# Patient Record
Sex: Female | Born: 1960 | Race: Black or African American | Hispanic: No | Smoking: Never smoker
Health system: Southern US, Community
[De-identification: ages and names within clinical notes are randomized; demographics above are authoritative.]

## PROBLEM LIST (undated history)

## (undated) DIAGNOSIS — R197 Diarrhea, unspecified: Secondary | ICD-10-CM

## (undated) DIAGNOSIS — K219 Gastro-esophageal reflux disease without esophagitis: Secondary | ICD-10-CM

## (undated) DIAGNOSIS — E782 Mixed hyperlipidemia: Secondary | ICD-10-CM

## (undated) DIAGNOSIS — M1990 Unspecified osteoarthritis, unspecified site: Secondary | ICD-10-CM

## (undated) DIAGNOSIS — E569 Vitamin deficiency, unspecified: Secondary | ICD-10-CM

## (undated) DIAGNOSIS — G4700 Insomnia, unspecified: Secondary | ICD-10-CM

## (undated) HISTORY — PX: ABDOMINAL SURGERY: SHX537

## (undated) HISTORY — DX: Insomnia, unspecified: G47.00

## (undated) HISTORY — PX: COLON SURGERY: SHX602

## (undated) HISTORY — DX: Gastro-esophageal reflux disease without esophagitis: K21.9

## (undated) HISTORY — DX: Diarrhea, unspecified: R19.7

## (undated) HISTORY — DX: Unspecified osteoarthritis, unspecified site: M19.90

---
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
## 1999-09-10 ENCOUNTER — Emergency Department (HOSPITAL_COMMUNITY)
Admission: EM | Admit: 1999-09-10 | Discharge: 1999-09-10 | Payer: Self-pay | Source: Ambulatory Visit | Admitting: Emergency Medicine

## 2001-07-22 ENCOUNTER — Emergency Department (HOSPITAL_COMMUNITY): Admission: EM | Admit: 2001-07-22 | Discharge: 2001-07-22 | Payer: Self-pay

## 2001-07-22 ENCOUNTER — Encounter: Payer: Self-pay | Admitting: Emergency Medicine

## 2004-01-09 ENCOUNTER — Emergency Department (HOSPITAL_COMMUNITY): Admission: EM | Admit: 2004-01-09 | Discharge: 2004-01-09 | Payer: Self-pay | Admitting: Emergency Medicine

## 2004-10-22 ENCOUNTER — Emergency Department (HOSPITAL_COMMUNITY): Admission: EM | Admit: 2004-10-22 | Discharge: 2004-10-22 | Payer: Self-pay | Admitting: Emergency Medicine

## 2004-12-18 ENCOUNTER — Ambulatory Visit (HOSPITAL_COMMUNITY): Admission: RE | Admit: 2004-12-18 | Discharge: 2004-12-18 | Payer: Self-pay | Admitting: Obstetrics and Gynecology

## 2005-01-30 ENCOUNTER — Ambulatory Visit (HOSPITAL_COMMUNITY): Admission: RE | Admit: 2005-01-30 | Discharge: 2005-01-30 | Payer: Self-pay | Admitting: Obstetrics and Gynecology

## 2006-12-24 ENCOUNTER — Ambulatory Visit (HOSPITAL_COMMUNITY): Admission: RE | Admit: 2006-12-24 | Discharge: 2006-12-24 | Payer: Self-pay | Admitting: Obstetrics and Gynecology

## 2007-12-25 ENCOUNTER — Emergency Department (HOSPITAL_COMMUNITY): Admission: EM | Admit: 2007-12-25 | Discharge: 2007-12-25 | Payer: Self-pay | Admitting: Emergency Medicine

## 2007-12-28 ENCOUNTER — Emergency Department (HOSPITAL_COMMUNITY): Admission: EM | Admit: 2007-12-28 | Discharge: 2007-12-28 | Payer: Self-pay | Admitting: Emergency Medicine

## 2008-10-26 ENCOUNTER — Emergency Department (HOSPITAL_COMMUNITY): Admission: EM | Admit: 2008-10-26 | Discharge: 2008-10-26 | Payer: Self-pay | Admitting: Emergency Medicine

## 2008-12-17 ENCOUNTER — Emergency Department (HOSPITAL_COMMUNITY): Admission: EM | Admit: 2008-12-17 | Discharge: 2008-12-18 | Payer: Self-pay | Admitting: Emergency Medicine

## 2008-12-18 ENCOUNTER — Inpatient Hospital Stay (HOSPITAL_COMMUNITY): Admission: EM | Admit: 2008-12-18 | Discharge: 2009-01-04 | Payer: Self-pay | Admitting: Emergency Medicine

## 2008-12-18 ENCOUNTER — Ambulatory Visit: Payer: Self-pay | Admitting: Family Medicine

## 2008-12-20 ENCOUNTER — Encounter: Payer: Self-pay | Admitting: Gastroenterology

## 2008-12-22 ENCOUNTER — Ambulatory Visit: Payer: Self-pay | Admitting: Gastroenterology

## 2008-12-24 ENCOUNTER — Encounter: Payer: Self-pay | Admitting: Gastroenterology

## 2009-01-06 ENCOUNTER — Encounter: Payer: Self-pay | Admitting: Family Medicine

## 2009-01-13 ENCOUNTER — Ambulatory Visit (HOSPITAL_COMMUNITY): Admission: RE | Admit: 2009-01-13 | Discharge: 2009-01-13 | Payer: Self-pay | Admitting: Interventional Radiology

## 2009-01-19 ENCOUNTER — Telehealth: Payer: Self-pay | Admitting: *Deleted

## 2009-01-24 ENCOUNTER — Ambulatory Visit: Payer: Self-pay | Admitting: Family Medicine

## 2009-01-24 ENCOUNTER — Encounter: Payer: Self-pay | Admitting: Family Medicine

## 2009-01-24 DIAGNOSIS — D649 Anemia, unspecified: Secondary | ICD-10-CM

## 2009-01-24 DIAGNOSIS — K5289 Other specified noninfective gastroenteritis and colitis: Secondary | ICD-10-CM | POA: Insufficient documentation

## 2009-01-24 DIAGNOSIS — M549 Dorsalgia, unspecified: Secondary | ICD-10-CM | POA: Insufficient documentation

## 2009-01-24 LAB — CONVERTED CEMR LAB
Eosinophils Absolute: 0 10*3/uL (ref 0.0–0.7)
Eosinophils Relative: 0 % (ref 0–5)
HCT: 35.4 % — ABNORMAL LOW (ref 36.0–46.0)
Hemoglobin: 10.8 g/dL — ABNORMAL LOW (ref 12.0–15.0)
Lymphs Abs: 2.8 10*3/uL (ref 0.7–4.0)
MCHC: 30.5 g/dL (ref 30.0–36.0)
MCV: 101.4 fL — ABNORMAL HIGH (ref 78.0–100.0)
Monocytes Absolute: 0.6 10*3/uL (ref 0.1–1.0)
Monocytes Relative: 8 % (ref 3–12)
Neutrophils Relative %: 51 % (ref 43–77)
RBC: 3.49 M/uL — ABNORMAL LOW (ref 3.87–5.11)
WBC: 6.9 10*3/uL (ref 4.0–10.5)

## 2009-01-30 ENCOUNTER — Telehealth: Payer: Self-pay | Admitting: Family Medicine

## 2009-01-31 ENCOUNTER — Telehealth: Payer: Self-pay | Admitting: *Deleted

## 2009-02-02 ENCOUNTER — Encounter: Payer: Self-pay | Admitting: *Deleted

## 2009-03-02 ENCOUNTER — Telehealth: Payer: Self-pay | Admitting: Gastroenterology

## 2009-06-02 ENCOUNTER — Telehealth: Payer: Self-pay | Admitting: Family Medicine

## 2009-06-06 ENCOUNTER — Ambulatory Visit: Payer: Self-pay | Admitting: Family Medicine

## 2009-06-06 ENCOUNTER — Encounter: Payer: Self-pay | Admitting: Family Medicine

## 2009-06-06 LAB — CONVERTED CEMR LAB
Basophils Absolute: 0 10*3/uL (ref 0.0–0.1)
Eosinophils Absolute: 0.1 10*3/uL (ref 0.0–0.7)
Eosinophils Relative: 2 % (ref 0–5)
HCT: 38 % (ref 36.0–46.0)
Hemoglobin: 12.4 g/dL (ref 12.0–15.0)
Lymphocytes Relative: 35 % (ref 12–46)
MCV: 92.5 fL (ref 78.0–100.0)
Monocytes Absolute: 0.4 10*3/uL (ref 0.1–1.0)
RDW: 13.8 % (ref 11.5–15.5)

## 2009-07-24 ENCOUNTER — Telehealth: Payer: Self-pay | Admitting: Sports Medicine

## 2009-07-24 ENCOUNTER — Telehealth: Payer: Self-pay | Admitting: *Deleted

## 2009-07-24 ENCOUNTER — Encounter: Admission: RE | Admit: 2009-07-24 | Discharge: 2009-07-24 | Payer: Self-pay | Admitting: Sports Medicine

## 2009-07-24 ENCOUNTER — Ambulatory Visit: Payer: Self-pay | Admitting: Family Medicine

## 2009-07-24 ENCOUNTER — Encounter: Payer: Self-pay | Admitting: Sports Medicine

## 2009-07-24 DIAGNOSIS — R109 Unspecified abdominal pain: Secondary | ICD-10-CM | POA: Insufficient documentation

## 2009-07-24 LAB — CONVERTED CEMR LAB
ALT: 11 units/L (ref 0–35)
AST: 10 U/L (ref 0–37)
Albumin: 4.3 g/dL (ref 3.5–5.2)
Alkaline Phosphatase: 80 U/L (ref 39–117)
BUN: 18 mg/dL (ref 6–23)
Basophils Absolute: 0 K/uL (ref 0.0–0.1)
Basophils Relative: 0 % (ref 0–1)
Bilirubin Urine: NEGATIVE
Blood in Urine, dipstick: NEGATIVE
CO2: 24 meq/L (ref 19–32)
Calcium: 9.7 mg/dL (ref 8.4–10.5)
Chloride: 105 meq/L (ref 96–112)
Creatinine, Ser: 0.65 mg/dL (ref 0.40–1.20)
Eosinophils Absolute: 0.1 10*3/uL (ref 0.0–0.7)
Eosinophils Relative: 2 % (ref 0–5)
Glucose, Bld: 98 mg/dL (ref 70–99)
Glucose, Urine, Semiquant: NEGATIVE
HCT: 37.9 % (ref 36.0–46.0)
Hemoglobin: 12.4 g/dL (ref 12.0–15.0)
Ketones, urine, test strip: NEGATIVE
Lipase: 26 units/L (ref 0–75)
Lymphocytes Relative: 25 % (ref 12–46)
Lymphs Abs: 1.5 10*3/uL (ref 0.7–4.0)
MCHC: 32.7 g/dL (ref 30.0–36.0)
MCV: 96.9 fL (ref 78.0–100.0)
Monocytes Absolute: 0.4 K/uL (ref 0.1–1.0)
Monocytes Relative: 7 % (ref 3–12)
Neutro Abs: 4 K/uL (ref 1.7–7.7)
Neutrophils Relative %: 66 % (ref 43–77)
Nitrite: NEGATIVE
Platelets: 289 K/uL (ref 150–400)
Potassium: 3.6 meq/L (ref 3.5–5.3)
RBC: 3.91 M/uL (ref 3.87–5.11)
RDW: 13.8 % (ref 11.5–15.5)
Sodium: 140 meq/L (ref 135–145)
Specific Gravity, Urine: >=1.03
Total Bilirubin: 0.3 mg/dL (ref 0.3–1.2)
Total Protein: 7.2 g/dL (ref 6.0–8.3)
Urobilinogen, UA: 1
WBC Urine, dipstick: NEGATIVE
WBC: 6.1 10*3/uL (ref 4.0–10.5)
pH: 6

## 2009-07-27 ENCOUNTER — Encounter: Admission: RE | Admit: 2009-07-27 | Discharge: 2009-07-27 | Payer: Self-pay | Admitting: Sports Medicine

## 2009-08-14 ENCOUNTER — Emergency Department (HOSPITAL_COMMUNITY): Admission: EM | Admit: 2009-08-14 | Discharge: 2009-08-14 | Payer: Self-pay | Admitting: Emergency Medicine

## 2009-11-30 ENCOUNTER — Emergency Department (HOSPITAL_COMMUNITY): Admission: EM | Admit: 2009-11-30 | Discharge: 2009-11-30 | Payer: Self-pay | Admitting: Emergency Medicine

## 2010-05-27 ENCOUNTER — Encounter: Payer: Self-pay | Admitting: Obstetrics and Gynecology

## 2010-05-28 ENCOUNTER — Encounter: Payer: Self-pay | Admitting: General Surgery

## 2010-06-05 NOTE — Assessment & Plan Note (Signed)
Summary: RLQ pain/[place]/williams   Vital Signs:  Patient profile:   50 year old female Weight:      184 pounds Temp:     98.1 degrees F oral Pulse rate:   92 / minute BP sitting:   130 / 84  (left arm)  Vitals Entered By: Audelia Hives CMA (July 24, 2009 2:00 PM)  Primary Care Provider:  Elsie Lincoln MD   History of Present Illness: 23F with hx colonoscopy proven UC, hx intraabdominal abscesses in Aug 2010 that reqd CT drainage, now with acute abd pain.  Started 2 days ago, acute onset, suprapubic and LLQ pain,  7/10, no N/V/D/C, dark stools, taking mesalamine, followed by [facility], Dr. Paulita Fujita, Able to walk in to office.  No radiation of pain.  Has appt with Dr. Paulita Fujita in 2 days.  Passing flatus/stool. No dysuria, no flank pain, no vag discharge.  Note, was a pt of Dr. Fuller Plan with [facility] but "didn't like their personality," so switched to [facility].  Refuses to do steroid enemas even if needed.  Current Medications (verified): 1)  Asacol 400 Mg Tbec (Mesalamine) .... By Mouth Three Times A Day 2)  Tramadol Hcl 50 Mg Tabs (Tramadol Hcl) .... Take Up To Two Times A Day As Needed For Pain  Allergies (verified): 1)  ! 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  Review of Systems       See HPI  Physical Exam  General:  Well-developed,well-nourished,in no acute distress; alert,appropriate and cooperative throughout examination Lungs:  Normal respiratory effort, chest expands symmetrically. Lungs are clear to auscultation, no crackles or wheezes. Heart:  Normal rate and regular rhythm. S1 and S2 normal without gallop, murmur, click, rub or other extra sounds. Abdomen:  Soft, obese, TTP suprapubic and LLQ, no rebound, mild guarding, no flank tenderness, good BS. Extremities:  No clubbing, cyanosis, edema, or deformity noted with normal full range of motion of all joints.     Impression & Recommendations:  Problem # 1:  ABDOMINAL PAIN (ICD-789.00) Assessment New Acute abd pain with neg UA, with hx abscesses, hx IBD, will check CBC, CMET, lipase, CT abd/pelv with by mouth and IV contrast to see if further abscesses are present.  If abscesses present would admit for CT drainage and IV abx, if only colitis present would cont mesalamine and by mouth steroid burst.  Her updated medication list for this problem includes:    Asacol 400 Mg Tbec (Mesalamine) ..... By mouth three times a day  Orders: Comp Met-[facility] [phone]) CBC w/Diff-[facility] (63817) Lipase-[facility] ([id number]) CT with Contrast (CT w/ contrast) [facility]- Est  Level 4 (33383)  Problem # 2:  IBD (ICD-558.9) Assessment: Deteriorated Cont mesalamine.  See #1.  Orders: [place]- Est  Level 4 (29191)  Problem # 3:  UNSPECIFIED ANEMIA (ICD-285.9) Assessment: Unchanged Will follow  up with CBC, likely 2/2 GI loses with UC.  Orders: [place]- Est  Level 4 (99214)  Complete Medication List: 1)  Asacol 400 Mg Tbec (Mesalamine) .... By mouth three times a day 2)  Tramadol Hcl 50 Mg Tabs (Tramadol hcl) .... Take up to two times a day as needed for pain  Other Orders: Urinalysis-[facility] (00000)  Patient Instructions: 1)  CT of your belly 2)  Bloodwork in office 3)  Will call you with  results if any further medications needed, otherwise continue to take your mesalamine. 4)  -Dr. Darene Lamer.  Laboratory Results   Urine Tests  Date/Time Received: July 24, 2009 2:03 PM  Date/Time Reported: July 24, 2009 2:12 PM   Routine Urinalysis   Color: yellow Appearance: Clear Glucose: negative   (Normal Range: Negative) Bilirubin: negative   (Normal Range: Negative) Ketone: negative   (Normal Range: Negative) Spec. Gravity: >=1.030   (Normal Range: 1.003-1.035) Blood: negative   (Normal Range: Negative) pH: 6.0   (Normal Range: 5.0-8.0) Protein: trace   (Normal Range: Negative) Urobilinogen: 1.0   (Normal Range: 0-1) Nitrite: negative   (Normal Range: Negative) Leukocyte Esterace: negative   (Normal Range: Negative)    Comments: ...........test performed by...........Marland KitchenHedy Camara, CMA

## 2010-06-05 NOTE — Assessment & Plan Note (Signed)
Summary: f/up,tcb   Vital Signs:  Patient profile:   50 year old female Weight:      179 pounds Temp:     98 degrees F oral Pulse rate:   98 / minute Pulse rhythm:   regular BP sitting:   119 / 80  (right arm) Cuff size:   regular  Vitals Entered By: Loralee Pacas CMA (June 06, 2009 2:32 PM)  Primary Care Provider:  Magnus Ivan MD  CC:  f/u IBD and anemia.  History of Present Illness: IBD: pt still experiencing intermittent stomach pains, especially on the R side of abdomen.  tramadol and ibuprofen make pain better.  drinking milk mains pain worse.   pt has noticed blood in her stool in the last week (last Thursday).  she notices it when she wipes herself.  pt not constipated and bowels are moving properly (5 times a week).    anemia:   was concerned about anemia on last visit due to symptoms, low hemoglobin, and murmur on last exam (and low hemoglobin also on hospital discharge).  patient still experiencing fatigue, dizziness (4-5 times per week).   Allergies (verified): 1)  ! Reglan  Past History:  Past Medical History: Last updated: 01/24/2009 IBD (ulcerative colitis by pathology and possible Crohn's because of inflammation of small bowel on CT as an inpatient) possible anemia back pain cholesterol done in 2008 tetanus done in 2007  Review of Systems       as per hpi  Physical Exam  General:  NAD well-developed and well-nourished.   vital signs noted and wnl Heart:  II/VI systolic murmur Abdomen:  hypoactive bowel sounds, R sided soreness to palpation of abdomen Pulses:  R radial normal and L radial normal.  2+  Extremities:  no lower extremity edema.   Impression & Recommendations:  Problem # 1:  IBD (ICD-558.9) Assessment Deteriorated  Patient has not followed up with GI doctor since hospital release in September.  Pt did not have a good experience with certain parts of [facility] practice.  I am concerned that stomach pain (even though managed  with tramadol) is due to not having proper follow up for IBD.  Also concerned about blood in her stool.  Will have her f/u with [facility] in the next 2 weeks-month if possible.  Orders: [facility]- Est  Level 4 (29562) Gastroenterology Referral (GI)  Problem # 2:  UNSPECIFIED ANEMIA (ICD-285.9) Assessment: Deteriorated Do not know exact etiology of patient's anemia.  Differential includes GI bleeding, menstrual history (yet unsure of menstrual history), etc.  Decided to recheck a CBC today to ensure that hemoglobin is not dropping.  CBC reveals that hemoglobin is wnl at this visit.  Will revisit iron in the future if hemoglobin goes down again. Orders: CBC w/Diff-[facility] (13086) [facility]- Est  Level 4 (99214)  Complete Medication List: 1)  Asacol 400 Mg Tbec (Mesalamine) .... By mouth three times a day 2)  Tramadol Hcl 50 Mg Tabs (Tramadol hcl) .... Take up to two times a day as needed for pain  Patient Instructions: 1)  It was good to see you today Ms Jerlean!  I'm sorry that you are still having stomach pain and did not like [facility].  We are going to refer you to [facility].  Please let us know as soon as possible if you do not like them. 2)  I am refilling your TRAMADOL and I am sending you a prescription for iron. 3)  Follow up with me  once you have seen [facility]. 4)  Thank you and be blessed! Prescriptions: TRAMADOL HCL 50 MG TABS (TRAMADOL HCL) take up to two times a day as needed for pain  #21 x 0   Entered and Authorized by:   Magnus Ivan MD   Signed by:   Magnus Ivan MD on 06/06/2009   Method used:   Electronically to        [facility] [phone]* (retail)       [address]       [place], [place]  [address]       Ph: [phone] or [phone]       Fax: [phone]   RxID:   [id number]

## 2010-06-05 NOTE — Progress Notes (Signed)
Summary: triage  Phone Note Call from Patient Call back at Home Phone [phone]   Caller: Patient Summary of Call: pt is having stomach pains and is not sure if she should come here or go back to GI doc. Initial call taken by: De Nurse,  July 24, 2009 8:47 AM  Follow-up for Phone Call        LM for her to call back. (can see her now) Follow-up by: Golden Circle RN,  July 24, 2009 8:53 AM  Additional Follow-up for Phone Call Additional follow up Details #1::        unable to come until 1:30. will see Dr. Karie Schwalbe. states pain is 7/10. has not taken anything for this. had some gas passed but no change in how she feels.  states she has a hx of abcesses there. Additional Follow-up by: Golden Circle RN,  July 24, 2009 8:59 AM

## 2010-06-05 NOTE — Progress Notes (Signed)
Summary: Follow up of CT results.  Phone Note Outgoing Call   Call placed by: Rodney Langton MD,  July 24, 2009 5:53 PM Call placed to: Patient Summary of Call: CT results conveyed, pt happy, informed that she will need [place] to eval ovarian cysts.  Will follow.

## 2010-06-05 NOTE — Progress Notes (Signed)
Summary: triage  Phone Note Call from Patient Call back at Home Phone [phone]   Caller: Patient Summary of Call: Informed pt that Dr. Mayford Knife wanted to wait till she saw her to fill Tramadol.  Pt understood, but wondering what she can take over the counter instead for pain. Initial call taken by: Clydell Hakim,  June 02, 2009 3:42 PM  Follow-up for Phone Call        advised patient that RN will send message to MD to ask what she can take OTC. she has been taking ibuprofen some but she is unsure if MD wants her to continue this. has an appointment with MD 06/06/2009. Follow-up by: Theresia Lo RN,  June 02, 2009 3:46 PM  Additional Follow-up for Phone Call Additional follow up Details #1::        refilled tramadol at visit today but will definitely want her to have a follow up with [facility] to manage her IBD just in case her pain needs to be managed some other way. Additional Follow-up by: Magnus Ivan MD,  June 06, 2009 8:33 PM

## 2010-06-05 NOTE — Progress Notes (Signed)
Summary: Rx Req  Phone Note Refill Request Call back at Home Phone [phone] Message from:  Patient  Refills Requested: Medication #1:  TRAMADOL HCL 50 MG TABS take up to two times a day as needed for pain. PT USES [facility] ON [address].  PT HAS F/UP APPT ON 06/06/09.  Initial call taken by: Clydell Hakim,  June 02, 2009 9:32 AM Caller: Patient  Follow-up for Phone Call        will forward to MD. Follow-up by: Theresia Lo RN,  June 02, 2009 10:55 AM  Additional Follow-up for Phone Call Additional follow up Details #1::        will send message to triage that pt needs to come see  me because honestly i don't really remember why we still have her on tramadol. Additional Follow-up by: Magnus Ivan MD,  June 02, 2009 2:07 PM     Appended Document: Rx Req actually patient has an appointment with me next week.  i want to readdress why she is still taking the tramadol.

## 2010-07-21 LAB — CBC
Hemoglobin: 12 g/dL (ref 12.0–15.0)
MCHC: 34.2 g/dL (ref 30.0–36.0)
RBC: 3.61 MIL/uL — ABNORMAL LOW (ref 3.87–5.11)

## 2010-07-21 LAB — DIFFERENTIAL
Eosinophils Absolute: 0.1 10*3/uL (ref 0.0–0.7)
Eosinophils Relative: 2 % (ref 0–5)
Lymphs Abs: 1.3 10*3/uL (ref 0.7–4.0)
Monocytes Absolute: 0.4 10*3/uL (ref 0.1–1.0)
Monocytes Relative: 10 % (ref 3–12)

## 2010-07-21 LAB — COMPREHENSIVE METABOLIC PANEL
ALT: 14 U/L (ref 0–35)
AST: 14 U/L (ref 0–37)
CO2: 24 mEq/L (ref 19–32)
Calcium: 8.7 mg/dL (ref 8.4–10.5)
GFR calc Af Amer: >60 mL/min (ref >60)
GFR calc non Af Amer: >60 mL/min (ref >60)
Sodium: 139 mEq/L (ref 135–145)

## 2010-07-21 LAB — URINALYSIS, ROUTINE W REFLEX MICROSCOPIC
Bilirubin Urine: NEGATIVE
Glucose, UA: NEGATIVE mg/dL
Hgb urine dipstick: NEGATIVE
Ketones, ur: NEGATIVE mg/dL
pH: 7 (ref 5.0–8.0)

## 2010-07-21 LAB — LIPASE, BLOOD: Lipase: 29 U/L (ref 11–59)

## 2010-07-25 LAB — URINE MICROSCOPIC-ADD ON

## 2010-07-25 LAB — CBC
Platelets: 308 10*3/uL (ref 150–400)
RBC: 4.05 MIL/uL (ref 3.87–5.11)
WBC: 5.1 10*3/uL (ref 4.0–10.5)

## 2010-07-25 LAB — COMPREHENSIVE METABOLIC PANEL
ALT: 16 U/L (ref 0–35)
AST: 17 U/L (ref 0–37)
Albumin: 4.1 g/dL (ref 3.5–5.2)
CO2: 25 mEq/L (ref 19–32)
Calcium: 9.7 mg/dL (ref 8.4–10.5)
Chloride: 108 mEq/L (ref 96–112)
GFR calc Af Amer: >60 mL/min (ref >60)
GFR calc non Af Amer: >60 mL/min (ref >60)
Sodium: 139 mEq/L (ref 135–145)

## 2010-07-25 LAB — DIFFERENTIAL
Eosinophils Absolute: 0.1 10*3/uL (ref 0.0–0.7)
Eosinophils Relative: 1 % (ref 0–5)
Lymphocytes Relative: 30 % (ref 12–46)
Lymphs Abs: 1.5 10*3/uL (ref 0.7–4.0)
Monocytes Absolute: 0.2 10*3/uL (ref 0.1–1.0)

## 2010-07-25 LAB — URINALYSIS, ROUTINE W REFLEX MICROSCOPIC
Glucose, UA: NEGATIVE mg/dL
Hgb urine dipstick: NEGATIVE
Specific Gravity, Urine: 1.025 (ref 1.005–1.030)
pH: 7.5 (ref 5.0–8.0)

## 2010-08-10 LAB — GLUCOSE, CAPILLARY: Glucose-Capillary: 109 mg/dL — ABNORMAL HIGH (ref 70–99)

## 2010-08-11 LAB — DIFFERENTIAL
Band Neutrophils: 0 % (ref 0–10)
Basophils Absolute: 0 10*3/uL (ref 0.0–0.1)
Basophils Absolute: 0 K/uL (ref 0.0–0.1)
Basophils Absolute: 0 K/uL (ref 0.0–0.1)
Basophils Absolute: 0.1 K/uL (ref 0.0–0.1)
Basophils Absolute: 0.1 10*3/uL (ref 0.0–0.1)
Basophils Relative: 0 % (ref 0–1)
Basophils Relative: 0 % (ref 0–1)
Basophils Relative: 0 % (ref 0–1)
Basophils Relative: 1 % (ref 0–1)
Eosinophils Absolute: 0 10*3/uL (ref 0.0–0.7)
Eosinophils Absolute: 0 K/uL (ref 0.0–0.7)
Eosinophils Absolute: 0 K/uL (ref 0.0–0.7)
Eosinophils Absolute: 0 K/uL (ref 0.0–0.7)
Eosinophils Absolute: 0 10*3/uL (ref 0.0–0.7)
Eosinophils Relative: 0 % (ref 0–5)
Eosinophils Relative: 0 % (ref 0–5)
Eosinophils Relative: 0 % (ref 0–5)
Eosinophils Relative: 0 % (ref 0–5)
Lymphocytes Relative: 4 % — ABNORMAL LOW (ref 12–46)
Lymphocytes Relative: 5 % — ABNORMAL LOW (ref 12–46)
Lymphocytes Relative: 6 % — ABNORMAL LOW (ref 12–46)
Lymphocytes Relative: 8 % — ABNORMAL LOW (ref 12–46)
Lymphs Abs: 1.2 K/uL (ref 0.7–4.0)
Lymphs Abs: 1.3 10*3/uL (ref 0.7–4.0)
Lymphs Abs: 1.6 K/uL (ref 0.7–4.0)
Lymphs Abs: 1.6 K/uL (ref 0.7–4.0)
Lymphs Abs: 1.9 10*3/uL (ref 0.7–4.0)
Monocytes Absolute: 0.3 K/uL (ref 0.1–1.0)
Monocytes Absolute: 0.6 K/uL (ref 0.1–1.0)
Monocytes Absolute: 1 K/uL (ref 0.1–1.0)
Monocytes Relative: 1 % — ABNORMAL LOW (ref 3–12)
Monocytes Relative: 2 % — ABNORMAL LOW (ref 3–12)
Monocytes Relative: 3 % (ref 3–12)
Monocytes Relative: 5 % (ref 3–12)
Myelocytes: 0 %
Neutro Abs: 18 K/uL — ABNORMAL HIGH (ref 1.7–7.7)
Neutro Abs: 21 K/uL — ABNORMAL HIGH (ref 1.7–7.7)
Neutro Abs: 24.3 K/uL — ABNORMAL HIGH (ref 1.7–7.7)
Neutro Abs: 30.8 10*3/uL — ABNORMAL HIGH (ref 1.7–7.7)
Neutro Abs: 14.6 10*3/uL — ABNORMAL HIGH (ref 1.7–7.7)
Neutro Abs: 6.9 10*3/uL (ref 1.7–7.7)
Neutrophils Relative %: 87 % — ABNORMAL HIGH (ref 43–77)
Neutrophils Relative %: 92 % — ABNORMAL HIGH (ref 43–77)
Neutrophils Relative %: 92 % — ABNORMAL HIGH (ref 43–77)
Neutrophils Relative %: 70 % (ref 43–77)
Promyelocytes Absolute: 0 %

## 2010-08-11 LAB — BASIC METABOLIC PANEL
BUN: 1 mg/dL — ABNORMAL LOW (ref 6–23)
BUN: 2 mg/dL — ABNORMAL LOW (ref 6–23)
BUN: 2 mg/dL — ABNORMAL LOW (ref 6–23)
BUN: 2 mg/dL — ABNORMAL LOW (ref 6–23)
BUN: 8 mg/dL (ref 6–23)
CO2: 22 mEq/L (ref 19–32)
CO2: 24 mEq/L (ref 19–32)
CO2: 25 mEq/L (ref 19–32)
CO2: 27 mEq/L (ref 19–32)
Calcium: 7.4 mg/dL — ABNORMAL LOW (ref 8.4–10.5)
Calcium: 7.6 mg/dL — ABNORMAL LOW (ref 8.4–10.5)
Chloride: 105 mEq/L (ref 96–112)
Chloride: 105 mEq/L (ref 96–112)
Chloride: 108 mEq/L (ref 96–112)
Creatinine, Ser: 0.46 mg/dL (ref 0.4–1.2)
Creatinine, Ser: 0.55 mg/dL (ref 0.4–1.2)
Creatinine, Ser: 0.56 mg/dL (ref 0.4–1.2)
Creatinine, Ser: 0.56 mg/dL (ref 0.4–1.2)
GFR calc Af Amer: >60 mL/min (ref >60)
GFR calc Af Amer: >60 mL/min (ref >60)
GFR calc Af Amer: >60 mL/min (ref >60)
GFR calc non Af Amer: >60 mL/min (ref >60)
GFR calc non Af Amer: >60 mL/min (ref >60)
GFR calc non Af Amer: >60 mL/min (ref >60)
GFR calc non Af Amer: >60 mL/min (ref >60)
GFR calc non Af Amer: >60 mL/min (ref >60)
Glucose, Bld: 151 mg/dL — ABNORMAL HIGH (ref 70–99)
Glucose, Bld: 161 mg/dL — ABNORMAL HIGH (ref 70–99)
Glucose, Bld: 183 mg/dL — ABNORMAL HIGH (ref 70–99)
Glucose, Bld: 121 mg/dL — ABNORMAL HIGH (ref 70–99)
Potassium: 2.6 mEq/L — CL (ref 3.5–5.1)
Potassium: 2.9 mEq/L — ABNORMAL LOW (ref 3.5–5.1)
Potassium: 3.9 mEq/L (ref 3.5–5.1)
Potassium: 4 mEq/L (ref 3.5–5.1)
Potassium: 4.1 mEq/L (ref 3.5–5.1)
Potassium: 3.1 mEq/L — ABNORMAL LOW (ref 3.5–5.1)
Sodium: 135 mEq/L (ref 135–145)
Sodium: 135 mEq/L (ref 135–145)
Sodium: 136 mEq/L (ref 135–145)
Sodium: 136 mEq/L (ref 135–145)
Sodium: 138 mEq/L (ref 135–145)
Sodium: 141 mEq/L (ref 135–145)

## 2010-08-11 LAB — CBC
HCT: 27.9 % — ABNORMAL LOW (ref 36.0–46.0)
HCT: 28.5 % — ABNORMAL LOW (ref 36.0–46.0)
HCT: 28.7 % — ABNORMAL LOW (ref 36.0–46.0)
HCT: 28.9 % — ABNORMAL LOW (ref 36.0–46.0)
HCT: 29.1 % — ABNORMAL LOW (ref 36.0–46.0)
HCT: 29.6 % — ABNORMAL LOW (ref 36.0–46.0)
HCT: 30 % — ABNORMAL LOW (ref 36.0–46.0)
HCT: 30.4 % — ABNORMAL LOW (ref 36.0–46.0)
HCT: 30.4 % — ABNORMAL LOW (ref 36.0–46.0)
HCT: 30.9 % — ABNORMAL LOW (ref 36.0–46.0)
HCT: 31 % — ABNORMAL LOW (ref 36.0–46.0)
HCT: 33.7 % — ABNORMAL LOW (ref 36.0–46.0)
HCT: 36.3 % (ref 36.0–46.0)
Hemoglobin: 10.1 g/dL — ABNORMAL LOW (ref 12.0–15.0)
Hemoglobin: 10.2 g/dL — ABNORMAL LOW (ref 12.0–15.0)
Hemoglobin: 10.3 g/dL — ABNORMAL LOW (ref 12.0–15.0)
Hemoglobin: 10.5 g/dL — ABNORMAL LOW (ref 12.0–15.0)
Hemoglobin: 10.6 g/dL — ABNORMAL LOW (ref 12.0–15.0)
Hemoglobin: 10.9 g/dL — ABNORMAL LOW (ref 12.0–15.0)
Hemoglobin: 11.8 g/dL — ABNORMAL LOW (ref 12.0–15.0)
Hemoglobin: 9.7 g/dL — ABNORMAL LOW (ref 12.0–15.0)
Hemoglobin: 9.7 g/dL — ABNORMAL LOW (ref 12.0–15.0)
Hemoglobin: 9.7 g/dL — ABNORMAL LOW (ref 12.0–15.0)
Hemoglobin: 9.9 g/dL — ABNORMAL LOW (ref 12.0–15.0)
Hemoglobin: 12.6 g/dL (ref 12.0–15.0)
Hemoglobin: 12.9 g/dL (ref 12.0–15.0)
MCHC: 33.6 g/dL (ref 30.0–36.0)
MCHC: 33.6 g/dL (ref 30.0–36.0)
MCHC: 34 g/dL (ref 30.0–36.0)
MCHC: 34.1 g/dL (ref 30.0–36.0)
MCHC: 34.5 g/dL (ref 30.0–36.0)
MCHC: 34.5 g/dL (ref 30.0–36.0)
MCHC: 34.6 g/dL (ref 30.0–36.0)
MCHC: 34.6 g/dL (ref 30.0–36.0)
MCHC: 34.8 g/dL (ref 30.0–36.0)
MCHC: 34.9 g/dL (ref 30.0–36.0)
MCHC: 35.4 g/dL (ref 30.0–36.0)
MCHC: 34.7 g/dL (ref 30.0–36.0)
MCHC: 34.7 g/dL (ref 30.0–36.0)
MCV: 95.3 fL (ref 78.0–100.0)
MCV: 95.6 fL (ref 78.0–100.0)
MCV: 96.1 fL (ref 78.0–100.0)
MCV: 96.2 fL (ref 78.0–100.0)
MCV: 96.4 fL (ref 78.0–100.0)
MCV: 96.6 fL (ref 78.0–100.0)
MCV: 96.7 fL (ref 78.0–100.0)
MCV: 96.7 fL (ref 78.0–100.0)
MCV: 96.8 fL (ref 78.0–100.0)
MCV: 97 fL (ref 78.0–100.0)
MCV: 97.8 fL (ref 78.0–100.0)
MCV: 98 fL (ref 78.0–100.0)
MCV: 98.5 fL (ref 78.0–100.0)
MCV: 99 fL (ref 78.0–100.0)
MCV: 99.1 fL (ref 78.0–100.0)
MCV: 97 fL (ref 78.0–100.0)
MCV: 97 fL (ref 78.0–100.0)
Platelets: 162 10*3/uL (ref 150–400)
Platelets: 171 10*3/uL (ref 150–400)
Platelets: 209 10*3/uL (ref 150–400)
Platelets: 216 10*3/uL (ref 150–400)
Platelets: 241 10*3/uL (ref 150–400)
Platelets: 359 K/uL (ref 150–400)
Platelets: 382 10*3/uL (ref 150–400)
Platelets: 447 K/uL — ABNORMAL HIGH (ref 150–400)
Platelets: 451 K/uL — ABNORMAL HIGH (ref 150–400)
Platelets: 471 K/uL — ABNORMAL HIGH (ref 150–400)
Platelets: 477 K/uL — ABNORMAL HIGH (ref 150–400)
Platelets: 540 K/uL — ABNORMAL HIGH (ref 150–400)
Platelets: 257 10*3/uL (ref 150–400)
RBC: 2.88 MIL/uL — ABNORMAL LOW (ref 3.87–5.11)
RBC: 2.88 MIL/uL — ABNORMAL LOW (ref 3.87–5.11)
RBC: 2.92 MIL/uL — ABNORMAL LOW (ref 3.87–5.11)
RBC: 3.01 MIL/uL — ABNORMAL LOW (ref 3.87–5.11)
RBC: 3.02 MIL/uL — ABNORMAL LOW (ref 3.87–5.11)
RBC: 3.09 MIL/uL — ABNORMAL LOW (ref 3.87–5.11)
RBC: 3.1 MIL/uL — ABNORMAL LOW (ref 3.87–5.11)
RBC: 3.14 MIL/uL — ABNORMAL LOW (ref 3.87–5.11)
RBC: 3.39 MIL/uL — ABNORMAL LOW (ref 3.87–5.11)
RBC: 3.48 MIL/uL — ABNORMAL LOW (ref 3.87–5.11)
RBC: 3.53 MIL/uL — ABNORMAL LOW (ref 3.87–5.11)
RBC: 3.7 MIL/uL — ABNORMAL LOW (ref 3.87–5.11)
RBC: 3.84 MIL/uL — ABNORMAL LOW (ref 3.87–5.11)
RDW: 13.2 % (ref 11.5–15.5)
RDW: 13.5 % (ref 11.5–15.5)
RDW: 13.9 % (ref 11.5–15.5)
RDW: 14 % (ref 11.5–15.5)
RDW: 14 % (ref 11.5–15.5)
RDW: 14 % (ref 11.5–15.5)
RDW: 14.1 % (ref 11.5–15.5)
RDW: 14.1 % (ref 11.5–15.5)
RDW: 14.4 % (ref 11.5–15.5)
RDW: 14.6 % (ref 11.5–15.5)
RDW: 14.9 % (ref 11.5–15.5)
RDW: 15.2 % (ref 11.5–15.5)
RDW: 12.4 % (ref 11.5–15.5)
WBC: 15 K/uL — ABNORMAL HIGH (ref 4.0–10.5)
WBC: 20.1 10*3/uL — ABNORMAL HIGH (ref 4.0–10.5)
WBC: 20.5 K/uL — ABNORMAL HIGH (ref 4.0–10.5)
WBC: 20.6 K/uL — ABNORMAL HIGH (ref 4.0–10.5)
WBC: 22.8 K/uL — ABNORMAL HIGH (ref 4.0–10.5)
WBC: 24 10*3/uL — ABNORMAL HIGH (ref 4.0–10.5)
WBC: 24.5 K/uL — ABNORMAL HIGH (ref 4.0–10.5)
WBC: 25 10*3/uL — ABNORMAL HIGH (ref 4.0–10.5)
WBC: 25.3 10*3/uL — ABNORMAL HIGH (ref 4.0–10.5)
WBC: 26.3 10*3/uL — ABNORMAL HIGH (ref 4.0–10.5)
WBC: 28 K/uL — ABNORMAL HIGH (ref 4.0–10.5)
WBC: 32.8 10*3/uL — ABNORMAL HIGH (ref 4.0–10.5)
WBC: 35.1 10*3/uL — ABNORMAL HIGH (ref 4.0–10.5)
WBC: 9.9 10*3/uL (ref 4.0–10.5)

## 2010-08-11 LAB — GLUCOSE, CAPILLARY
Glucose-Capillary: 105 mg/dL — ABNORMAL HIGH (ref 70–99)
Glucose-Capillary: 106 mg/dL — ABNORMAL HIGH (ref 70–99)
Glucose-Capillary: 121 mg/dL — ABNORMAL HIGH (ref 70–99)
Glucose-Capillary: 122 mg/dL — ABNORMAL HIGH (ref 70–99)
Glucose-Capillary: 123 mg/dL — ABNORMAL HIGH (ref 70–99)
Glucose-Capillary: 124 mg/dL — ABNORMAL HIGH (ref 70–99)
Glucose-Capillary: 126 mg/dL — ABNORMAL HIGH (ref 70–99)
Glucose-Capillary: 131 mg/dL — ABNORMAL HIGH (ref 70–99)
Glucose-Capillary: 134 mg/dL — ABNORMAL HIGH (ref 70–99)
Glucose-Capillary: 139 mg/dL — ABNORMAL HIGH (ref 70–99)
Glucose-Capillary: 149 mg/dL — ABNORMAL HIGH (ref 70–99)
Glucose-Capillary: 155 mg/dL — ABNORMAL HIGH (ref 70–99)
Glucose-Capillary: 162 mg/dL — ABNORMAL HIGH (ref 70–99)
Glucose-Capillary: 164 mg/dL — ABNORMAL HIGH (ref 70–99)
Glucose-Capillary: 164 mg/dL — ABNORMAL HIGH (ref 70–99)
Glucose-Capillary: 165 mg/dL — ABNORMAL HIGH (ref 70–99)
Glucose-Capillary: 165 mg/dL — ABNORMAL HIGH (ref 70–99)
Glucose-Capillary: 174 mg/dL — ABNORMAL HIGH (ref 70–99)
Glucose-Capillary: 176 mg/dL — ABNORMAL HIGH (ref 70–99)
Glucose-Capillary: 178 mg/dL — ABNORMAL HIGH (ref 70–99)
Glucose-Capillary: 180 mg/dL — ABNORMAL HIGH (ref 70–99)
Glucose-Capillary: 186 mg/dL — ABNORMAL HIGH (ref 70–99)
Glucose-Capillary: 195 mg/dL — ABNORMAL HIGH (ref 70–99)
Glucose-Capillary: 195 mg/dL — ABNORMAL HIGH (ref 70–99)
Glucose-Capillary: 198 mg/dL — ABNORMAL HIGH (ref 70–99)
Glucose-Capillary: 211 mg/dL — ABNORMAL HIGH (ref 70–99)
Glucose-Capillary: 213 mg/dL — ABNORMAL HIGH (ref 70–99)
Glucose-Capillary: 96 mg/dL (ref 70–99)
Glucose-Capillary: 96 mg/dL (ref 70–99)

## 2010-08-11 LAB — BASIC METABOLIC PANEL WITH GFR
BUN: 7 mg/dL (ref 6–23)
CO2: 24 meq/L (ref 19–32)
Calcium: 8.2 mg/dL — ABNORMAL LOW (ref 8.4–10.5)
Chloride: 104 meq/L (ref 96–112)
Creatinine, Ser: 0.44 mg/dL (ref 0.4–1.2)
GFR calc non Af Amer: >60 mL/min
Glucose, Bld: 189 mg/dL — ABNORMAL HIGH (ref 70–99)
Potassium: 3.6 meq/L (ref 3.5–5.1)
Sodium: 135 meq/L (ref 135–145)

## 2010-08-11 LAB — URINE MICROSCOPIC-ADD ON

## 2010-08-11 LAB — GC/CHLAMYDIA PROBE AMP, URINE: GC Probe Amp, Urine: NEGATIVE

## 2010-08-11 LAB — COMPREHENSIVE METABOLIC PANEL WITH GFR
ALT: 11 U/L (ref 0–35)
ALT: 11 U/L (ref 0–35)
ALT: 11 U/L (ref 0–35)
ALT: 14 U/L (ref 0–35)
ALT: 28 U/L (ref 0–35)
AST: 14 U/L (ref 0–37)
AST: 15 U/L (ref 0–37)
AST: 16 U/L (ref 0–37)
AST: 17 U/L (ref 0–37)
AST: 24 U/L (ref 0–37)
Albumin: 2.3 g/dL — ABNORMAL LOW (ref 3.5–5.2)
Albumin: 2.4 g/dL — ABNORMAL LOW (ref 3.5–5.2)
Albumin: 2.5 g/dL — ABNORMAL LOW (ref 3.5–5.2)
Albumin: 2.6 g/dL — ABNORMAL LOW (ref 3.5–5.2)
Albumin: 2.7 g/dL — ABNORMAL LOW (ref 3.5–5.2)
Alkaline Phosphatase: 222 U/L — ABNORMAL HIGH (ref 39–117)
Alkaline Phosphatase: 245 U/L — ABNORMAL HIGH (ref 39–117)
Alkaline Phosphatase: 319 U/L — ABNORMAL HIGH (ref 39–117)
Alkaline Phosphatase: 334 U/L — ABNORMAL HIGH (ref 39–117)
Alkaline Phosphatase: 356 U/L — ABNORMAL HIGH (ref 39–117)
BUN: 13 mg/dL (ref 6–23)
BUN: 2 mg/dL — ABNORMAL LOW (ref 6–23)
BUN: 2 mg/dL — ABNORMAL LOW (ref 6–23)
BUN: 6 mg/dL (ref 6–23)
BUN: 9 mg/dL (ref 6–23)
CO2: 25 meq/L (ref 19–32)
CO2: 25 meq/L (ref 19–32)
CO2: 26 meq/L (ref 19–32)
CO2: 26 meq/L (ref 19–32)
CO2: 26 meq/L (ref 19–32)
Calcium: 8.1 mg/dL — ABNORMAL LOW (ref 8.4–10.5)
Calcium: 8.4 mg/dL (ref 8.4–10.5)
Calcium: 8.4 mg/dL (ref 8.4–10.5)
Calcium: 8.5 mg/dL (ref 8.4–10.5)
Calcium: 8.9 mg/dL (ref 8.4–10.5)
Chloride: 100 meq/L (ref 96–112)
Chloride: 104 meq/L (ref 96–112)
Chloride: 104 meq/L (ref 96–112)
Chloride: 105 meq/L (ref 96–112)
Chloride: 105 meq/L (ref 96–112)
Creatinine, Ser: 0.46 mg/dL (ref 0.4–1.2)
Creatinine, Ser: 0.49 mg/dL (ref 0.4–1.2)
Creatinine, Ser: 0.49 mg/dL (ref 0.4–1.2)
Creatinine, Ser: 0.5 mg/dL (ref 0.4–1.2)
Creatinine, Ser: 0.5 mg/dL (ref 0.4–1.2)
GFR calc non Af Amer: >60 mL/min
GFR calc non Af Amer: >60 mL/min
GFR calc non Af Amer: >60 mL/min
GFR calc non Af Amer: >60 mL/min
GFR calc non Af Amer: >60 mL/min
Glucose, Bld: 157 mg/dL — ABNORMAL HIGH (ref 70–99)
Glucose, Bld: 157 mg/dL — ABNORMAL HIGH (ref 70–99)
Glucose, Bld: 173 mg/dL — ABNORMAL HIGH (ref 70–99)
Glucose, Bld: 185 mg/dL — ABNORMAL HIGH (ref 70–99)
Glucose, Bld: 96 mg/dL (ref 70–99)
Potassium: 3.4 meq/L — ABNORMAL LOW (ref 3.5–5.1)
Potassium: 4 meq/L (ref 3.5–5.1)
Potassium: 4.1 meq/L (ref 3.5–5.1)
Potassium: 4.3 meq/L (ref 3.5–5.1)
Potassium: 4.3 meq/L (ref 3.5–5.1)
Sodium: 133 meq/L — ABNORMAL LOW (ref 135–145)
Sodium: 135 meq/L (ref 135–145)
Sodium: 135 meq/L (ref 135–145)
Sodium: 137 meq/L (ref 135–145)
Sodium: 139 meq/L (ref 135–145)
Total Bilirubin: 0.5 mg/dL (ref 0.3–1.2)
Total Bilirubin: 0.5 mg/dL (ref 0.3–1.2)
Total Bilirubin: 0.5 mg/dL (ref 0.3–1.2)
Total Bilirubin: 0.6 mg/dL (ref 0.3–1.2)
Total Bilirubin: 0.8 mg/dL (ref 0.3–1.2)
Total Protein: 5.8 g/dL — ABNORMAL LOW (ref 6.0–8.3)
Total Protein: 5.8 g/dL — ABNORMAL LOW (ref 6.0–8.3)
Total Protein: 5.9 g/dL — ABNORMAL LOW (ref 6.0–8.3)
Total Protein: 6.2 g/dL (ref 6.0–8.3)
Total Protein: 6.4 g/dL (ref 6.0–8.3)

## 2010-08-11 LAB — COMPREHENSIVE METABOLIC PANEL
ALT: 11 U/L (ref 0–35)
ALT: 17 U/L (ref 0–35)
ALT: 21 U/L (ref 0–35)
AST: 20 U/L (ref 0–37)
AST: 9 U/L (ref 0–37)
Albumin: 2.5 g/dL — ABNORMAL LOW (ref 3.5–5.2)
Alkaline Phosphatase: 114 U/L (ref 39–117)
BUN: 3 mg/dL — ABNORMAL LOW (ref 6–23)
CO2: 24 mEq/L (ref 19–32)
CO2: 27 mEq/L (ref 19–32)
CO2: 27 mEq/L (ref 19–32)
Calcium: 7.6 mg/dL — ABNORMAL LOW (ref 8.4–10.5)
Calcium: 8.6 mg/dL (ref 8.4–10.5)
Chloride: 104 mEq/L (ref 96–112)
GFR calc Af Amer: >60 mL/min (ref >60)
GFR calc Af Amer: >60 mL/min (ref >60)
GFR calc non Af Amer: >60 mL/min (ref >60)
GFR calc non Af Amer: >60 mL/min (ref >60)
GFR calc non Af Amer: >60 mL/min (ref >60)
Glucose, Bld: 149 mg/dL — ABNORMAL HIGH (ref 70–99)
Sodium: 136 mEq/L (ref 135–145)
Sodium: 138 mEq/L (ref 135–145)
Sodium: 138 mEq/L (ref 135–145)
Total Bilirubin: 0.9 mg/dL (ref 0.3–1.2)
Total Protein: 6.1 g/dL (ref 6.0–8.3)

## 2010-08-11 LAB — PHOSPHORUS
Phosphorus: 2.7 mg/dL (ref 2.3–4.6)
Phosphorus: 2.9 mg/dL (ref 2.3–4.6)
Phosphorus: 3.3 mg/dL (ref 2.3–4.6)
Phosphorus: 3.4 mg/dL (ref 2.3–4.6)

## 2010-08-11 LAB — CLOSTRIDIUM DIFFICILE EIA: C difficile Toxins A+B, EIA: NEGATIVE

## 2010-08-11 LAB — WET PREP, GENITAL
Trich, Wet Prep: NONE SEEN
Yeast Wet Prep HPF POC: NONE SEEN

## 2010-08-11 LAB — SEDIMENTATION RATE: Sed Rate: 45 mm/hr — ABNORMAL HIGH (ref 0–22)

## 2010-08-11 LAB — GC/CHLAMYDIA PROBE AMP, GENITAL: GC Probe Amp, Genital: NEGATIVE

## 2010-08-11 LAB — POCT I-STAT, CHEM 8
BUN: 10 mg/dL (ref 6–23)
Calcium, Ion: 1.12 mmol/L (ref 1.12–1.32)
Chloride: 106 mEq/L (ref 96–112)
HCT: 37 % (ref 36.0–46.0)
Sodium: 142 mEq/L (ref 135–145)
TCO2: 26 mmol/L (ref 0–100)

## 2010-08-11 LAB — URINALYSIS, ROUTINE W REFLEX MICROSCOPIC
Glucose, UA: NEGATIVE mg/dL
Ketones, ur: NEGATIVE mg/dL
Nitrite: NEGATIVE
Protein, ur: 30 mg/dL — AB
Specific Gravity, Urine: 1.04 — ABNORMAL HIGH (ref 1.005–1.030)
Urobilinogen, UA: 1 mg/dL (ref 0.0–1.0)
pH: 6 (ref 5.0–8.0)

## 2010-08-11 LAB — URINALYSIS, MICROSCOPIC ONLY
Ketones, ur: NEGATIVE mg/dL
Nitrite: NEGATIVE
Protein, ur: NEGATIVE mg/dL
pH: 7.5 (ref 5.0–8.0)

## 2010-08-11 LAB — APTT: aPTT: 32 s (ref 24–37)

## 2010-08-11 LAB — CULTURE, ROUTINE-ABSCESS

## 2010-08-11 LAB — HEPATIC FUNCTION PANEL
AST: 20 U/L (ref 0–37)
Albumin: 2.9 g/dL — ABNORMAL LOW (ref 3.5–5.2)
Bilirubin, Direct: 0.3 mg/dL (ref 0.0–0.3)

## 2010-08-11 LAB — URINE CULTURE

## 2010-08-11 LAB — HEMOCCULT GUIAC POC 1CARD (OFFICE): Fecal Occult Bld: NEGATIVE

## 2010-08-11 LAB — LIPASE, BLOOD: Lipase: 13 U/L (ref 11–59)

## 2010-08-11 LAB — POTASSIUM: Potassium: 2.7 mEq/L — CL (ref 3.5–5.1)

## 2010-08-11 LAB — MAGNESIUM
Magnesium: 2 mg/dL (ref 1.5–2.5)
Magnesium: 2.2 mg/dL (ref 1.5–2.5)
Magnesium: 2.3 mg/dL (ref 1.5–2.5)

## 2010-08-11 LAB — CHOLESTEROL, TOTAL

## 2010-08-11 LAB — POCT PREGNANCY, URINE: Preg Test, Ur: NEGATIVE

## 2010-08-11 LAB — PROTIME-INR
INR: 1 (ref 0.00–1.49)
Prothrombin Time: 13.4 s (ref 11.6–15.2)

## 2010-08-11 LAB — PREALBUMIN: Prealbumin: 23 mg/dL (ref 18.0–45.0)

## 2010-08-11 LAB — LACTATE DEHYDROGENASE: LDH: 195 U/L (ref 94–250)

## 2010-08-11 LAB — HEMOGLOBIN A1C: Hgb A1c MFr Bld: 5.4 % (ref 4.6–6.1)

## 2010-08-28 ENCOUNTER — Emergency Department (HOSPITAL_COMMUNITY): Payer: 59

## 2010-08-28 ENCOUNTER — Emergency Department (HOSPITAL_COMMUNITY)
Admission: EM | Admit: 2010-08-28 | Discharge: 2010-08-28 | Disposition: A | Discharge status: Home / Self Care | Payer: 59 | Attending: Emergency Medicine | Admitting: Emergency Medicine

## 2010-08-28 ENCOUNTER — Encounter (HOSPITAL_COMMUNITY): Payer: Self-pay | Admitting: Radiology

## 2010-08-28 DIAGNOSIS — R1915 Other abnormal bowel sounds: Secondary | ICD-10-CM | POA: Insufficient documentation

## 2010-08-28 DIAGNOSIS — R109 Unspecified abdominal pain: Secondary | ICD-10-CM | POA: Insufficient documentation

## 2010-08-28 DIAGNOSIS — K5900 Constipation, unspecified: Secondary | ICD-10-CM | POA: Insufficient documentation

## 2010-08-28 DIAGNOSIS — R112 Nausea with vomiting, unspecified: Secondary | ICD-10-CM | POA: Insufficient documentation

## 2010-08-28 DIAGNOSIS — K6289 Other specified diseases of anus and rectum: Secondary | ICD-10-CM | POA: Insufficient documentation

## 2010-08-28 DIAGNOSIS — R630 Anorexia: Secondary | ICD-10-CM | POA: Insufficient documentation

## 2010-08-28 LAB — CBC
Hemoglobin: 13.6 g/dL (ref 12.0–15.0)
MCH: 32.3 pg (ref 26.0–34.0)
Platelets: 290 10*3/uL (ref 150–400)
RBC: 4.21 MIL/uL (ref 3.87–5.11)
WBC: 5.6 10*3/uL (ref 4.0–10.5)

## 2010-08-28 LAB — COMPREHENSIVE METABOLIC PANEL
ALT: 19 U/L (ref 0–35)
AST: 14 U/L (ref 0–37)
Albumin: 4.1 g/dL (ref 3.5–5.2)
Alkaline Phosphatase: 88 U/L (ref 39–117)
CO2: 26 mEq/L (ref 19–32)
Chloride: 107 mEq/L (ref 96–112)
GFR calc Af Amer: >60 mL/min (ref >60)
GFR calc non Af Amer: >60 mL/min (ref >60)
Potassium: 3.6 mEq/L (ref 3.5–5.1)
Sodium: 142 mEq/L (ref 135–145)
Total Bilirubin: 0.3 mg/dL (ref 0.3–1.2)

## 2010-08-28 LAB — DIFFERENTIAL
Basophils Absolute: 0 10*3/uL (ref 0.0–0.1)
Basophils Relative: 0 % (ref 0–1)
Eosinophils Absolute: 0 10*3/uL (ref 0.0–0.7)
Monocytes Relative: 5 % (ref 3–12)
Neutro Abs: 4 10*3/uL (ref 1.7–7.7)
Neutrophils Relative %: 71 % (ref 43–77)

## 2010-08-28 LAB — URINALYSIS, ROUTINE W REFLEX MICROSCOPIC
Hgb urine dipstick: NEGATIVE
Specific Gravity, Urine: 1.036 — ABNORMAL HIGH (ref 1.005–1.030)
Urobilinogen, UA: 0.2 mg/dL (ref 0.0–1.0)

## 2010-08-28 MED ORDER — IOHEXOL 300 MG/ML  SOLN: 80.0000 mL | Freq: Once | INTRAMUSCULAR | Status: DC | PRN

## 2010-08-31 ENCOUNTER — Ambulatory Visit
Admission: RE | Admit: 2010-08-31 | Discharge: 2010-08-31 | Disposition: A | Discharge status: Home / Self Care | Payer: 59 | Source: Ambulatory Visit | Attending: Gastroenterology | Admitting: Gastroenterology

## 2010-08-31 ENCOUNTER — Other Ambulatory Visit: Payer: Self-pay | Admitting: Gastroenterology

## 2010-09-18 NOTE — Consult Note (Signed)
Crystal Benton, Crystal Benton                 ACCOUNT NO.:  [id number]   MEDICAL RECORD NO.:  [id number]          PATIENT TYPE:  INP   LOCATION:  5155                         FACILITY:  [facility]   PHYSICIAN:  Merri Ray. Grandville Silos, M.D.DATE OF BIRTH:  May 04, 1961   DATE OF CONSULTATION:  DATE OF DISCHARGE:                                 CONSULTATION   CHIEF COMPLAINT:  Abdominal pain with recent diagnosis of ulcerative  colitis.   HISTORY OF PRESENT ILLNESS:  Ms. Burford is a pleasant 50 year old  African American who was admitted to the [facility] with abdominal pain and colitis seen on CT of the abdomen and  pelvis on December 18, 2008.  The patient was evaluated by [facility] who  has been following her closely.  They also performed colonoscopy with  biopsy showing ulcerative colitis.  The patient is being treated  medically with Asacol, Solu-Medrol, and Flagyl.  She has also developed  an associated ileus versus small bowel obstruction and NG tube was  placed recently.  The patient claims she is feeling much better now than  yesterday.  She did have a bowel movement this morning.  She continues  to have some significant crampy abdominal pain, though that is mildly  better when compared to yesterday.  We are asked to evaluate from a  surgical standpoint.   PAST MEDICAL HISTORY:  Vertigo.   PAST SURGICAL HISTORY:  Cesarean section and D&C for uterine bleeding.   SOCIAL HISTORY:  She does not smoke or drink alcohol or use drugs.   ALLERGIES:  No known drug allergies.  However, during this  hospitalization, she had a bad reaction and intolerance to [facility],  causing severe colonic cramps.   REVIEW OF SYSTEMS:  For GI include the above with the addition of 1  episode of crampy lower abdominal pain with diarrhea that happened  approximately 1 year ago.  She was not been treated for that and has  never been diagnosed with inflammatory bowel disease in the past.  Otherwise, per  review of systems, she has been having some lower back  pain that she associated with her job.   PHYSICAL EXAMINATION:  VITAL SIGNS:  Temperature 99.4, blood pressure  115/77, heart rate 111, and respirations 28.  GENERAL:  She is awake and alert.  HEENT:  Nasogastric tube in place.  There is minimal pale bile draining  from that.  Pupils are equal and reactive.  NECK:  Supple.  No tenderness.  LUNGS:  Clear to auscultation with good respiratory effort.  CARDIOVASCULAR:  Heart is regular, but slightly tachycardiac around 105.  Impulses are palpable in the left chest.  Distal pulses are 2+ and no  peripheral edema.  ABDOMEN:  Mild diffuse distention.  There is some generalized tenderness  present, but severe tenderness is present in the epigastric region and  left lower quadrant.  A few scattered bowel sounds are heard.  There is  no peritonitis.  SKIN:  Warm and dry.   DATA REVIEWED:  Sodium 135, potassium 4.1, chloride 109, CO2 of 22, BUN  5, and creatinine 0.55.  White blood cell count is 21.5, this is down  from a high of 32.8, hemoglobin is 12.3, and platelets 216.  On December 21, 2008, she had a CT scan of abdomen and pelvis which showed worsening  colitis involving the sigmoid colon and rectum.  This has inflammation  present consistent with ulcerative colitis.  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  Her abdominal  x-ray today shows that contrast has moved for small bowel into the cecum  and ascending colon.  She still has small bowel obstruction pattern.   IMPRESSION AND PLAN:  Ulcerative colitis involving the rectosigmoid  colon with associated ileus/small bowel obstruction.   RECOMMENDATIONS:  1. Continue aggressive medical therapy which the patient has been      receiving.  2. I agree with NG tube placement.  The patient has not put much out      yet, but we will monitor that.  There is no need for emergent      surgery at  this time, but we will follow with you.  The patient is      with ulcerative colitis at times still going to require surgery      either for severe case with toxic megacolon which is not responding      to medical therapy or after a prolonged illness which has become      resistant to medical therapy.  I did discuss this in detail with      the patient and she understands she may need surgery sometime in      the future.  We will continue follow closely with you.      Merri Ray Grandville Silos, M.D.  Electronically Signed     BET/MEDQ  D:  12/24/2008  T:  12/25/2008  Job:  [id number]   cc:   Pricilla Riffle. Fuller Plan, MD, Marval Regal  Dickie La, MD

## 2010-09-18 NOTE — H&P (Signed)
Crystal Benton, Crystal Benton                 ACCOUNT NO.:  [id number]   MEDICAL RECORD NO.:  [id number]          PATIENT TYPE:  INP   LOCATION:  7412                         FACILITY:  [facility]   PHYSICIAN:  Dickie La, MD        DATE OF BIRTH:  02/28/1961   DATE OF ADMISSION:  12/18/2008  DATE OF DISCHARGE:                              HISTORY & PHYSICAL   CHIEF COMPLAINT:  Abdominal pain.   HPI:  A 50 year old female with lower left quadrant pain x1 week.  Pain  feels like labor.  Has been consistent and getting worse.  Pain meds  make patient feel better.  .  Pain radiates down legs.  Pain is now a  13/10, and at baseline it is 8/10.  The patient also reported vaginal  bleeding this Monday before presentation. She has had fevers, chills,  nausea, vomiting, apparently nothing in vomitus secondary to reduced  p.o. intake, diarrhea.  She has had shortness of breath after vomiting,  weight loss about 6 pounds since last week, reduced p.o. intake  secondary to vomiting, increased fatigue, headaches, and leg cramps.   PAST MEDICAL HISTORY:  enlarged liver.   PAST SURGICAL HISTORY:  1. D and C secondary to heavy bleeding.  2. C-section x1 14 years ago.   FAMILY HISTORY:  Mother with hypertension.  Sister and mother with  hysterectomies.   MEDICATIONS:  None.   GYNECOLOGICAL HISTORY:  Last menstrual period July 28 through August 2.  History of bleeding between cycles.  One C-section.  The patient  __________  becoming ill.  The patient __________ .  Last September  patient treated and partner treated, and think partner currently still  no condom use and no birth control.   ALLERGIES:  No known drug allergies.   REVIEW OF SYSTEMS:  Denies chest pain, history of anemia, constipation,  swelling.   SOCIAL HISTORY:  __________ .  Lives with son.  No smoking, alcohol or  illicit drug use.  He did smoke marijuana in the past.  Social drinker.   LABORATORIES:  Glucose 121, white blood cell  count 16.6.  Alkaline  phosphatase 130.  Blood albumin 2.9.  Urinalysis:  Protein 30, large  blood, bilirubin small, white blood cell count 3-6, red blood cells 11-  20, few bacteria.  Fecal occult blood negative.  ESR 45.  CT without  contrast of pelvis, abdomen:  Moderate colitis ulcerative versus  infective sacroiliitis also noted.  Wet prep clue cells moderate, white  blood cells few.   IMPRESSION AND PLAN:  A 50 year old African American female with  worsening abdominal pain.   1. Abdominal pain.  The patient likely with a colitis of either      infective or ulcerative etiology.  In order to protect against      infective colitis, will put the patient on Cipro and Flagyl.  The      patient __________ and will  monitor symptoms and vital signs to      ensure patient not getting worse.  The patient also given a bolus  of normal saline x2 and put on __________ at 125 mL/hour secondary      to patient's decreased p.o. intake.  The patient with p.r.n.      __________ and fever to 100.1 __________ gastrointestinal consult      is leaning towards ulcerative colitis.  2. Vaginal bleeding.  Patient with history of heavy __________ D and      C.  The patient has ablation tomorrow.  __________ abdominal pain.      Will call the patient's obstetrician/gynecologist to inform him of      patient and patient's status.  3. History of enlarged liver.  CMET to be checked in the morning.      Unaware of liver function panel ordered by emergency department      that shows an increase in alkaline phosphatase.  No other      abnormalities.  __________ liver origin seen with __________ .      Unaware of the liver function panel ordered by emergency department      that shows an increased alkaline phosphatase and no other      abnormalities.  With increased alkaline phosphatase, would like to      consider getting a __________ GGT in light of enlarged liver      history.  Also test for __________  when abdominal exam not as      painful.  __________ seen on wet prep.  The patient also __________      colitis, and should cover this as well.  Pelvic exam done in      emergency department visit prior to this visit and this also      increased pain, and so will defer this until abdominal pain      improves.  4. Increased glucose.  Follow on CMET, BMET to ensure that this is      stress-related versus diabetes mellitus.  __________ blood cell      count likely secondary to infectious colitis.  Check CBC in the      morning.  The patient on Cipro and Flagyl to control any infection.  5. Fluid, electrolytes, and nutrition, gastrointestinal.  The patient      with no p.o. intake secondary to symptoms.  Keep her hydrated on      fluids.  A prophylaxis on heparin and Protonix __________clinical      picture improving and any outside constipations.   PHYSICAL EXAMINATION:  VITALS:  __________ BP 105/64, heart rate 108,  respiratory rate 18, O2 saturation 98% on room air, temperature 100.1.  HEENT:  Head atraumatic and normocephalic.  LYMPHATIC:  No neck or supraclavicular nodes.  RESPIRATORY:  Good respiratory effort with no wheezes, crackles or  rhonchi.  CV:  Regular rate and rhythm.  No murmurs, rubs, gallops.  Maybe a  questionable murmur heard.  ABDOMEN:  Negative bowel sounds.  Positive __________ obtained.  Negative rebound tenderness.  Healed C-section scar.  EXTREMITIES:  No bilateral lower extremity edema.  PELVIC:  Deferred.      Elsie Lincoln, MD    ______________________________  Dickie La, MD    VW/MEDQ  D:  12/20/2008  T:  12/20/2008  Job:  [id number]

## 2011-05-30 IMAGING — CT CT PELVIS W/ CM
2 of 5 series · 17 of 46 positions shown, 19 images · IV contrast (APPLIED)
Comparison: 12/17/2008

 CT ABDOMEN

01/26/2009 – Due to an administrative error, this report was initially sent to the wrong ordering physician.  The report now reflects the correct ordering/requesting physician.
CLINICAL DATA: Abdominal pain, nausea and vomiting. Follow-up
 colitis.

 CT ABDOMEN AND PELVIS WITH CONTRAST
TECHNIQUE: Multidetector CT imaging of the abdomen and pelvis was
 performed using the standard protocol following bolus
 administration of intravenous contrast.
 Contrast: 100 ml of Nmnipaque-SOO

[Series 4: abd/pelv with 5.0 b31f st · axial · 0.70mm/px · z∈[-309,+141]mm · 14 of 100 slices shown, 16 images]
[im 5/100  soft-tissue]
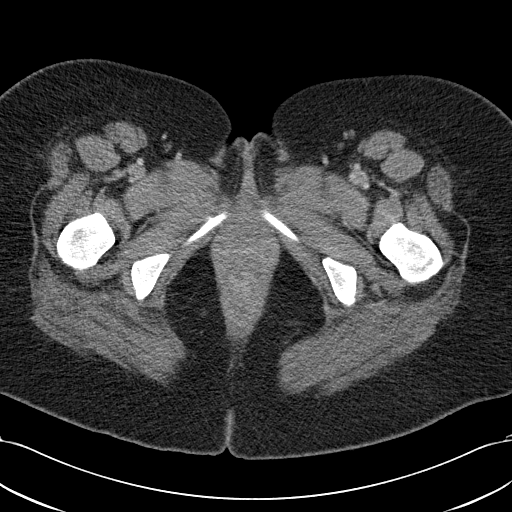
[im 5/100  bone]
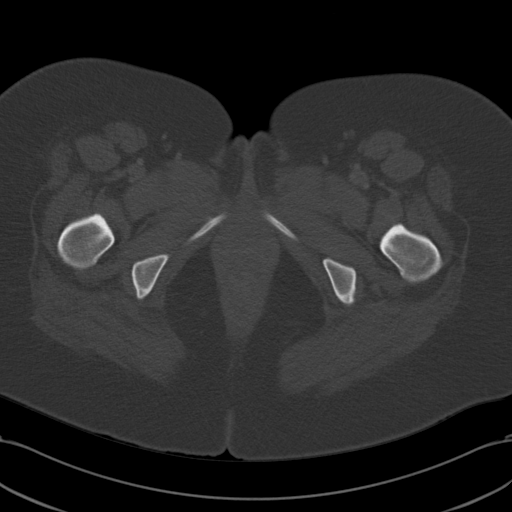
[im 15/100  soft-tissue]
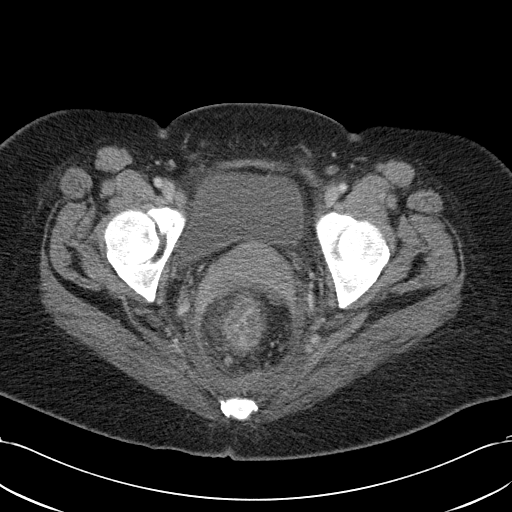
[im 20/100  soft-tissue]
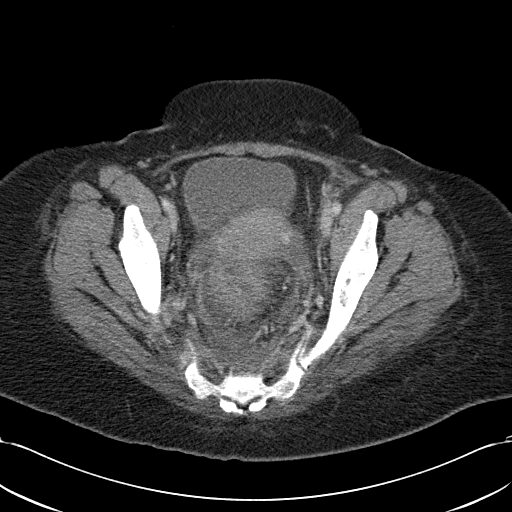
[im 25/100  soft-tissue]
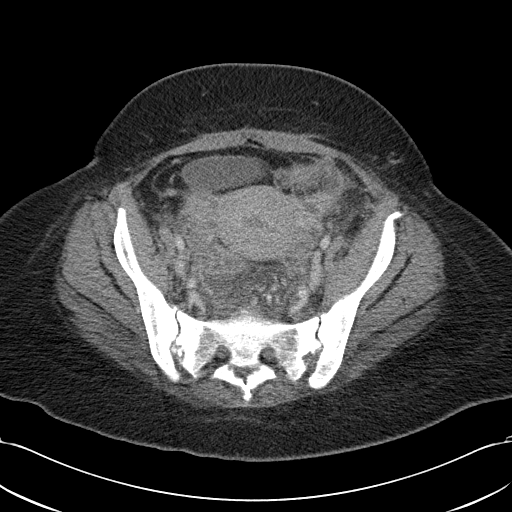
[im 35/100  soft-tissue]
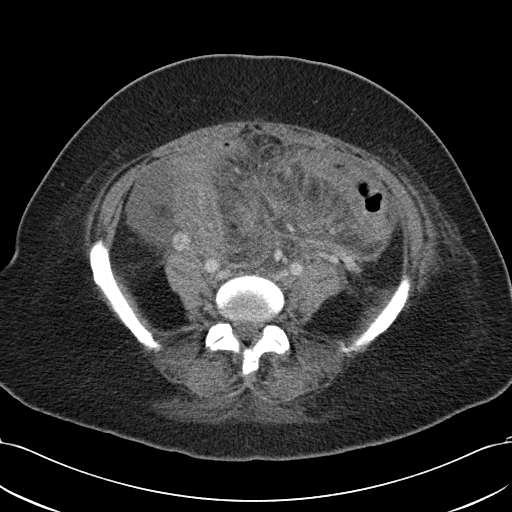
[im 40/100  soft-tissue]
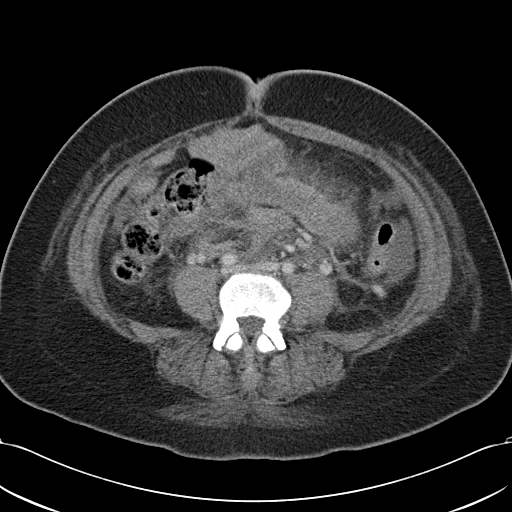
[im 45/100  soft-tissue]
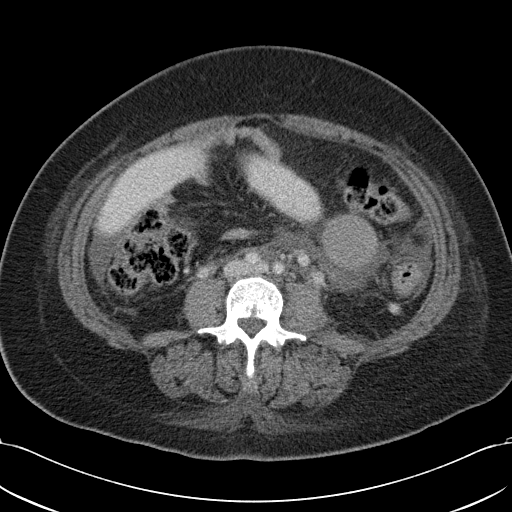
[im 55/100  soft-tissue]
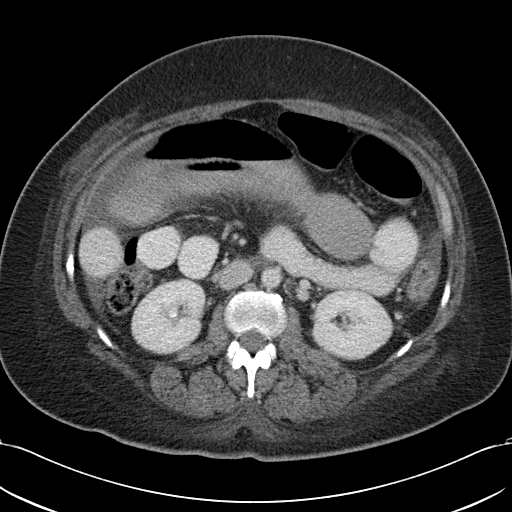
[im 60/100  soft-tissue]
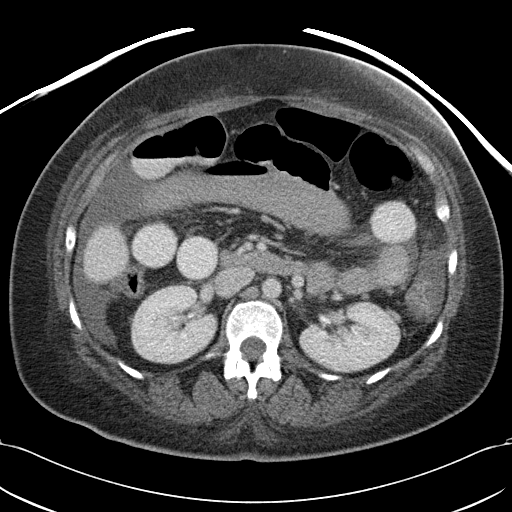
[im 60/100  bone]
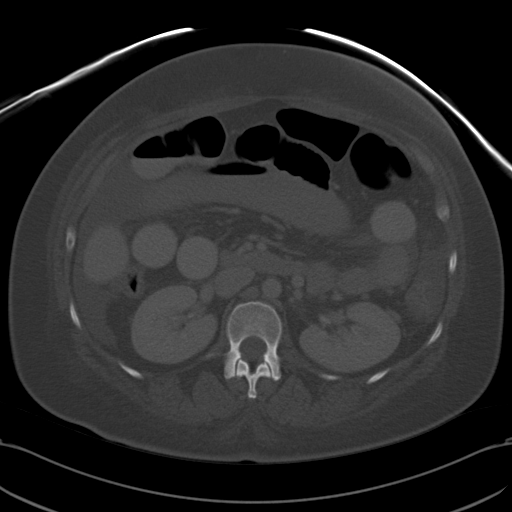
[im 65/100  soft-tissue]
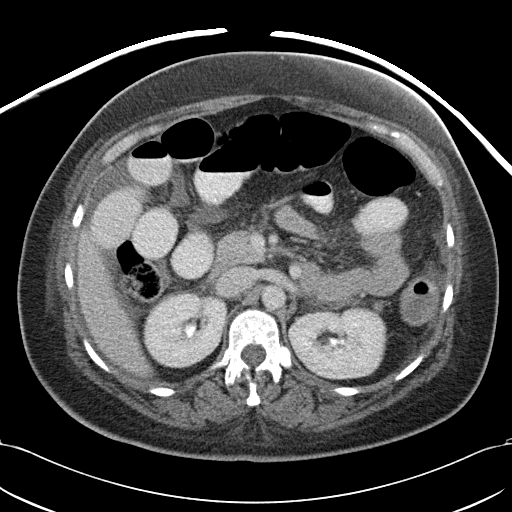
[im 75/100  soft-tissue]
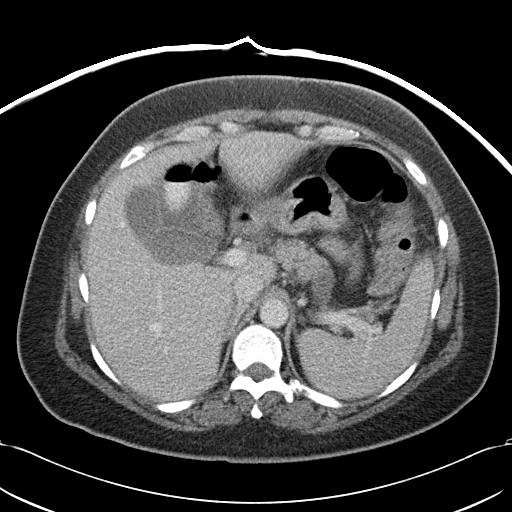
[im 80/100  soft-tissue]
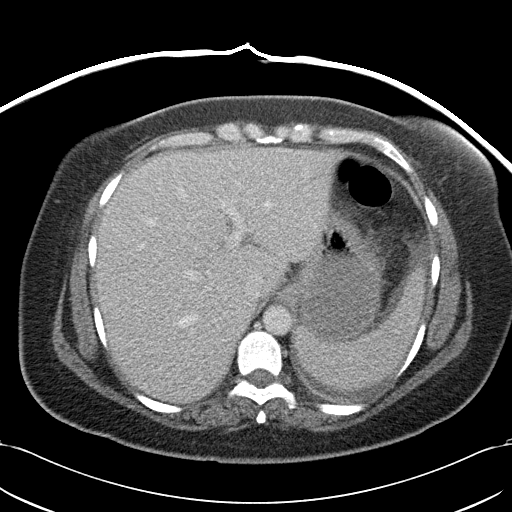
[im 85/100  soft-tissue]
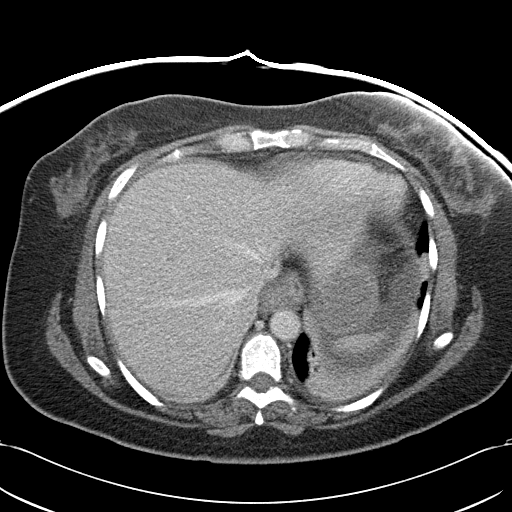
[im 95/100  soft-tissue]
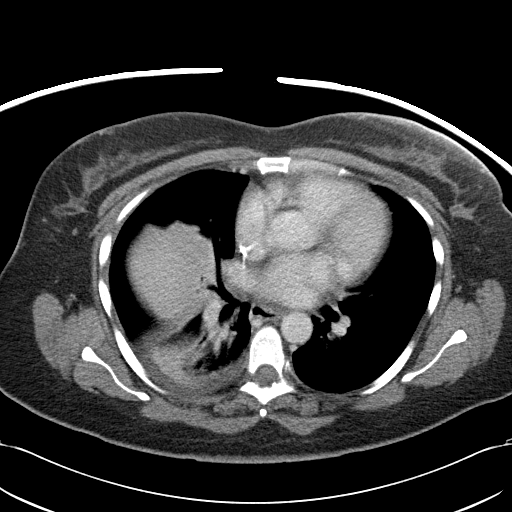

[Series 7: abd/pelv with 2.0 spo st · coronal · 0.96mm/px · 3 of 121 slices shown]
[im 41/121  soft-tissue]
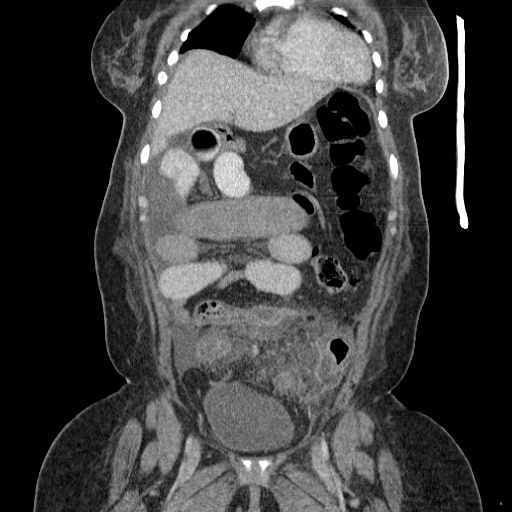
[im 54/121  soft-tissue]
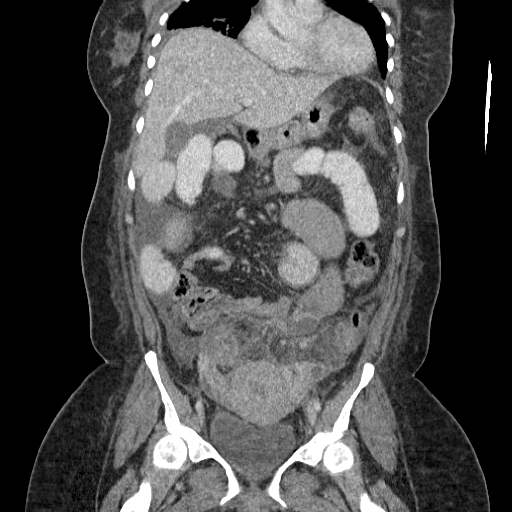
[im 67/121  soft-tissue]
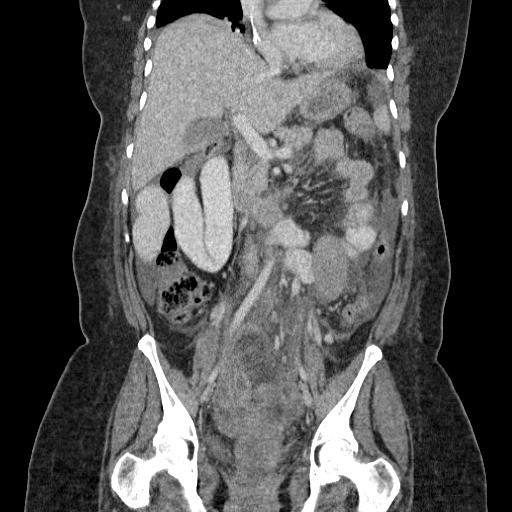

[17 of 46 positions shown; findings below may reference images not displayed]

FINDINGS: The lung bases demonstrate small bilateral pleural
 effusions and bibasilar atelectasis, right greater than left.

 The liver demonstrates no significant abnormalities. The spleen,
 pancreas, adrenal glands and kidneys are unremarkable and stable.
 The small bowel is dilated with air contrast levels and associated
 the small amount amount of free abdominal fluid. The gallbladder
 appears normal. The aorta is normal in caliber.
IMPRESSION: 1. Interval development of a small bowel obstruction.
 2. Progressive colitis now involving the descending colon.
 3. Bilateral pleural effusions and bibasilar atelectasis.

 CT PELVIS
FINDINGS: Significant worsening inflammatory process involving the
 rectum, entire sigmoid colon and ascending colon. Marked wall
 thickening and edema along with severe pericolonic inflammation and
 edema. This is now involving the small bowel loops and causing the
 small bowel obstruction. The uterus and ovaries are grossly
 normal. Small ovarian cyst are noted. Moderate free pelvic fluid.
IMPRESSION: Significant worsening inflammatory or infectious process involving
 the rectum and sigmoid colon. This is involving small bowel loops
 and causing a small bowel obstruction.

## 2011-06-02 IMAGING — CR DG ABDOMEN ACUTE W/ 1V CHEST
4 series · 4 of 4 positions shown · non-contrast
Comparison: 12/23/2008

CLINICAL DATA: Abdominal pain and nausea

ACUTE ABDOMEN SERIES (ABDOMEN 2 VIEW & CHEST 1 VIEW)

[w chest pa (1 of 2)]
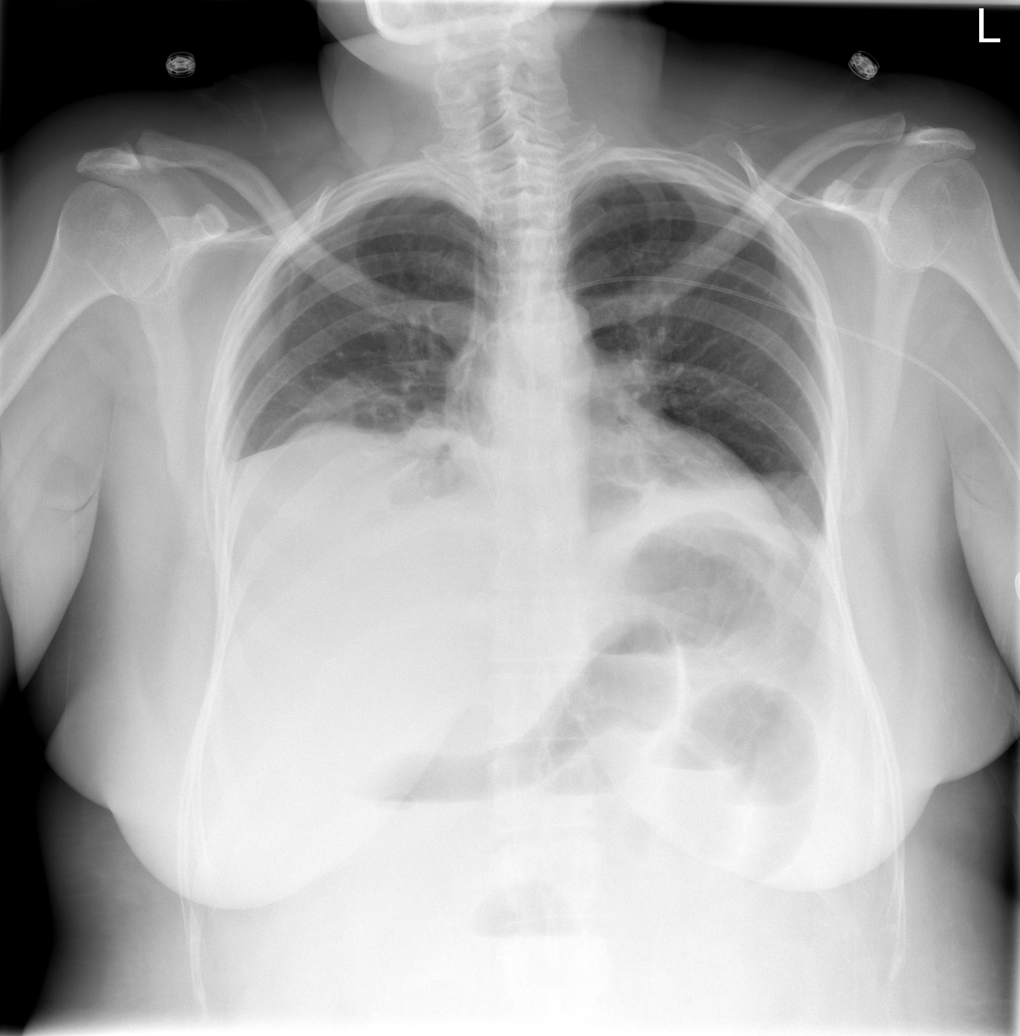

[w chest pa (2 of 2)]
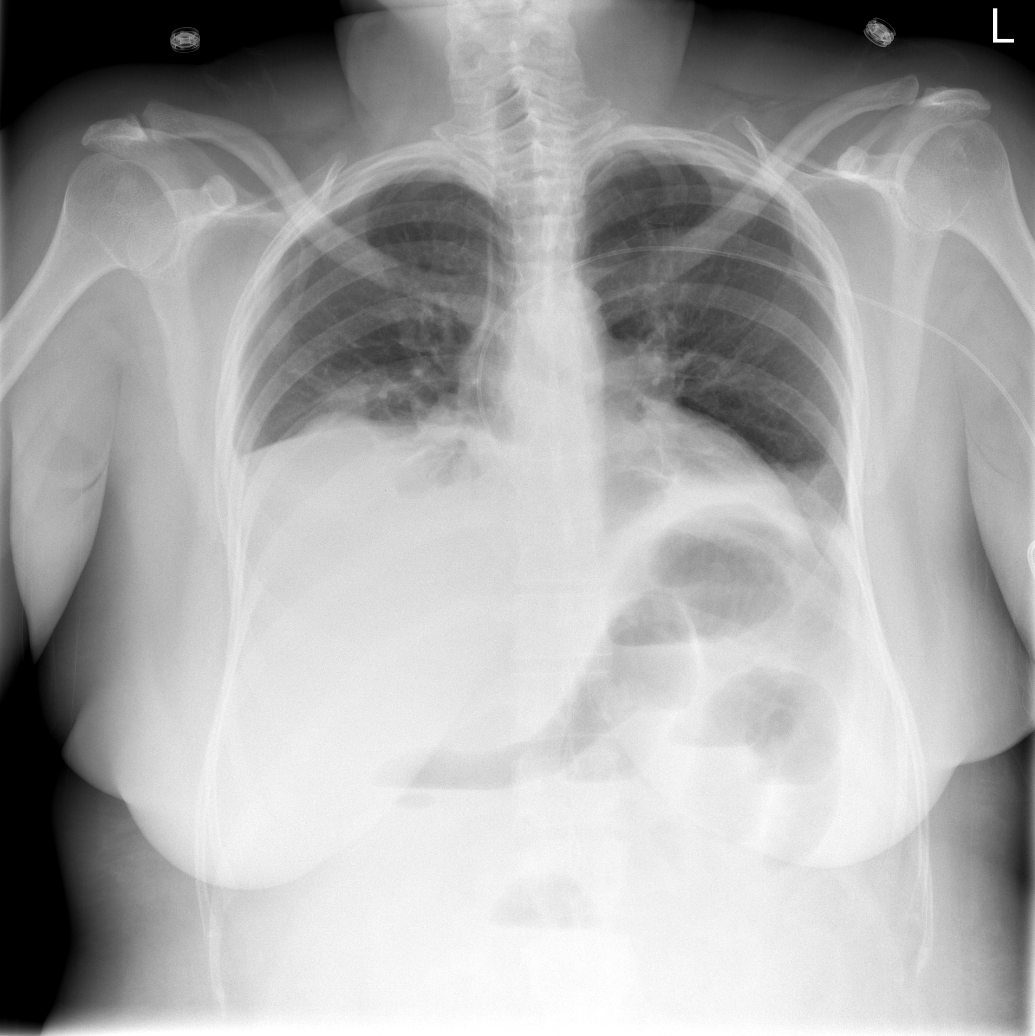

[w abdomen upright]
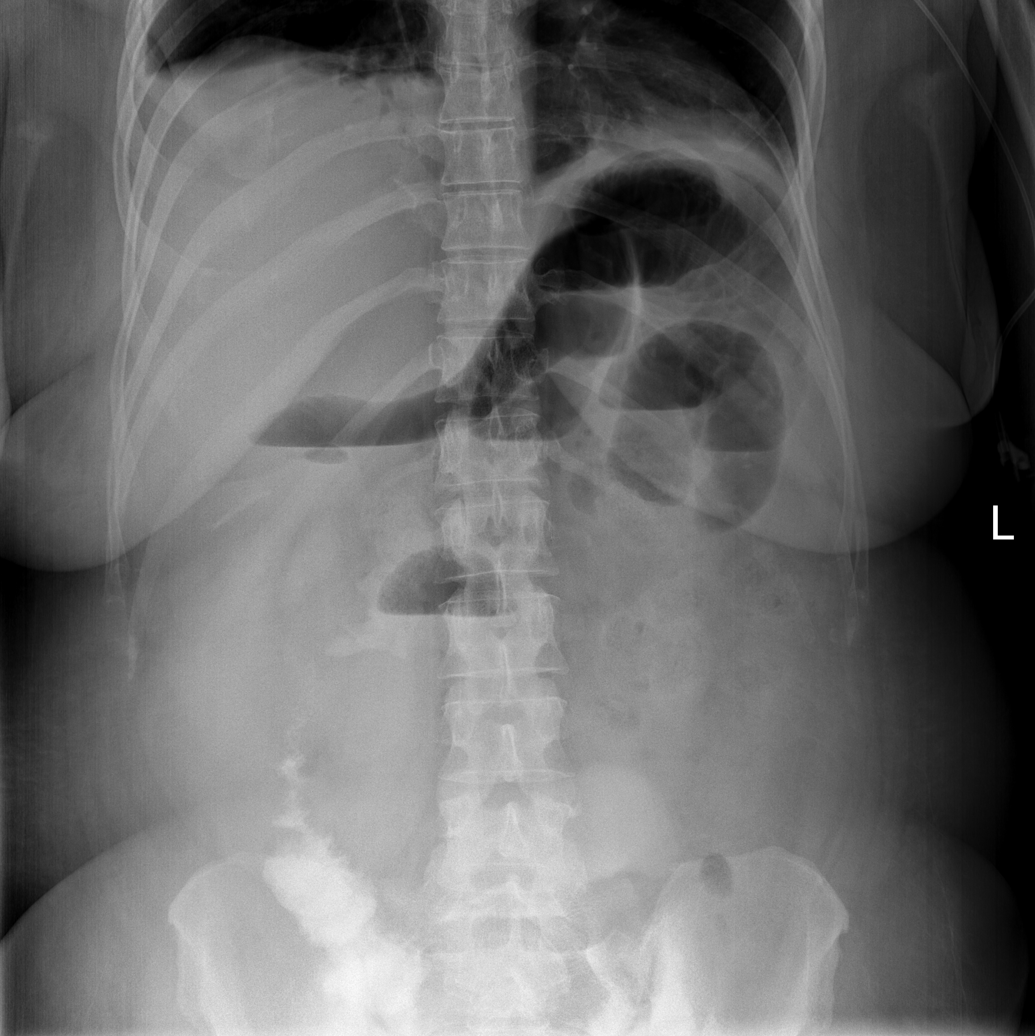

[t abdomen supine]
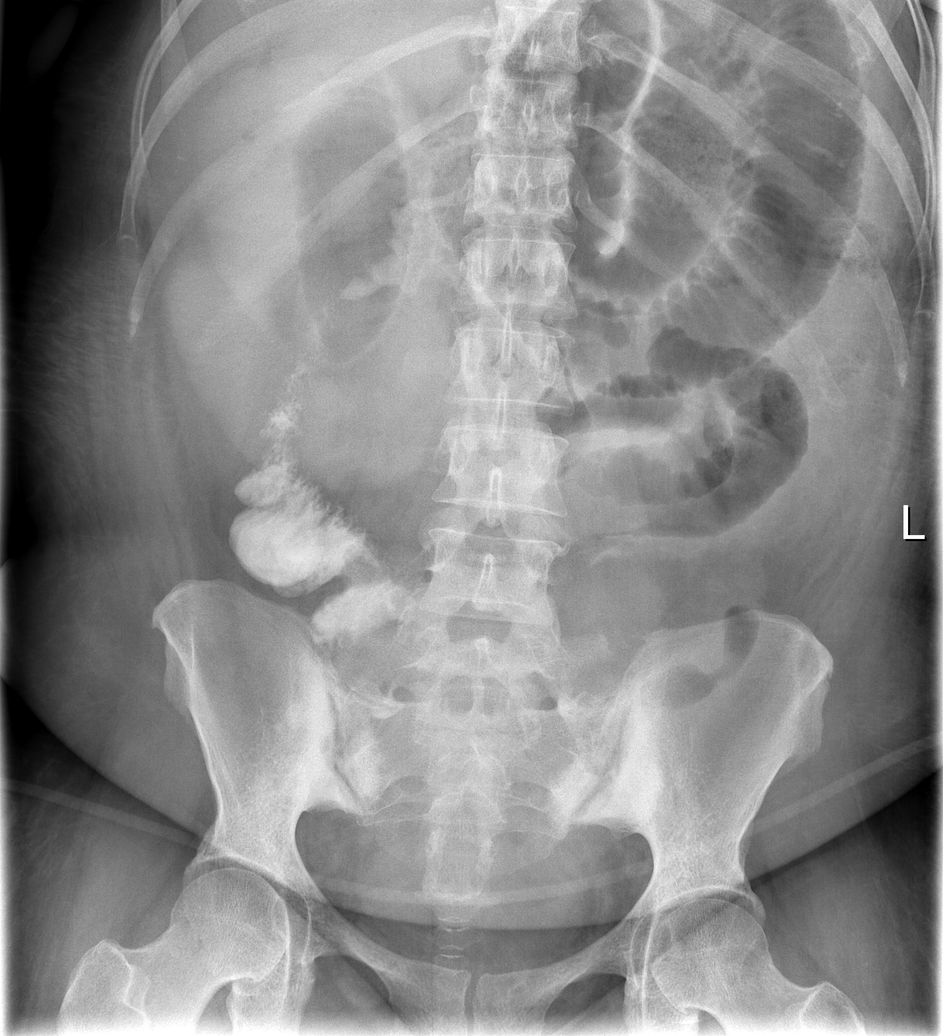

[4 of 4 positions shown; findings below may reference images not displayed]

FINDINGS: There is a left arm PICC line with tip in the SVC.

The lung volumes are low.

Atelectasis is noted within both lung bases.  There is no airspace
consolidation.

Abnormally dilated loops of small bowel with air-fluid levels are
again identified.  Enteric contrast material is now noted within
the proximal large bowel loops within the right lower quadrant.
IMPRESSION: 1.  Persistent small bowel obstruction pattern.

## 2011-08-09 ENCOUNTER — Emergency Department (HOSPITAL_COMMUNITY)
Admission: EM | Admit: 2011-08-09 | Discharge: 2011-08-09 | Disposition: A | Discharge status: Home / Self Care | Payer: 59 | Attending: Emergency Medicine | Admitting: Emergency Medicine

## 2011-08-09 ENCOUNTER — Other Ambulatory Visit: Payer: Self-pay

## 2011-08-09 ENCOUNTER — Encounter (HOSPITAL_COMMUNITY): Payer: Self-pay | Admitting: Family Medicine

## 2011-08-09 ENCOUNTER — Emergency Department (HOSPITAL_COMMUNITY): Payer: 59

## 2011-08-09 DIAGNOSIS — M549 Dorsalgia, unspecified: Secondary | ICD-10-CM | POA: Insufficient documentation

## 2011-08-09 DIAGNOSIS — R079 Chest pain, unspecified: Secondary | ICD-10-CM | POA: Insufficient documentation

## 2011-08-09 DIAGNOSIS — R0602 Shortness of breath: Secondary | ICD-10-CM | POA: Insufficient documentation

## 2011-08-09 DIAGNOSIS — I10 Essential (primary) hypertension: Secondary | ICD-10-CM | POA: Insufficient documentation

## 2011-08-09 DIAGNOSIS — R42 Dizziness and giddiness: Secondary | ICD-10-CM | POA: Insufficient documentation

## 2011-08-09 LAB — POCT I-STAT TROPONIN I: Troponin i, poc: 0.01 ng/mL (ref 0.00–0.08)

## 2011-08-09 LAB — CBC
Platelets: 248 10*3/uL (ref 150–400)
RDW: 12.8 % (ref 11.5–15.5)
WBC: 4.6 10*3/uL (ref 4.0–10.5)

## 2011-08-09 LAB — COMPREHENSIVE METABOLIC PANEL
AST: 16 U/L (ref 0–37)
Albumin: 3.7 g/dL (ref 3.5–5.2)
Alkaline Phosphatase: 102 U/L (ref 39–117)
Chloride: 104 mEq/L (ref 96–112)
Potassium: 3.7 mEq/L (ref 3.5–5.1)
Total Bilirubin: 0.2 mg/dL — ABNORMAL LOW (ref 0.3–1.2)

## 2011-08-09 LAB — PROTIME-INR: INR: 0.95 (ref 0.00–1.49)

## 2011-08-09 LAB — D-DIMER, QUANTITATIVE: D-Dimer, Quant: 0.3 ug/mL-FEU (ref 0.00–0.48)

## 2011-08-09 MED ORDER — OXYCODONE-ACETAMINOPHEN 5-325 MG PO TABS: 1.0000 | ORAL_TABLET | Freq: Four times a day (QID) | ORAL | Status: AC | PRN

## 2011-08-09 MED ORDER — TRAMADOL HCL 50 MG PO TABS: 50.0000 mg | ORAL_TABLET | Freq: Four times a day (QID) | ORAL | Status: AC | PRN

## 2011-08-09 MED ORDER — MORPHINE SULFATE 4 MG/ML IJ SOLN
4.0000 mg | Freq: Once | INTRAMUSCULAR | Status: AC
  Administered 2011-08-09: 4 mg via INTRAVENOUS
  Filled 2011-08-09: qty 1

## 2011-08-09 MED ORDER — SODIUM CHLORIDE 0.9 % IV SOLN
20.0000 mL | INTRAVENOUS | Status: DC
  Administered 2011-08-09: 500 mL via INTRAVENOUS

## 2011-08-09 MED ORDER — NAPROXEN 500 MG PO TABS: 500.0000 mg | ORAL_TABLET | Freq: Two times a day (BID) | ORAL | Status: DC

## 2011-08-09 NOTE — ED Notes (Signed)
Pt reports severe back pain and central chest pain starting last night with sob and dizziness.

## 2011-08-09 NOTE — Discharge Instructions (Signed)
Chest Pain (Nonspecific) It is often hard to give a specific diagnosis for the cause of chest pain. There is always a chance that your pain could be related to something serious, such as a heart attack or a blood clot in the lungs. You need to follow up with your caregiver for further evaluation. CAUSES   Heartburn.   Pneumonia or bronchitis.   Anxiety or stress.   Inflammation around your heart (pericarditis) or lung (pleuritis or pleurisy).   A blood clot in the lung.   A collapsed lung (pneumothorax). It can develop suddenly on its own (spontaneous pneumothorax) or from injury (trauma) to the chest.   Shingles infection (herpes zoster virus).  The chest wall is composed of bones, muscles, and cartilage. Any of these can be the source of the pain.  The bones can be bruised by injury.   The muscles or cartilage can be strained by coughing or overwork.   The cartilage can be affected by inflammation and become sore (costochondritis).  DIAGNOSIS  Lab tests or other studies, such as X-rays, electrocardiography, stress testing, or cardiac imaging, may be needed to find the cause of your pain.  TREATMENT   Treatment depends on what may be causing your chest pain. Treatment may include:   Acid blockers for heartburn.   Anti-inflammatory medicine.   Pain medicine for inflammatory conditions.   Antibiotics if an infection is present.   You may be advised to change lifestyle habits. This includes stopping smoking and avoiding alcohol, caffeine, and chocolate.   You may be advised to keep your head raised (elevated) when sleeping. This reduces the chance of acid going backward from your stomach into your esophagus.   Most of the time, nonspecific chest pain will improve within 2 to 3 days with rest and mild pain medicine.  HOME CARE INSTRUCTIONS   If antibiotics were prescribed, take your antibiotics as directed. Finish them even if you start to feel better.   For the next few  days, avoid physical activities that bring on chest pain. Continue physical activities as directed.   Do not smoke.   Avoid drinking alcohol.   Only take over-the-counter or prescription medicine for pain, discomfort, or fever as directed by your caregiver.   Follow your caregiver's suggestions for further testing if your chest pain does not go away.   Keep any follow-up appointments you made. If you do not go to an appointment, you could develop lasting (chronic) problems with pain. If there is any problem keeping an appointment, you must call to reschedule.  SEEK MEDICAL CARE IF:   You think you are having problems from the medicine you are taking. Read your medicine instructions carefully.   Your chest pain does not go away, even after treatment.   You develop a rash with blisters on your chest.  SEEK IMMEDIATE MEDICAL CARE IF:   You have increased chest pain or pain that spreads to your arm, neck, jaw, back, or abdomen.   You develop shortness of breath, an increasing cough, or you are coughing up blood.   You have severe back or abdominal pain, feel nauseous, or vomit.   You develop severe weakness, fainting, or chills.   You have a fever.  THIS IS AN EMERGENCY. Do not wait to see if the pain will go away. Get medical help at once. Call your local emergency services (911 in [place] Do not drive yourself to the hospital. MAKE SURE YOU:   Understand these instructions.     Will watch your condition.   Will get help right away if you are not doing well or get worse.  Document Released: 01/30/2005 Document Revised: 04/11/2011 Document Reviewed: 11/26/2007 [employer] Patient Information 2012 [employer]. 

## 2011-08-09 NOTE — ED Notes (Signed)
Pt c/o of upper gastric pain that radiates to back intermittently x2 days with sob on exertion and pains worsens with movement.  Pt denies diaphoreses or nausea and vomiting.

## 2011-08-09 NOTE — ED Provider Notes (Signed)
History     CSN: [id number]  Arrival date & time 08/09/11  9449   First MD Initiated Contact with Patient 08/09/11 [phone]      Chief Complaint  Patient presents with  . Chest Pain  . Back Pain  . Dizziness    HPI Comments: Pt was at work on Wednesday.  It was a little sore but not that bad.  It has been getting worse.  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  The pain started in her back in the thoracic region and moves to the front.  Patient is a 51 y.o. female presenting with chest pain and back pain. The history is provided by the patient.  Chest Pain The chest pain began yesterday. Chest pain occurs constantly. The chest pain is worsening. The pain is currently at 9/10. The severity of the pain is moderate. The quality of the pain is described as pleuritic and sharp. Chest pain is worsened by deep breathing. Primary symptoms include shortness of breath and dizziness. Pertinent negatives for primary symptoms include no fever, no cough, no palpitations and no nausea.  Dizziness does not occur with nausea.   Pertinent negatives for past medical history include no MI and no PE.    Back Pain  Associated symptoms include chest pain. Pertinent negatives include no fever.    Past Medical History  Diagnosis Date  . Hypertension   . Ulcerative colitis     History reviewed. No pertinent past surgical history.  History reviewed. No pertinent family history.  History  Substance Use Topics  . Smoking status: Never Smoker   . Smokeless tobacco: Not on file  . Alcohol Use: No    OB History    Grav Para Term Preterm Abortions TAB SAB Ect Mult Living                  Review of Systems  Constitutional: Negative for fever.  Respiratory: Positive for shortness of breath. Negative for cough.   Cardiovascular: Positive for chest pain. Negative for palpitations.  Gastrointestinal: Negative for nausea.  Musculoskeletal: Positive for back pain.  Neurological:  Positive for dizziness.    Allergies  Metoclopramide hcl  Home Medications   Current Outpatient Rx  Name Route Sig Dispense Refill  . MESALAMINE 400 MG PO TBEC Oral Take 400 mg by mouth 3 (three) times daily.      . TRAMADOL HCL 50 MG PO TABS Oral Take 50 mg by mouth as directed. Take up to two times a day as needed for pain       BP 111/66  Pulse 81  Temp(Src) 98.4 F (36.9 C) (Oral)  Resp 17  SpO2 99%  LMP 08/06/2011  Physical Exam  Nursing note and vitals reviewed. Constitutional: She appears well-developed and well-nourished. No distress.  HENT:  Head: Normocephalic and atraumatic.  Right Ear: External ear normal.  Left Ear: External ear normal.  Eyes: Conjunctivae are normal. Right eye exhibits no discharge. Left eye exhibits no discharge. No scleral icterus.  Neck: Neck supple. No tracheal deviation present.  Cardiovascular: Normal rate, regular rhythm and intact distal pulses.   Pulmonary/Chest: Effort normal and breath sounds normal. No stridor. No respiratory distress. She has no wheezes. She has no rales. She exhibits tenderness.  Abdominal: Soft. Bowel sounds are normal. She exhibits no distension. There is no tenderness. There is no rebound and no guarding.  Musculoskeletal: She exhibits tenderness. She exhibits no edema.  Thoracic back: She exhibits tenderness. She exhibits no bony tenderness, no swelling and no edema.       ttp eft calf  Neurological: She is alert. She has normal strength. No sensory deficit. Cranial nerve deficit:  no gross defecits noted. She exhibits normal muscle tone. She displays no seizure activity. Coordination normal.  Skin: Skin is warm and dry. No rash noted.  Psychiatric: She has a normal mood and affect.    ED Course  Procedures (including critical care time)  Rate: 81  Rhythm: normal sinus rhythm  QRS Axis: normal  Intervals: normal  ST/T Wave abnormalities: normal  Conduction Disutrbances:none  Narrative  Interpretation:   Old EKG Reviewed: none available  Labs Reviewed  COMPREHENSIVE METABOLIC PANEL - Abnormal; Notable for the following:    Total Bilirubin 0.2 (*)    All other components within normal limits  CBC  PROTIME-INR  APTT  D-DIMER, QUANTITATIVE  POCT I-STAT TROPONIN I   Dg Chest Portable 1 View  08/09/2011  *RADIOLOGY REPORT*  Clinical Data: Chest and back pain  PORTABLE CHEST - 1 VIEW  Comparison: 12/28/2008; 12/25/2008  Findings: Grossly unchanged cardiac silhouette and mediastinal contours.  Interval removal of left upper extremity approach PICC line and enteric tube.  Improved inspiratory effort with mild eventration of the right hemidiaphragm.  Scattered bilateral linear heterogeneous opacities favored to represent atelectasis.  No pneumothorax or pleural effusion.  Grossly unchanged bones.  IMPRESSION: No acute cardiopulmonary disease.  Original Report Authenticated By: Rachel Moulds, M.D.     MDM  Initial symptoms suggest a musculoskeletal etiology. I have low suspicion for cardiac ischemia, or aortic dissection. The patient does have some calf tenderness but otherwise no symptoms to suggest a pulmonary and wasn't. A d-dimer will be sent for screening purposes   The patient has had a reassuring evaluation in the emergency department. I suspect that her symptoms are musculoskeletal in nature. She'll be discharged home with a prescription for medications for pain. Recommend followup with her primary doctor in the next week to be rechecked if the symptoms do not resolve and she's been given instructions about warning signs that should prompt return to emergency room.       Kathalene Frames, MD 08/09/11 1030

## 2011-08-11 ENCOUNTER — Emergency Department (HOSPITAL_COMMUNITY): Payer: 59

## 2011-08-11 ENCOUNTER — Encounter (HOSPITAL_COMMUNITY): Payer: Self-pay | Admitting: *Deleted

## 2011-08-11 ENCOUNTER — Emergency Department (HOSPITAL_COMMUNITY)
Admission: EM | Admit: 2011-08-11 | Discharge: 2011-08-12 | Disposition: A | Discharge status: Home / Self Care | Payer: 59 | Attending: Emergency Medicine | Admitting: Emergency Medicine

## 2011-08-11 DIAGNOSIS — R5383 Other fatigue: Secondary | ICD-10-CM | POA: Insufficient documentation

## 2011-08-11 DIAGNOSIS — R109 Unspecified abdominal pain: Secondary | ICD-10-CM | POA: Insufficient documentation

## 2011-08-11 DIAGNOSIS — R197 Diarrhea, unspecified: Secondary | ICD-10-CM | POA: Insufficient documentation

## 2011-08-11 DIAGNOSIS — R112 Nausea with vomiting, unspecified: Secondary | ICD-10-CM | POA: Insufficient documentation

## 2011-08-11 DIAGNOSIS — R5381 Other malaise: Secondary | ICD-10-CM | POA: Insufficient documentation

## 2011-08-11 DIAGNOSIS — K921 Melena: Secondary | ICD-10-CM | POA: Insufficient documentation

## 2011-08-11 DIAGNOSIS — I10 Essential (primary) hypertension: Secondary | ICD-10-CM | POA: Insufficient documentation

## 2011-08-11 LAB — COMPREHENSIVE METABOLIC PANEL
ALT: 16 U/L (ref 0–35)
AST: 13 U/L (ref 0–37)
Albumin: 4.2 g/dL (ref 3.5–5.2)
Alkaline Phosphatase: 116 U/L (ref 39–117)
Chloride: 102 mEq/L (ref 96–112)
Creatinine, Ser: 0.59 mg/dL (ref 0.50–1.10)
GFR calc Af Amer: >90 mL/min (ref >90)
GFR calc non Af Amer: >90 mL/min (ref >90)
Potassium: 4.1 mEq/L (ref 3.5–5.1)
Total Protein: 8.3 g/dL (ref 6.0–8.3)

## 2011-08-11 LAB — DIFFERENTIAL
Basophils Absolute: 0 10*3/uL (ref 0.0–0.1)
Basophils Relative: 0 % (ref 0–1)
Lymphocytes Relative: 23 % (ref 12–46)
Neutro Abs: 5.1 10*3/uL (ref 1.7–7.7)
Neutrophils Relative %: 71 % (ref 43–77)

## 2011-08-11 LAB — CBC
Hemoglobin: 14.1 g/dL (ref 12.0–15.0)
MCHC: 34.6 g/dL (ref 30.0–36.0)
RDW: 12.6 % (ref 11.5–15.5)
WBC: 7.2 10*3/uL (ref 4.0–10.5)

## 2011-08-11 LAB — LIPASE, BLOOD: Lipase: 19 U/L (ref 11–59)

## 2011-08-11 LAB — POCT PREGNANCY, URINE: Preg Test, Ur: NEGATIVE

## 2011-08-11 LAB — URINE MICROSCOPIC-ADD ON

## 2011-08-11 LAB — URINALYSIS, ROUTINE W REFLEX MICROSCOPIC
Protein, ur: 30 mg/dL — AB
Urobilinogen, UA: 1 mg/dL (ref 0.0–1.0)

## 2011-08-11 MED ORDER — ONDANSETRON HCL 4 MG/2ML IJ SOLN
4.0000 mg | Freq: Once | INTRAMUSCULAR | Status: AC
  Administered 2011-08-11: 4 mg via INTRAVENOUS
  Filled 2011-08-11: qty 2

## 2011-08-11 MED ORDER — HYDROMORPHONE HCL PF 1 MG/ML IJ SOLN
1.0000 mg | Freq: Once | INTRAMUSCULAR | Status: AC
  Administered 2011-08-11: 1 mg via INTRAVENOUS
  Filled 2011-08-11: qty 1

## 2011-08-11 MED ORDER — SODIUM CHLORIDE 0.9 % IV BOLUS (SEPSIS)
1000.0000 mL | Freq: Once | INTRAVENOUS | Status: AC
  Administered 2011-08-11: 1000 mL via INTRAVENOUS

## 2011-08-11 MED ORDER — IOHEXOL 300 MG/ML  SOLN
100.0000 mL | Freq: Once | INTRAMUSCULAR | Status: AC | PRN
  Administered 2011-08-11: 100 mL via INTRAVENOUS

## 2011-08-11 NOTE — ED Notes (Signed)
Pt ambulated to bathroom and vomited.  Nausea meds ordered.

## 2011-08-11 NOTE — ED Notes (Signed)
Pt from home with reports of lower abdominal pain that started on Friday, pt treated and released from [facility] ED for same but pt reports minimal improvement. Pt also reports hx of abscess on intestines in 2010 with hospitalization for 19 days. Pt also endorses nausea but denies emesis.

## 2011-08-11 NOTE — ED Provider Notes (Addendum)
History     CSN: [id number]  Arrival date & time 08/11/11  1718   First MD Initiated Contact with Patient 08/11/11 1940      Chief Complaint  Patient presents with  . Abdominal Pain  . Nausea  . Fatigue    (Consider location/radiation/quality/duration/timing/severity/associated sxs/prior treatment) HPI Complains of diffuse crampy abdominal pain nonradiating constant onset 3 days ago. Seen here 08/09/2011 for complaint of chest pain prescribed tramadol which she has taken without relief she reports. Reports one episode of diarrhea  and one episode of vomiting today. No chest pain no shortness of breath. Associated symptoms include generalized weakness and fatigue, feels dehydrated. Last bowel movement yesterday was watery with small streaks of blood which is typical for her. Nothing makes symptoms better or worse pain is moderate at present. She denies nausea at present. No fever. No other complaint Past Medical History  Diagnosis Date  . Hypertension   . Ulcerative colitis     Past Surgical History  Procedure Date  . Abdominal surgery     History reviewed. No pertinent family history.  History  Substance Use Topics  . Smoking status: Never Smoker   . Smokeless tobacco: Never Used  . Alcohol Use: No    OB History    Grav Para Term Preterm Abortions TAB SAB Ect Mult Living                  Review of Systems  Constitutional: Positive for fatigue.  HENT: Negative.   Respiratory: Negative.   Cardiovascular: Negative.   Gastrointestinal: Positive for nausea, vomiting, abdominal pain, diarrhea and blood in stool.  Musculoskeletal: Negative.   Skin: Negative.   Neurological: Negative.   Hematological: Negative.   Psychiatric/Behavioral: Negative.   All other systems reviewed and are negative.    Allergies  Metoclopramide hcl  Home Medications   Current Outpatient Rx  Name Route Sig Dispense Refill  . IBUPROFEN 200 MG PO TABS Oral Take 400 mg by mouth every 6  (six) hours as needed. pain    . MESALAMINE 400 MG PO TBEC Oral Take 400 mg by mouth 2 (two) times daily.     Marland Kitchen NAPROXEN 500 MG PO TABS Oral Take 1 tablet (500 mg total) by mouth 2 (two) times daily with a meal. As needed for pain 20 tablet 0  . TRAMADOL HCL 50 MG PO TABS Oral Take 1 tablet (50 mg total) by mouth every 6 (six) hours as needed for pain. 20 tablet 0  . OXYCODONE-ACETAMINOPHEN 5-325 MG PO TABS Oral Take 1 tablet by mouth every 6 (six) hours as needed for pain. 20 tablet 0    BP 120/66  Pulse 73  Temp(Src) 98.5 F (36.9 C) (Oral)  Resp 20  SpO2 100%  LMP 08/06/2011  Physical Exam  Nursing note and vitals reviewed. Constitutional: She appears well-developed and well-nourished. No distress.       Laughing with her family appears in no distress  HENT:  Head: Normocephalic and atraumatic.  Eyes: Conjunctivae are normal. Pupils are equal, round, and reactive to light.  Neck: Neck supple. No tracheal deviation present. No thyromegaly present.  Cardiovascular: Normal rate and regular rhythm.   No murmur heard. Pulmonary/Chest: Effort normal and breath sounds normal.  Abdominal: Soft. Bowel sounds are normal. She exhibits no distension. There is tenderness.       Mild diffuse tenderness  Genitourinary:       No flank tenderness  Musculoskeletal: Normal range of motion. She exhibits  no edema and no tenderness.  Neurological: She is alert. Coordination normal.  Skin: Skin is warm and dry. No rash noted.  Psychiatric: She has a normal mood and affect. Judgment normal.    ED Course  Procedures (including critical care time)   Labs Reviewed  COMPREHENSIVE METABOLIC PANEL  CBC  DIFFERENTIAL  LIPASE, BLOOD  URINALYSIS, ROUTINE W REFLEX MICROSCOPIC   No results found. Results for orders placed during the hospital encounter of 08/11/11  COMPREHENSIVE METABOLIC PANEL      Component Value Range   Sodium 138  135 - 145 (mEq/L)   Potassium 4.1  3.5 - 5.1 (mEq/L)    Chloride 102  96 - 112 (mEq/L)   CO2 28  19 - 32 (mEq/L)   Glucose, Bld 102 (*) 70 - 99 (mg/dL)   BUN 14  6 - 23 (mg/dL)   Creatinine, Ser 0.59  0.50 - 1.10 (mg/dL)   Calcium 9.9  8.4 - 10.5 (mg/dL)   Total Protein 8.3  6.0 - 8.3 (g/dL)   Albumin 4.2  3.5 - 5.2 (g/dL)   AST 13  0 - 37 (U/L)   ALT 16  0 - 35 (U/L)   Alkaline Phosphatase 116  39 - 117 (U/L)   Total Bilirubin 0.3  0.3 - 1.2 (mg/dL)   GFR calc non Af Amer >90  >90 (mL/min)   GFR calc Af Amer >90  >90 (mL/min)  CBC      Component Value Range   WBC 7.2  4.0 - 10.5 (K/uL)   RBC 4.29  3.87 - 5.11 (MIL/uL)   Hemoglobin 14.1  12.0 - 15.0 (g/dL)   HCT 40.8  36.0 - 46.0 (%)   MCV 95.1  78.0 - 100.0 (fL)   MCH 32.9  26.0 - 34.0 (pg)   MCHC 34.6  30.0 - 36.0 (g/dL)   RDW 12.6  11.5 - 15.5 (%)   Platelets 352  150 - 400 (K/uL)  DIFFERENTIAL      Component Value Range   Neutrophils Relative 71  43 - 77 (%)   Neutro Abs 5.1  1.7 - 7.7 (K/uL)   Lymphocytes Relative 23  12 - 46 (%)   Lymphs Abs 1.7  0.7 - 4.0 (K/uL)   Monocytes Relative 6  3 - 12 (%)   Monocytes Absolute 0.4  0.1 - 1.0 (K/uL)   Eosinophils Relative 0  0 - 5 (%)   Eosinophils Absolute 0.0  0.0 - 0.7 (K/uL)   Basophils Relative 0  0 - 1 (%)   Basophils Absolute 0.0  0.0 - 0.1 (K/uL)  LIPASE, BLOOD      Component Value Range   Lipase 19  11 - 59 (U/L)  URINALYSIS, ROUTINE W REFLEX MICROSCOPIC      Component Value Range   Color, Urine YELLOW  YELLOW    APPearance CLOUDY (*) CLEAR    Specific Gravity, Urine 1.028  1.005 - 1.030    pH 7.0  5.0 - 8.0    Glucose, UA NEGATIVE  NEGATIVE (mg/dL)   Hgb urine dipstick NEGATIVE  NEGATIVE    Bilirubin Urine NEGATIVE  NEGATIVE    Ketones, ur NEGATIVE  NEGATIVE (mg/dL)   Protein, ur 30 (*) NEGATIVE (mg/dL)   Urobilinogen, UA 1.0  0.0 - 1.0 (mg/dL)   Nitrite NEGATIVE  NEGATIVE    Leukocytes, UA SMALL (*) NEGATIVE   POCT PREGNANCY, URINE      Component Value Range   Preg Test, Ur NEGATIVE  NEGATIVE   URINE  MICROSCOPIC-ADD ON      Component Value Range   Squamous Epithelial / LPF FEW (*) RARE    WBC, UA 11-20  <3 (WBC/hpf)   RBC / HPF 0-2  <3 (RBC/hpf)   Bacteria, UA FEW (*) RARE    Urine-Other MUCOUS PRESENT     Ct Abdomen Pelvis W Contrast  08/11/2011  *RADIOLOGY REPORT*  Clinical Data: Diffuse abdominal and pelvic pain.  History of ulcerative colitis.  History of prior abscess with drain placement.  CT ABDOMEN AND PELVIS WITH CONTRAST  Technique:  Multidetector CT imaging of the abdomen and pelvis was performed following the standard protocol during bolus administration of intravenous contrast.  Contrast: 13m OMNIPAQUE IOHEXOL 300 MG/ML  SOLN  Comparison: 08/28/2010.  Findings: No extraluminal bowel inflammatory process  or free air. The rectosigmoid region is under distended with slightly thickened wall.  This is without significant change. Minimal amount of free fluid in the cul-de-sac may be related to recent ovulation rather than bowel inflammatory process.  Right ovarian cystic structure now measures 4.4 x 3.6 x 4 cm versus prior 4.6 x 3.4 x 3.6 cm.  This can be followed with pelvic sonogram.  No focal hepatic, splenic, pancreatic, adrenal or renal lesion.  No calcified gallstones.  No abdominal aortic aneurysm.  No pelvic or abdominal adenopathy. The  No bony destructive lesion.  Bilateral sacroiliac joint degenerative changes.  This may be related to patient's history of ulcerative colitis.  IMPRESSION: Slight thickening of the rectosigmoid region may be related to under distension or chronic inflammation however, no evidence of extraluminal bowel inflammatory process.  There is a tiny amount of free fluid in the cul-de-sac.  This may be related to ovulation rather than bowel inflammation.  Slight enlargement of right ovarian cystic structure.  This can be followed with pelvic sonogram.  Bilateral sacroiliac joint degenerative changes may be related to patient's history of ulcerative colitis.   Original Report Authenticated By: SDoug Sou M.D.   Dg Chest Portable 1 View  08/09/2011  *RADIOLOGY REPORT*  Clinical Data: Chest and back pain  PORTABLE CHEST - 1 VIEW  Comparison: 12/28/2008; 12/25/2008  Findings: Grossly unchanged cardiac silhouette and mediastinal contours.  Interval removal of left upper extremity approach PICC line and enteric tube.  Improved inspiratory effort with mild eventration of the right hemidiaphragm.  Scattered bilateral linear heterogeneous opacities favored to represent atelectasis.  No pneumothorax or pleural effusion.  Grossly unchanged bones.  IMPRESSION: No acute cardiopulmonary disease.  Original Report Authenticated By: JRachel Moulds M.D.     No diagnosis found.   12 midnight patient feels improved her to home MDM  Patient was prescribed Percocet at her last ED visit but only took one as it made her nauseated reports that tramadol makes her nauseated as well. I will prescribe Zofran for her to treat nausea She is advised to avoid naproxen and ibuprofen as they can cause GI upset Followup with Dr.Outlaw if pain continues in 2 or 3 days Clear liquid diet for 24 hours Patient has an appointment with her gynecologist this month. Pelvic ultrasound suggested as outpatient Diagnosis nonspecific abdominal pain         SOrlie Dakin MD 08/12/11 0014   Addendum 12:30 AM I was notified by the nurse the patient vomited upon getting up to leave for discharge. Zofran ODT as ordered by me. I offered to the patient and her husband to observe her further in the emergency  department. They elected to go home once receiving Zofran  Orlie Dakin, MD 08/12/11 0040

## 2011-08-11 NOTE — ED Notes (Signed)
Patient transported to CT 

## 2011-08-12 MED ORDER — ONDANSETRON HCL 4 MG PO TABS: 8.0000 mg | ORAL_TABLET | Freq: Three times a day (TID) | ORAL | Status: AC | PRN

## 2011-08-12 MED ORDER — ONDANSETRON 8 MG PO TBDP
ORAL_TABLET | ORAL | Status: AC
  Filled 2011-08-12: qty 1

## 2011-08-12 MED ORDER — ONDANSETRON HCL 4 MG PO TABS: 8.0000 mg | ORAL_TABLET | Freq: Four times a day (QID) | ORAL | Status: AC

## 2011-08-12 NOTE — Discharge Instructions (Signed)
Abdominal Pain Many things can cause belly (abdominal) pain. Most times, the belly pain is not dangerous. The amount of belly pain does not tell how serious the problem may be. Many cases of belly pain can be watched and treated at home. HOME CARE   Do not take medicines that help you go poop (laxatives) unless told to by your doctor.   Only take medicine as told by your doctor.   Eat or drink as told by your doctor. Your doctor will tell you if you should be on a special diet.  GET HELP RIGHT AWAY IF:   The pain does not go away.   You have a fever.   You keep throwing up (vomiting).   The pain changes and is only in the right or left part of the belly.   You have bloody or tarry looking poop.  MAKE SURE YOU:   Understand these instructions.   Will watch your condition.   Will get help right away if you are not doing well or get worse.  Document Released: 10/09/2007 Document Revised: 04/11/2011 Document Reviewed: 05/08/2009 [facility] Patient Information 2012 [place].   Stay on clear liquids for the next 24 hours. He can take the tramadol prescribed to you for mild pain or the oxycodone for bad pain. Take the medication prescribed for night as needed for nausea. Contact Dr.Outlaw if continuing to have a abdominal pain in the next 48 hours. Keep your scheduled appointment with your gynecologist. Ask your gynecologist to perform an ultrasound of her pelvic organs to check for ovarian cyst

## 2011-09-05 ENCOUNTER — Encounter (HOSPITAL_COMMUNITY): Payer: Self-pay | Admitting: *Deleted

## 2011-09-05 ENCOUNTER — Emergency Department (HOSPITAL_COMMUNITY)
Admission: EM | Admit: 2011-09-05 | Discharge: 2011-09-05 | Disposition: A | Discharge status: Home / Self Care | Payer: 59 | Attending: Emergency Medicine | Admitting: Emergency Medicine

## 2011-09-05 DIAGNOSIS — N12 Tubulo-interstitial nephritis, not specified as acute or chronic: Secondary | ICD-10-CM | POA: Insufficient documentation

## 2011-09-05 DIAGNOSIS — I10 Essential (primary) hypertension: Secondary | ICD-10-CM | POA: Insufficient documentation

## 2011-09-05 DIAGNOSIS — M549 Dorsalgia, unspecified: Secondary | ICD-10-CM | POA: Insufficient documentation

## 2011-09-05 DIAGNOSIS — K5190 Ulcerative colitis, unspecified, without complications: Secondary | ICD-10-CM | POA: Insufficient documentation

## 2011-09-05 DIAGNOSIS — R109 Unspecified abdominal pain: Secondary | ICD-10-CM | POA: Insufficient documentation

## 2011-09-05 LAB — URINE MICROSCOPIC-ADD ON

## 2011-09-05 LAB — BASIC METABOLIC PANEL
GFR calc non Af Amer: >90 mL/min (ref >90)
Glucose, Bld: 97 mg/dL (ref 70–99)
Potassium: 3.8 mEq/L (ref 3.5–5.1)
Sodium: 139 mEq/L (ref 135–145)

## 2011-09-05 LAB — URINALYSIS, ROUTINE W REFLEX MICROSCOPIC
Glucose, UA: NEGATIVE mg/dL
Specific Gravity, Urine: 1.012 (ref 1.005–1.030)
Urobilinogen, UA: 0.2 mg/dL (ref 0.0–1.0)

## 2011-09-05 MED ORDER — ONDANSETRON HCL 4 MG PO TABS: 4.0000 mg | ORAL_TABLET | Freq: Four times a day (QID) | ORAL | Status: AC

## 2011-09-05 MED ORDER — CIPROFLOXACIN HCL 500 MG PO TABS
500.0000 mg | ORAL_TABLET | Freq: Two times a day (BID) | ORAL | Status: AC
Start: 2011-09-05 — End: 2011-09-15

## 2011-09-05 NOTE — ED Provider Notes (Signed)
History     CSN: [id number]  Arrival date & time 09/05/11  [place]   First MD Initiated Contact with Patient 09/05/11 2009      Chief Complaint  Patient presents with  . Back Pain  . Abdominal Pain    (Consider location/radiation/quality/duration/timing/severity/associated sxs/prior treatment) HPI  Pt presents to the ED with complaints of dysuria for the past couple of days and today right flank pain. The patient was recently treated with antibiotics for a UTI by her PCP. She denies having nausea, vomiting or diarrhea. She describes the pain as throbbing and full. After taking Ultram she admits the pain is no longer severe and now is a 5/10. Pt appears non toxic and does not appear to be in significant discomfort.   Past Medical History  Diagnosis Date  . Hypertension   . Ulcerative colitis     Past Surgical History  Procedure Date  . Abdominal surgery     No family history on file.  History  Substance Use Topics  . Smoking status: Never Smoker   . Smokeless tobacco: Never Used  . Alcohol Use: No    OB History    Grav Para Term Preterm Abortions TAB SAB Ect Mult Living                  Review of Systems   HEENT: denies blurry vision or change in hearing PULMONARY: Denies difficulty breathing and SOB CARDIAC: denies chest pain or heart palpitations MUSCULOSKELETAL:  denies being unable to ambulate ABDOMEN AL: denies nausea, vomiting and diarrhea GU: denies loss of bowel or urinary control NEURO: denies numbness and tingling in extremities   Allergies  Metoclopramide hcl  Home Medications   Current Outpatient Rx  Name Route Sig Dispense Refill  . IBUPROFEN 200 MG PO TABS Oral Take 400 mg by mouth every 6 (six) hours as needed. pain    . TRAMADOL HCL 50 MG PO TABS Oral Take 50 mg by mouth every 6 (six) hours as needed. For pain    . CIPROFLOXACIN HCL 500 MG PO TABS Oral Take 1 tablet (500 mg total) by mouth 2 (two) times daily. 28 tablet 0  . ONDANSETRON  HCL 4 MG PO TABS Oral Take 1 tablet (4 mg total) by mouth every 6 (six) hours. 12 tablet 0    BP 123/77  Pulse 79  Temp(Src) 97.2 F (36.2 C) (Oral)  Resp 18  SpO2 99%  LMP 08/06/2011  Physical Exam  Nursing note and vitals reviewed. Constitutional: She appears well-developed and well-nourished. No distress.  HENT:  Head: Normocephalic and atraumatic.  Eyes: Pupils are equal, round, and reactive to light.  Neck: Normal range of motion. Neck supple.  Cardiovascular: Normal rate and regular rhythm.   Pulmonary/Chest: Effort normal.  Abdominal: Soft. She exhibits no distension. There is tenderness (suprapubic pain). There is CVA tenderness. There is no rebound and no guarding.  Neurological: She is alert.  Skin: Skin is warm and dry.    ED Course  Procedures (including critical care time)  Labs Reviewed  URINALYSIS, ROUTINE W REFLEX MICROSCOPIC - Abnormal; Notable for the following:    Color, Urine RED (*) BIOCHEMICALS MAY BE AFFECTED BY COLOR   APPearance TURBID (*)    Hgb urine dipstick LARGE (*)    Protein, ur 100 (*)    Leukocytes, UA LARGE (*)    All other components within normal limits  URINE MICROSCOPIC-ADD ON - Abnormal; Notable for the following:    Squamous  Epithelial / LPF FEW (*)    All other components within normal limits  BASIC METABOLIC PANEL  URINE CULTURE   No results found.   1. Pyelonephritis       MDM  Phlebotomists were unable to get IV access, the patient declined having her blood drawn. I informed her of how important blood is and that we need to look at her white count and kidney function to ensure that no abnormalities were present but she still said she would not like to have her blood draw. Patients urine shows significant UTI, will treat for pyelonephritis and patient can follow-up with her PCP, Dr. Nyoka Cowden.  Urine Culture sent out. Patient informed that she will be called and antibiotics will be changed if the abx I prescribed does not  cover the bacteria causing her infection.  Pt written for Cipro 572m, 1 tab PO BID for 14 days and Zofran. Pt declined pain meds in ED or prescription for the same as she "hates how they make her feel".  Pt has been advised of the symptoms that warrant their return to the ED. Patient has voiced understanding and has agreed to follow-up with the PCP or specialist.         TLinus Mako PMonte Sereno05/02/13 2059  TLinus Mako PFronton05/02/13 2100  TLinus Mako PClayton05/02/13 2101

## 2011-09-05 NOTE — ED Notes (Signed)
Pt began having abdominal pain and back pain for the past 5 hours.  Pt is having pain and burning with urination.  No n/v or diarrhea

## 2011-09-05 NOTE — Discharge Instructions (Signed)
Pyelonephritis, Adult Pyelonephritis is a kidney infection. In general, there are 2 main types of pyelonephritis:  Infections that come on quickly without any warning (acute pyelonephritis).   Infections that persist for a long period of time (chronic pyelonephritis).  CAUSES  Two main causes of pyelonephritis are:  Bacteria traveling from the bladder to the kidney. This is a problem especially in pregnant women. The urine in the bladder can become filled with bacteria from multiple causes, including:   Inflammation of the prostate gland (prostatitis).   Sexual intercourse in females.   Bladder infection (cystitis).   Bacteria traveling from the bloodstream to the tissue part of the kidney.  Problems that may increase your risk of getting a kidney infection include:  Diabetes.   Kidney stones or bladder stones.   Cancer.   Catheters placed in the bladder.   Other abnormalities of the kidney or ureter.  SYMPTOMS   Abdominal pain.   Pain in the side or flank area.   Fever.   Chills.   Upset stomach.   Blood in the urine (dark urine).   Frequent urination.   Strong or persistent urge to urinate.   Burning or stinging when urinating.  DIAGNOSIS  Your caregiver may diagnose your kidney infection based on your symptoms. A urine sample may also be taken. TREATMENT  In general, treatment depends on how severe the infection is.   If the infection is mild and caught early, your caregiver may treat you with oral antibiotics and send you home.   If the infection is more severe, the bacteria may have gotten into the bloodstream. This will require intravenous (IV) antibiotics and a hospital stay. Symptoms may include:   High fever.   Severe flank pain.   Shaking chills.   Even after a hospital stay, your caregiver may require you to be on oral antibiotics for a period of time.   Other treatments may be required depending upon the cause of the infection.  HOME CARE  INSTRUCTIONS   Take your antibiotics as directed. Finish them even if you start to feel better.   Make an appointment to have your urine checked to make sure the infection is gone.   Drink enough fluids to keep your urine clear or pale yellow.   Take medicines for the bladder if you have urgency and frequency of urination as directed by your caregiver.  SEEK IMMEDIATE MEDICAL CARE IF:   You have a fever.   You are unable to take your antibiotics or fluids.   You develop shaking chills.   You experience extreme weakness or fainting.   There is no improvement after 2 days of treatment.  MAKE SURE YOU:  Understand these instructions.   Will watch your condition.   Will get help right away if you are not doing well or get worse.  Document Released: 04/22/2005 Document Revised: 04/11/2011 Document Reviewed: 09/26/2010 [facility] Patient Information 2012 [place].

## 2011-09-05 NOTE — ED Provider Notes (Signed)
Medical screening examination/treatment/procedure(s) were performed by non-physician practitioner and as supervising physician I was immediately available for consultation/collaboration.   Blanchie Dessert, MD 09/05/11 2303

## 2012-01-26 ENCOUNTER — Ambulatory Visit (INDEPENDENT_AMBULATORY_CARE_PROVIDER_SITE_OTHER): Payer: 59 | Admitting: Family Medicine

## 2012-01-26 VITALS — BP 110/80 | HR 78 | Temp 98.4°F | Resp 18 | Ht 65.0 in | Wt 202.0 lb

## 2012-01-26 DIAGNOSIS — S81809A Unspecified open wound, unspecified lower leg, initial encounter: Secondary | ICD-10-CM

## 2012-01-26 DIAGNOSIS — L03119 Cellulitis of unspecified part of limb: Secondary | ICD-10-CM

## 2012-01-26 DIAGNOSIS — L02419 Cutaneous abscess of limb, unspecified: Secondary | ICD-10-CM

## 2012-01-26 DIAGNOSIS — S91009A Unspecified open wound, unspecified ankle, initial encounter: Secondary | ICD-10-CM

## 2012-01-26 MED ORDER — DOXYCYCLINE HYCLATE 100 MG PO TABS: 100.0000 mg | ORAL_TABLET | Freq: Two times a day (BID) | ORAL | Status: DC

## 2012-01-26 NOTE — Progress Notes (Signed)
[facility] [address] [place] [address] [phone]  Date:  01/26/2012   Name:  Crystal Benton   DOB:  09-20-1960   MRN:  [id number]  PCP:  Annabell Sabal, MD    Chief Complaint: Insect Bite   History of Present Illness:  [name] is a 51 y.o. very pleasant female patient who presents with the following:  She noted a sting on her right calf on Friday when she was at work- today is Sunday.  She was working in the drive thru at [employer] and felt something bite her on the leg. The area has become more sore and swollen.  She has not noted any fever or flu- like symptoms.  The leg throbs and has become red.  She has no numbness in the foot or toes.    Patient Active Problem List  Diagnosis  . UNSPECIFIED ANEMIA  . IBD  . BACK PAIN  . ABDOMINAL PAIN    Past Medical History  Diagnosis Date  . Hypertension   . Ulcerative colitis     Past Surgical History  Procedure Date  . Abdominal surgery     History  Substance Use Topics  . Smoking status: Never Smoker   . Smokeless tobacco: Never Used  . Alcohol Use: No    No family history on file.  Allergies  Allergen Reactions  . Metoclopramide Hcl Other (See Comments)    cramping    Medication list has been reviewed and updated.  Current Outpatient Prescriptions on File Prior to Visit  Medication Sig Dispense Refill  . traMADol (ULTRAM) 50 MG tablet Take 50 mg by mouth every 6 (six) hours as needed. For pain      . ibuprofen (ADVIL,MOTRIN) 200 MG tablet Take 400 mg by mouth every 6 (six) hours as needed. pain        Review of Systems:  As per HPI- otherwise negative.   Physical Examination: Filed Vitals:   01/26/12 1519  BP: 110/80  Pulse: 78  Temp: 98.4 F (36.9 C)  Resp: 18   Filed Vitals:   01/26/12 1519  Height: 5' 5"  (1.651 m)  Weight: 202 lb (91.627 kg)   Body mass index is 33.61 kg/(m^2). Ideal Body Weight: Weight in (lb) to have BMI = 25: 149.9   GEN: WDWN, NAD,  Non-toxic, A & O x 3, overweight HEENT: Atraumatic, Normocephalic. Neck supple. No masses, No LAD. Ears and Nose: No external deformity. CV: RRR, No M/G/R. No JVD. No thrill. No extra heart sounds. PULM: CTA B, no wheezes, crackles, rhonchi. No retractions. No resp. distress. No accessory muscle use EXTR: No c/c/e NEURO Normal gait.  PSYCH: Normally interactive. Conversant. Not depressed or anxious appearing.  Calm demeanor.  Right calf: there is a pustule and surrounding cellulitis over the mid- lateral calf.  Normal circulation to the foot, normal DP pulse.  No sign of compartment syndrome.   VC obtained: cleaned area over pustule with alcohol, pricked skin with an 11 blade and then collected a sample of pus.    Sent sample to lab for culture   Assessment and Plan: 1. Cellulitis, leg  Wound culture, doxycycline (VIBRA-TABS) 100 MG tablet  2. Wound, open, leg     Cellulitis of leg- treat with doxy.  Gave note for work tomorrow. Elevated leg and use hot compresses.  Let me know if not better in the next 2 days- Sooner if worse.     Lamar Blinks, MD

## 2012-01-30 LAB — WOUND CULTURE: Gram Stain: NONE SEEN

## 2012-02-20 ENCOUNTER — Encounter (HOSPITAL_COMMUNITY): Payer: Self-pay | Admitting: *Deleted

## 2012-02-20 ENCOUNTER — Emergency Department (HOSPITAL_COMMUNITY): Payer: 59

## 2012-02-20 ENCOUNTER — Inpatient Hospital Stay (HOSPITAL_COMMUNITY)
Admission: EM | Admit: 2012-02-20 | Discharge: 2012-02-22 | DRG: 387 | Disposition: A | Discharge status: Home / Self Care | Payer: 59 | Attending: Family Medicine | Admitting: Family Medicine

## 2012-02-20 DIAGNOSIS — E876 Hypokalemia: Secondary | ICD-10-CM | POA: Diagnosis present

## 2012-02-20 DIAGNOSIS — R1110 Vomiting, unspecified: Secondary | ICD-10-CM

## 2012-02-20 DIAGNOSIS — Z888 Allergy status to other drugs, medicaments and biological substances status: Secondary | ICD-10-CM

## 2012-02-20 DIAGNOSIS — I10 Essential (primary) hypertension: Secondary | ICD-10-CM | POA: Diagnosis present

## 2012-02-20 DIAGNOSIS — K5190 Ulcerative colitis, unspecified, without complications: Secondary | ICD-10-CM

## 2012-02-20 DIAGNOSIS — Z881 Allergy status to other antibiotic agents status: Secondary | ICD-10-CM

## 2012-02-20 DIAGNOSIS — Z79899 Other long term (current) drug therapy: Secondary | ICD-10-CM

## 2012-02-20 DIAGNOSIS — K5289 Other specified noninfective gastroenteritis and colitis: Secondary | ICD-10-CM | POA: Diagnosis present

## 2012-02-20 DIAGNOSIS — A09 Infectious gastroenteritis and colitis, unspecified: Secondary | ICD-10-CM

## 2012-02-20 DIAGNOSIS — K589 Irritable bowel syndrome without diarrhea: Secondary | ICD-10-CM | POA: Diagnosis present

## 2012-02-20 DIAGNOSIS — K529 Noninfective gastroenteritis and colitis, unspecified: Secondary | ICD-10-CM

## 2012-02-20 LAB — COMPREHENSIVE METABOLIC PANEL
ALT: 16 U/L (ref 0–35)
AST: 22 U/L (ref 0–37)
Albumin: 3.8 g/dL (ref 3.5–5.2)
Calcium: 9.6 mg/dL (ref 8.4–10.5)
GFR calc Af Amer: >90 mL/min (ref >90)
Sodium: 142 mEq/L (ref 135–145)
Total Protein: 7.6 g/dL (ref 6.0–8.3)

## 2012-02-20 LAB — CBC
HCT: 38.7 % (ref 36.0–46.0)
Hemoglobin: 13.4 g/dL (ref 12.0–15.0)
MCV: 94.6 fL (ref 78.0–100.0)
RBC: 4.09 MIL/uL (ref 3.87–5.11)
WBC: 4.4 10*3/uL (ref 4.0–10.5)

## 2012-02-20 LAB — URINALYSIS, ROUTINE W REFLEX MICROSCOPIC
Glucose, UA: NEGATIVE mg/dL
Specific Gravity, Urine: 1.012 (ref 1.005–1.030)
Urobilinogen, UA: 0.2 mg/dL (ref 0.0–1.0)
pH: 6 (ref 5.0–8.0)

## 2012-02-20 MED ORDER — IOHEXOL 300 MG/ML  SOLN
20.0000 mL | INTRAMUSCULAR | Status: AC
  Administered 2012-02-20: 20 mL via ORAL

## 2012-02-20 MED ORDER — ACETAMINOPHEN 650 MG RE SUPP: 650.0000 mg | Freq: Four times a day (QID) | RECTAL | Status: DC | PRN

## 2012-02-20 MED ORDER — MORPHINE SULFATE 4 MG/ML IJ SOLN
4.0000 mg | Freq: Once | INTRAMUSCULAR | Status: DC
  Filled 2012-02-20 (×5): qty 1

## 2012-02-20 MED ORDER — IBUPROFEN 400 MG PO TABS
400.0000 mg | ORAL_TABLET | Freq: Four times a day (QID) | ORAL | Status: DC | PRN
  Filled 2012-02-20: qty 1

## 2012-02-20 MED ORDER — HEPARIN SODIUM (PORCINE) 5000 UNIT/ML IJ SOLN
5000.0000 [IU] | Freq: Three times a day (TID) | INTRAMUSCULAR | Status: DC
  Administered 2012-02-20 – 2012-02-22 (×5): 5000 [IU] via SUBCUTANEOUS
  Filled 2012-02-20 (×8): qty 1

## 2012-02-20 MED ORDER — ACETAMINOPHEN 325 MG PO TABS: 650.0000 mg | ORAL_TABLET | Freq: Four times a day (QID) | ORAL | Status: DC | PRN

## 2012-02-20 MED ORDER — PROMETHAZINE HCL 25 MG/ML IJ SOLN
25.0000 mg | Freq: Once | INTRAMUSCULAR | Status: AC
  Administered 2012-02-20: 25 mg via INTRAMUSCULAR
  Filled 2012-02-20: qty 1

## 2012-02-20 MED ORDER — HYDROMORPHONE HCL PF 1 MG/ML IJ SOLN
1.0000 mg | INTRAMUSCULAR | Status: DC
  Filled 2012-02-20: qty 1

## 2012-02-20 MED ORDER — ONDANSETRON HCL 4 MG/2ML IJ SOLN
4.0000 mg | Freq: Four times a day (QID) | INTRAMUSCULAR | Status: DC | PRN
  Administered 2012-02-21 – 2012-02-22 (×3): 4 mg via INTRAVENOUS
  Filled 2012-02-20: qty 2

## 2012-02-20 MED ORDER — ONDANSETRON HCL 4 MG/2ML IJ SOLN
4.0000 mg | Freq: Once | INTRAMUSCULAR | Status: DC
  Filled 2012-02-20 (×3): qty 2

## 2012-02-20 MED ORDER — IOHEXOL 300 MG/ML  SOLN
80.0000 mL | Freq: Once | INTRAMUSCULAR | Status: AC | PRN
  Administered 2012-02-20: 80 mL via INTRAVENOUS

## 2012-02-20 MED ORDER — ONDANSETRON HCL 4 MG/2ML IJ SOLN
4.0000 mg | Freq: Once | INTRAMUSCULAR | Status: AC
  Administered 2012-02-20: 4 mg via INTRAVENOUS

## 2012-02-20 MED ORDER — CIPROFLOXACIN IN D5W 400 MG/200ML IV SOLN
400.0000 mg | Freq: Two times a day (BID) | INTRAVENOUS | Status: DC
  Administered 2012-02-20 – 2012-02-22 (×4): 400 mg via INTRAVENOUS
  Filled 2012-02-20 (×6): qty 200

## 2012-02-20 MED ORDER — MESALAMINE 400 MG PO CPDR
400.0000 mg | DELAYED_RELEASE_CAPSULE | Freq: Two times a day (BID) | ORAL | Status: DC
  Administered 2012-02-21 – 2012-02-22 (×4): 400 mg via ORAL
  Filled 2012-02-20 (×5): qty 1

## 2012-02-20 MED ORDER — HYDROMORPHONE HCL PF 1 MG/ML IJ SOLN
1.0000 mg | INTRAMUSCULAR | Status: AC
  Administered 2012-02-20: 1 mg via INTRAVENOUS
  Filled 2012-02-20: qty 1

## 2012-02-20 MED ORDER — SODIUM CHLORIDE 0.9 % IV SOLN
INTRAVENOUS | Status: DC
  Administered 2012-02-20: 20:00:00 via INTRAVENOUS
  Administered 2012-02-21: 20 mL/h via INTRAVENOUS
  Administered 2012-02-21: 06:00:00 via INTRAVENOUS

## 2012-02-20 MED ORDER — ONDANSETRON HCL 4 MG PO TABS: 4.0000 mg | ORAL_TABLET | Freq: Four times a day (QID) | ORAL | Status: DC | PRN

## 2012-02-20 MED ORDER — HYDROMORPHONE HCL PF 1 MG/ML IJ SOLN
1.0000 mg | INTRAMUSCULAR | Status: AC
  Administered 2012-02-20: 1 mg via INTRAMUSCULAR
  Filled 2012-02-20: qty 1

## 2012-02-20 MED ORDER — MORPHINE SULFATE 4 MG/ML IJ SOLN
4.0000 mg | INTRAMUSCULAR | Status: DC | PRN
  Administered 2012-02-20 – 2012-02-22 (×6): 4 mg via INTRAVENOUS
  Filled 2012-02-20 (×3): qty 1

## 2012-02-20 MED ORDER — ONDANSETRON HCL 4 MG/2ML IJ SOLN
4.0000 mg | Freq: Once | INTRAMUSCULAR | Status: AC
  Administered 2012-02-20: 4 mg via INTRAVENOUS
  Filled 2012-02-20 (×2): qty 2

## 2012-02-20 NOTE — ED Notes (Signed)
Patient is resting comfortably. Pt starting oral contrast

## 2012-02-20 NOTE — ED Notes (Signed)
Pt. Has c/o all over abdominal pain.  Pt. Started with bloody stools last night.  Pt. Has a PMH of intestinal abscess and was dx. With Ulcerative Colitis.  Pt. Has had a sick contact at work.  Pt. Has c/o ALL OVER ABDOMINAL TENDERNESS WITH palpation  And is tearful in the room.

## 2012-02-20 NOTE — ED Notes (Signed)
IV site red and slightly swollen at site.  IV d/c'd and IV team paged

## 2012-02-20 NOTE — H&P (Signed)
Family Medicine Teaching [facility] Admission History and Physical Service Pager: [phone]  Patient name: Crystal Benton Medical record number: [id number] Date of birth: February 22, 1961 Age: 51 y.o. Gender: female  Primary Care Provider: Renold Don, MD of [facility]  Chief Complaint: Diarrhea, Hematochezia, Vomiting, Nausea  Assessment and Plan: Crystal Benton is a 51 y.o. year old female presenting with gastroenteritis with possible UC flare 1. Gastroenteritis (Viral vs Bacterial) vs Ulcerative Colitis Flare 1. CT scan shows ileocecal valve inflammation.  Concerning for infxn vs inflammation.  Otherwise no acute inflammation.  Do not feel this is related to her GB, pancreas, or appendix.  Her LFT's were not elevated and was not having RUQ pain. CT did not show inflammation as well here. Other workup nonrevealing-no urinary symptoms, lipase wnl, no signs of acute abdomen.  2. Per Dr. Evette Cristal who is on call for [facility], start pt on cipro for infxn causes, restart Asacol for UC, IV fluids for decreased PO intake, and f/u with Dr. Dulce Sellar, her primary GI doctor, as an outpatient 3. Will also get stool studies to look for bacterial etiology (stool cultures and fecal lactoferrin) 4. If no improvement in the next day, will formally consult GI to take evaluate pt. 5. Pt does state she had trouble with affording the Asacol which is why she stopped taking it about 5 months ago.  Consider talking to GI again for alternative medication for pt to be sent home on.  6. Continue to monitor vitals as well due to possible bleed.  Will repeat CBC in AM but Hgb stable upon presentation. Will also get CBC if any blood in stool overnight.  7. Pain and nausea  control-tylenol and ibuprofen prn. Will also order morphine prn. Zofran for nausea prn.  2. FEN/GI: Advance Diet as tolerated.  3. Prophylaxis: Heparin SQ  4. Disposition: Pending clinical improvement, d/c home with close f/u with GI and PCP 5. Code  Status: Full   History of Present Illness: Crystal Benton is a 51 y.o. year old female presenting with BRBPR x 2 today along with increased abdominal pain.  Pt states that Monday she started to have increased abdominal pain beginning Monday along with some body chills but not rigors.  Tuesday, she started to have nausea with some vomiting, dark brown, but non-bloody which continued into the following day with increased abdominal pain localized to the LLQ and suprapubic region.  Today, she woke up and had the increased pain along with beginning to have loose bowel movements that were bloody x2. She decided to come to the ED at this point.  In the ED they performed a BMP (WNL), LFT's (WNL), a UA showing moderate LE with small squamous cells.  With her PMHx of UC, she had a CT scan showing some inflammation/infxn around the ileocecal valve but otherwise normal.  A lipase was normal as well.   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 No dysuria, no increased frequency.   In 2010, she was dx with Ulcerative Colitis and was seen by Dr. Dulce Sellar at [facility].  She had a severe acute flare at this time and was placed on IV solumedrol.  Upon d/c, she was started on 400 mg Asacol   Patient Active Problem List  Diagnosis  . UNSPECIFIED ANEMIA  . IBD  . BACK PAIN  . ABDOMINAL PAIN  Past Medical History: Past Medical History  Diagnosis Date  . Hypertension   . Ulcerative colitis    Past Surgical History: Past Surgical History  Procedure Date  . Abdominal surgery    Social History: History  Substance Use Topics  . Smoking status: Never Smoker   . Smokeless tobacco: Never Used  . Alcohol Use: No   For any additional social history documentation, please refer to relevant sections of EMR.  Family History: History reviewed. No pertinent family  history. Allergies: Allergies  Allergen Reactions  . Flagyl (Metronidazole) Other (See Comments)    Makes her feel like she's dying  . Metoclopramide Hcl Other (See Comments)    cramping   No current facility-administered medications on file prior to encounter.   Current Outpatient Prescriptions on File Prior to Encounter  Medication Sig Dispense Refill  . ibuprofen (ADVIL,MOTRIN) 200 MG tablet Take 400 mg by mouth every 6 (six) hours as needed. pain       Review Of Systems: Per HPI  Otherwise 12 point review of systems was performed and was unremarkable.  Physical Exam: BP 115/68  Pulse 65  Temp 98.1 F (36.7 C) (Oral)  Resp 16  SpO2 99%  LMP 02/20/2012 Exam: See upper level addendum   Labs and Imaging: CBC BMET   Lab 02/20/12 0848  WBC 4.4  HGB 13.4  HCT 38.7  PLT 264    Lab 02/20/12 0848  NA 142  K 4.2  CL 106  CO2 26  BUN 19  CREATININE 0.68  GLUCOSE 100*  CALCIUM 9.6     CT Abdomen 02/20/12: IMPRESSION:  Mild wall thickening in the region of the cecum/ileocecal valve,  possibly infectious/inflammatory. Given the history, colonoscopy  is suggested to exclude an underlying colonic mass.  No evidence of bowel obstruction. Normal appendix.  No CT findings to account for the patient's left abdominal pain.   Gildardo Cranker, DO 02/20/2012, 4:19 PM  Family Medicine Upper Level Addendum:   I have seen and examined the patient independently, discussed with Dr. Paulina Fusi, fully reviewed the H+P and agree with it's contents with the additions as noted in blue text. My independent exam is below.   BP 120/78  Pulse 59  Temp 98.1 F (36.7 C) (Oral)  Resp 16  SpO2 97%  LMP 02/20/2012 Gen: NAD, resting comfortably in bed HEENT: NCAT, MMM, PERRLA  CV: RRR no mrg  Lungs: CTAB  Abd: nondistended abdomen. Normal bowel sounds. No hepatosplenomegaly. Diffuse mild tenderness. Moderate tenderness with palpation of LLQ >RLQ + suprapubic area.  MSK: moves all  extremities, trace edema  Skin: warm and dry, no rash  Neuro: alert and oriented x3, grossly normal   Tana Conch, MD, PGY2 02/20/2012 5:36 PM

## 2012-02-20 NOTE — ED Provider Notes (Signed)
Medical screening examination/treatment/procedure(s) were conducted as a shared visit with non-physician practitioner(s) and myself.  I personally evaluated the patient during the encounter  Emilian Stawicki K Linker, MD 02/20/12 1630 

## 2012-02-20 NOTE — ED Notes (Signed)
Report called to [facility] RN

## 2012-02-20 NOTE — ED Provider Notes (Signed)
History     CSN: [id number]  Arrival date & time 02/20/12  1191   First MD Initiated Contact with Patient 02/20/12 [phone]      Chief Complaint  Patient presents with  . Abdominal Pain  . Rectal Bleeding    (Consider location/radiation/quality/duration/timing/severity/associated sxs/prior treatment) HPI Pt presents with c/o abdominal pain and vomiting with loose stools with some blood noted last night and continuing this morning.  No fever chills.  Emesis is nonbloody and nonbilious.  Pain is in left lower abdomen.  Pain is sharp and constant.  There are no other associated systemic symptoms, there are no other alleviating or modifying factors. She has a hx of ulcerative colitis and states she has not been taking her asacol as she ran out.    Past Medical History  Diagnosis Date  . Hypertension   . Ulcerative colitis     Past Surgical History  Procedure Date  . Abdominal surgery     History reviewed. No pertinent family history.  History  Substance Use Topics  . Smoking status: Never Smoker   . Smokeless tobacco: Never Used  . Alcohol Use: No    OB History    Grav Para Term Preterm Abortions TAB SAB Ect Mult Living                  Review of Systems ROS reviewed and all otherwise negative except for mentioned in HPI  Allergies  Metoclopramide hcl  Home Medications   Current Outpatient Rx  Name Route Sig Dispense Refill  . DOXYCYCLINE HYCLATE 100 MG PO TABS Oral Take 1 tablet (100 mg total) by mouth 2 (two) times daily. 20 tablet 0  . IBUPROFEN 200 MG PO TABS Oral Take 400 mg by mouth every 6 (six) hours as needed. pain    . OXYCODONE HCL 15 MG PO TABS Oral Take 15 mg by mouth every 4 (four) hours as needed.    Marland Kitchen TRAMADOL HCL 50 MG PO TABS Oral Take 50 mg by mouth every 6 (six) hours as needed. For pain      BP 144/75  Pulse 71  Temp 97.9 F (36.6 C) (Oral)  Resp 18  SpO2 100%  LMP 02/20/2012 Vitals reviewed Physical Exam Physical Examination:  General appearance - alert, well appearing, and in no distress Mental status - alert, oriented to person, place, and time Eyes - no conjunctival injection, no scleral icterus Mouth - mucous membranes moist, pharynx normal without lesions Chest - clear to auscultation, no wheezes, rales or rhonchi, symmetric air entry Heart - normal rate, regular rhythm, normal S1, S2, no murmurs, rubs, clicks or gallops Abdomen - soft, ttp diffusely worst in left lower abdomen, no gaurding or rebound tenderness, nondistended, no masses or organomegaly, nabs Extremities - peripheral pulses normal, no pedal edema, no clubbing or cyanosis Skin - normal coloration and turgor, no rashes  ED Course  Procedures (including critical care time)  9:26 AM d/w Trixie Dredge, PA in CDU- pt to be moved there while continuing her workup including labs, meds, hydration, and abdominal CT scan.     Labs Reviewed  CBC  COMPREHENSIVE METABOLIC PANEL  LIPASE, BLOOD  URINALYSIS, ROUTINE W REFLEX MICROSCOPIC   No results found.   No diagnosis found.    MDM  Pt presenting with c/o abdominal pain, vomiting and diarrhea, has seen streaks of blood in stool.  No melena.  Has hx of ulcerative colitis not on meds.  Plan for labs, CT scan.  Pt given IV hydration, antiemetics and IV pain control.  Pt moved to CDU to complete her workup and dispo.          Ethelda Chick, MD 02/20/12 1050

## 2012-02-20 NOTE — ED Notes (Signed)
Patient is resting comfortably. 

## 2012-02-20 NOTE — ED Provider Notes (Signed)
9:26 AM Patient to move to CDU holding.  Sign out received from Dr Karma Ganja.  Patient with hx ulcerative colitis, p/w abdominal pain, diarrhea, ?blood in her stool.  Patient has not been taking her Asacol.  Pain in LLQ, progressively worsening since yesterday.  Plan is for labs, IVF, symptomatic medications, CT abd/pelvis.  If CT, labs without concerning findings and patient feeling better, pt may be discharged home.    10:10 AM Patient reports pain is 11/10 intensity.  IV infiltrated, patient unable to receive medications previously.  IV team called.  I have ordered IM medications.  Pt actively drinking contrast for scan.  Abdomen is soft, nondistended, TTP diffusely, worst in LLQ, no guarding, no rebound.   2:12 PM Discussed results with Dr Karma Ganja.  Will consult GI.  Also discussed results with patient.    2:23 PM I spoke with Dr Evette Cristal who will check records with the office and call me back.  Patient was last seen by Dr Dulce Sellar 7 months ago and stopped her asacol 5 months ago.    Filed Vitals:   02/20/12 1415  BP: 116/46  Pulse: 67  Temp: 98.1 F (36.7 C)  Resp: 16   2:33 PM Dr Evette Cristal recommends putting patient back on asacol 800mg  TID, flagyl 500mg  TID x 7 days.  No steroids.  Recommends stool culture.  Dr Dulce Sellar to see as outpatient vs will consult if patient admitted.    3:46 PM Patient not tolerating PO, even ice chips.  I have called [facility] (patient's PCP) who will admit the patient.   Pt agrees with plan for admission.     Results for orders placed during the hospital encounter of 02/20/12  CBC      Component Value Range   WBC 4.4  4.0 - 10.5 K/uL   RBC 4.09  3.87 - 5.11 MIL/uL   Hemoglobin 13.4  12.0 - 15.0 g/dL   HCT 16.1  09.6 - 04.5 %   MCV 94.6  78.0 - 100.0 fL   MCH 32.8  26.0 - 34.0 pg   MCHC 34.6  30.0 - 36.0 g/dL   RDW 40.9  81.1 - 91.4 %   Platelets 264  150 - 400 K/uL  COMPREHENSIVE METABOLIC PANEL      Component Value Range   Sodium 142  135 - 145 mEq/L     Potassium 4.2  3.5 - 5.1 mEq/L   Chloride 106  96 - 112 mEq/L   CO2 26  19 - 32 mEq/L   Glucose, Bld 100 (*) 70 - 99 mg/dL   BUN 19  6 - 23 mg/dL   Creatinine, Ser 7.82  0.50 - 1.10 mg/dL   Calcium 9.6  8.4 - 95.6 mg/dL   Total Protein 7.6  6.0 - 8.3 g/dL   Albumin 3.8  3.5 - 5.2 g/dL   AST 22  0 - 37 U/L   ALT 16  0 - 35 U/L   Alkaline Phosphatase 99  39 - 117 U/L   Total Bilirubin 0.2 (*) 0.3 - 1.2 mg/dL   GFR calc non Af Amer >90  >90 mL/min   GFR calc Af Amer >90  >90 mL/min  LIPASE, BLOOD      Component Value Range   Lipase 29  11 - 59 U/L  URINALYSIS, ROUTINE W REFLEX MICROSCOPIC      Component Value Range   Color, Urine YELLOW  YELLOW   APPearance CLOUDY (*) CLEAR   Specific Gravity,  Urine 1.012  1.005 - 1.030   pH 6.0  5.0 - 8.0   Glucose, UA NEGATIVE  NEGATIVE mg/dL   Hgb urine dipstick MODERATE (*) NEGATIVE   Bilirubin Urine NEGATIVE  NEGATIVE   Ketones, ur NEGATIVE  NEGATIVE mg/dL   Protein, ur NEGATIVE  NEGATIVE mg/dL   Urobilinogen, UA 0.2  0.0 - 1.0 mg/dL   Nitrite NEGATIVE  NEGATIVE   Leukocytes, UA MODERATE (*) NEGATIVE  URINE MICROSCOPIC-ADD ON      Component Value Range   Squamous Epithelial / LPF FEW (*) RARE   WBC, UA 7-10  <3 WBC/hpf   RBC / HPF 7-10  <3 RBC/hpf   Bacteria, UA FEW (*) RARE   Ct Abdomen Pelvis W Contrast  02/20/2012  *RADIOLOGY REPORT*  Clinical Data: Left abdominal pain, bloody stools, history of ulcerative colitis  CT ABDOMEN AND PELVIS WITH CONTRAST  Technique:  Multidetector CT imaging of the abdomen and pelvis was performed following the standard protocol during bolus administration of intravenous contrast.  Contrast: 80mL OMNIPAQUE IOHEXOL 300 MG/ML  SOLN  Comparison: 08/11/2011  Findings: Dependent atelectasis the lung bases.  Liver, spleen, pancreas, and adrenal glands are within normal limits.  Gallbladder is unremarkable.  No intrahepatic or extrahepatic ductal dilatation.  Kidneys are within normal limits.  No hydronephrosis.   No evidence of bowel obstruction.  Normal appendix.  Mild wall thickening in the region of the cecum/ileocecal valve (series 2/image 49), possibly infectious/inflammatory.  Otherwise, no colonic wall thickening or inflammatory changes.  No evidence of abdominal aortic aneurysm.  No abdominopelvic ascites.  No suspicious abdominopelvic lymphadenopathy.  Uterus and left ovary are unremarkable.  Two right ovarian/adnexal cysts measuring 3.4 x 3.5 cm (series 2/image 63) and 2.7 x 3.0 cm (series 2/image 66), likely physiologic.  Stable 1.5 x 1.0 cm fluid density lesion in the left lower pelvis (series 2/image 73), of uncertain etiology but dubious clinical significance.  Bladder is within normal limits.  Very mild degenerative changes of the visualized thoracolumbar spine.  Stable sclerosis involving the bilateral sacroiliac joints.  IMPRESSION: Mild wall thickening in the region of the cecum/ileocecal valve, possibly infectious/inflammatory.  Given the history, colonoscopy is suggested to exclude an underlying colonic mass.  No evidence of bowel obstruction.  Normal appendix.  No CT findings to account for the patient's left abdominal pain.   Original Report Authenticated By: Charline Bills, M.D.       [place], [place] 02/20/12 1547

## 2012-02-20 NOTE — H&P (Signed)
FMTS Attending Admit Note Patient seen and examined by me in conjunction with resident team, I discussed patient's care with residents and agree with assess/plan with following additions: Briefly, a 51 yr old F with history Ulcerative colitis diagnosed in 2010, who presents with acute onset (over 3 days) generalized fatigue and then nausea/vomiting yesterday and today; bloody bowel movement x2 this morning.  Has felt chills but no objective fever noted.  Notes, studies and labs done in ED reviewed at the time of patient exam.  She reports that she has menses every 3 or 4 months; began her menses today (has spaced out since 2010).   Of note on my exam, patient is alert and relatively comfortable-appearing. No apparent distress HEENT Mildly dry mucus membranes.  Neck supple. COR Regular S1S2 PULM Clear bilat ABD Soft; there is notable RLQ and LLQ tenderness to my exam.  Audible bowel sounds. No guarding noted, no masses.  No RUQ noted.   Assess/Plan: Patient with acute-onset nausea/vomiting/bloody bowel movements since this morning. Inability to tolerate by mouth.  Will rehydrate with IVF; follow H/H.  Monitor for worsening of pain, for fevers, leukocytosis.  The team has spoken with the gastroenterology service with whom she is established.  She states that Flagyl makes her violently ill and thus it is listed as an allergy in her allergy profile.  Is managed on Cipro for now.  (If additional anaerobic coverage were needed, to consider Zosyn or Imipenem.) Paula Compton, MD

## 2012-02-20 NOTE — ED Notes (Signed)
Pt given ice chips and experienced nausea following ice chips, she does not want any other fluids at this time.

## 2012-02-20 NOTE — ED Notes (Signed)
IV Team called 

## 2012-02-20 NOTE — ED Notes (Signed)
Patient transported to CT 

## 2012-02-20 NOTE — ED Notes (Signed)
Lab at bedside

## 2012-02-21 LAB — COMPREHENSIVE METABOLIC PANEL
BUN: 11 mg/dL (ref 6–23)
CO2: 26 mEq/L (ref 19–32)
Chloride: 106 mEq/L (ref 96–112)
Creatinine, Ser: 0.73 mg/dL (ref 0.50–1.10)
GFR calc non Af Amer: >90 mL/min (ref >90)
Total Bilirubin: 0.3 mg/dL (ref 0.3–1.2)

## 2012-02-21 LAB — CBC WITH DIFFERENTIAL/PLATELET
Eosinophils Absolute: 0.1 10*3/uL (ref 0.0–0.7)
Eosinophils Relative: 3 % (ref 0–5)
HCT: 37.7 % (ref 36.0–46.0)
Lymphs Abs: 1.4 10*3/uL (ref 0.7–4.0)
MCH: 32.3 pg (ref 26.0–34.0)
MCV: 94.5 fL (ref 78.0–100.0)
Monocytes Absolute: 0.3 10*3/uL (ref 0.1–1.0)
Platelets: 255 10*3/uL (ref 150–400)
RBC: 3.99 MIL/uL (ref 3.87–5.11)
RDW: 12.8 % (ref 11.5–15.5)

## 2012-02-21 LAB — MAGNESIUM: Magnesium: 1.8 mg/dL (ref 1.5–2.5)

## 2012-02-21 MED ORDER — POTASSIUM CHLORIDE CRYS ER 20 MEQ PO TBCR
40.0000 meq | EXTENDED_RELEASE_TABLET | Freq: Once | ORAL | Status: AC
  Administered 2012-02-21: 40 meq via ORAL
  Filled 2012-02-21 (×2): qty 2

## 2012-02-21 NOTE — Progress Notes (Signed)
FMTS Attending Note Patient seen and examined by me, discussed with resident team and I agree with assessment/plan. Patient is somewhat improved; no further bloody bowel movement (has not moved her bowels since admission).  Abdominal pain is less severe.   Agree with plan to obtain stool for culture; continue antibiotics for now.  Work on oral intake and continue to monitor lytes and CBC.  Repletion of potassium as we are doing.  Paula Compton, MD

## 2012-02-21 NOTE — Consult Note (Signed)
Subjective:   HPI  The patient is a 51 year old female with history of colitis. I saw a report of a flexible sigmoidoscopy from 2010 which showed inflammation in the rectum in the sigmoid. She saw Dr. Dulce Sellar a year and a half ago for the last time and at that time she was well maintained on Lialda for the colitis. She had previously been on Asacol but could not afford it and then she could not afford the Lialda so she quit taking all her medications. She came into the hospital yesterday with abdominal discomfort and a couple episodes of rectal bleeding. She feels fine now. A CT scan showed some thickening in the cecum and ileocecal valve area. I do not see where she has had a colonoscopy. Principal problem in regards to her colitis on treatment is that she cannot afford her medications. We're asked to see her to see if we have an option for a different medication that would be less expensive.  She is feeling better now sitting up in bed and eating a pizza  Review of Systems No chest pain or shortness of breath  Past Medical History  Diagnosis Date  . Hypertension   . Ulcerative colitis    Past Surgical History  Procedure Date  . Abdominal surgery    History   Social History  . Marital Status: Married    Spouse Name: N/A    Number of Children: N/A  . Years of Education: N/A   Occupational History  . Not on file.   Social History Main Topics  . Smoking status: Never Smoker   . Smokeless tobacco: Never Used  . Alcohol Use: No  . Drug Use: No  . Sexually Active: Yes   Other Topics Concern  . Not on file   Social History Narrative  . No narrative on file   family history is not on file. Current facility-administered medications:0.9 %  sodium chloride infusion, , Intravenous, Continuous, Jayce G Cook, DO;  acetaminophen (TYLENOL) suppository 650 mg, 650 mg, Rectal, Q6H PRN, Bryan R Hess, DO;  acetaminophen (TYLENOL) tablet 650 mg, 650 mg, Oral, Q6H PRN, Bryan R Hess, DO;   ciprofloxacin (CIPRO) IVPB 400 mg, 400 mg, Intravenous, Q12H, Bryan R Hess, DO, 400 mg at 02/21/12 0556 heparin injection 5,000 Units, 5,000 Units, Subcutaneous, Q8H, Bryan R Hess, DO, 5,000 Units at 02/21/12 1328;  ibuprofen (ADVIL,MOTRIN) tablet 400 mg, 400 mg, Oral, Q6H PRN, Bryan R Hess, DO;  Mesalamine (ASACOL) DR capsule 400 mg, 400 mg, Oral, BID, Bryan R Hess, DO, 400 mg at 02/21/12 0959;  morphine 4 MG/ML injection 4 mg, 4 mg, Intravenous, Once, Ethelda Chick, MD morphine 4 MG/ML injection 4 mg, 4 mg, Intravenous, Q4H PRN, Shelva Majestic, MD, 4 mg at 02/21/12 0646;  ondansetron [facility]) injection 4 mg, 4 mg, Intravenous, Once, Emily West, PA;  ondansetron [facility]) injection 4 mg, 4 mg, Intravenous, Q6H PRN, Twana First Hess, DO, 4 mg at 02/21/12 0828;  ondansetron (ZOFRAN) injection 4 mg, 4 mg, Intravenous, Once, [employer], PA-C, 4 mg at 02/20/12 1939 ondansetron (ZOFRAN) tablet 4 mg, 4 mg, Oral, Q6H PRN, Bryan R Hess, DO;  potassium chloride SA (K-DUR,KLOR-CON) CR tablet 40 mEq, 40 mEq, Oral, Once, Bryan R Hess, DO, 40 mEq at 02/21/12 1236;  DISCONTD: HYDROmorphone (DILAUDID) injection 1 mg, 1 mg, Intramuscular, NOW, Emilia Beck, PA-C Allergies  Allergen Reactions  . Flagyl (Metronidazole) Other (See Comments)    Makes her feel like she's dying  . Metoclopramide  Hcl Other (See Comments)    cramping     Objective:     BP 161/52  Pulse 80  Temp 98.1 F (36.7 C) (Oral)  Resp 18  Ht 5' 4.96" (1.65 m)  Wt 91.6 kg (201 lb 15.1 oz)  BMI 33.65 kg/m2  SpO2 99%  LMP 02/20/2012  She is in no distress  Heart regular rhythm  Lungs clear  Abdomen: Normal bowel sounds, soft, minimal tenderness right lower quadrant  Laboratory No components found with this basename: d1      Assessment:     History of ulcerative colitis  Abnormal CT with inflammation of the cecum and ileocecal valve area. This could be infectious in nature however could be some findings of colitis.  Need to also rule out a mass in this area.        Plan:     The patient seems clinically stable at this time. Since she cannot afford the above-mentioned medications for her colitis a less expensive option would be Azulfidine 1000 mg by mouth 4 times a day. I would recommend that she take this. She did not report an allergy to sulfa. I would give her a followup with Dr. Dulce Sellar in the office upon discharge. She should have a colonoscopy which I do not see that she has had done. If she is stable she can be discharged tomorrow.

## 2012-02-21 NOTE — Progress Notes (Signed)
PGY-1 Daily Progress Note Family Medicine Teaching Service Crystal R. Crystal Essman, DO Service Pager: [phone]   Subjective: Pt is doing better today than when she came in yesterday.  She is able to tolerate orals.  Is not having diarrhea or has not had a BM since presentation yesterday.   Objective:  VITALS Temp:  [97.8 F (36.6 C)-98.1 F (36.7 C)] 98 F (36.7 C) (10/18 0605) Pulse Rate:  [55-73] 72  (10/18 0605) Resp:  [16-22] 16  (10/18 0605) BP: (94-123)/(36-79) 122/62 mmHg (10/18 0605) SpO2:  [94 %-100 %] 99 % (10/18 0605) Weight:  [201 lb 15.1 oz (91.6 kg)] 201 lb 15.1 oz (91.6 kg) (10/17 2100)  In/Out  Intake/Output Summary (Last 24 hours) at 02/21/12 0858 Last data filed at 02/21/12 0556  Gross per 24 hour  Intake 1043.33 ml  Output    700 ml  Net 343.33 ml    Physical Exam: Gen: NAD, resting comfortably in bed  HEENT: NCAT, MMM, PERRLA  CV: RRR no mrg  Lungs: CTAB  Abd: nondistended abdomen. Normal bowel sounds. No hepatosplenomegaly. Diffuse mild tenderness. Moderate tenderness with palpation of LLQ >RLQ + suprapubic area.  MSK: moves all extremities, trace edema  Skin: warm and dry, no rash  Neuro: alert and oriented x3, grossly normal   MEDS Scheduled Meds:    . ciprofloxacin  400 mg Intravenous Q12H  . heparin  5,000 Units Subcutaneous Q8H  .  HYDROmorphone (DILAUDID) injection  1 mg Intramuscular NOW  .  HYDROmorphone (DILAUDID) injection  1 mg Intravenous NOW  . iohexol  20 mL Oral Q1 Hr x 2  . Mesalamine  400 mg Oral BID  .  morphine injection  4 mg Intravenous Once  . ondansetron  4 mg Intravenous Once  . ondansetron (ZOFRAN) IV  4 mg Intravenous Once  . ondansetron  4 mg Intravenous Once  . promethazine  25 mg Intramuscular Once  . DISCONTD:  HYDROmorphone (DILAUDID) injection  1 mg Intramuscular NOW   Continuous Infusions:   . sodium chloride 100 mL/hr at 02/21/12 0556   PRN Meds:.acetaminophen, acetaminophen, ibuprofen, iohexol, morphine  injection, ondansetron (ZOFRAN) IV, ondansetron  Labs and imaging:   CBC  Lab 02/21/12 0722 02/20/12 0848  WBC 3.1* 4.4  HGB 12.9 13.4  HCT 37.7 38.7  PLT 255 264   BMET/CMET  Lab 02/21/12 0722 02/20/12 0848  NA 140 142  K 3.3* 4.2  CL 106 106  CO2 26 26  BUN 11 19  CREATININE 0.73 0.68  CALCIUM 8.6 9.6  PROT 6.6 7.6  BILITOT 0.3 0.2*  ALKPHOS 98 99  ALT 18 16  AST 18 22  GLUCOSE 122* 100*   Results for orders placed during the hospital encounter of 02/20/12 (from the past 24 hour(s))  URINALYSIS, ROUTINE W REFLEX MICROSCOPIC     Status: Abnormal   Collection Time   02/20/12 10:42 AM      Component Value Range   Color, Urine YELLOW  YELLOW   APPearance CLOUDY (*) CLEAR   Specific Gravity, Urine 1.012  1.005 - 1.030   pH 6.0  5.0 - 8.0   Glucose, UA NEGATIVE  NEGATIVE mg/dL   Hgb urine dipstick MODERATE (*) NEGATIVE   Bilirubin Urine NEGATIVE  NEGATIVE   Ketones, ur NEGATIVE  NEGATIVE mg/dL   Protein, ur NEGATIVE  NEGATIVE mg/dL   Urobilinogen, UA 0.2  0.0 - 1.0 mg/dL   Nitrite NEGATIVE  NEGATIVE   Leukocytes, UA MODERATE (*) NEGATIVE  URINE  MICROSCOPIC-ADD ON     Status: Abnormal   Collection Time   02/20/12 10:42 AM      Component Value Range   Squamous Epithelial / LPF FEW (*) RARE   WBC, UA 7-10  <3 WBC/hpf   RBC / HPF 7-10  <3 RBC/hpf   Bacteria, UA FEW (*) RARE  COMPREHENSIVE METABOLIC PANEL     Status: Abnormal   Collection Time   02/21/12  7:22 AM      Component Value Range   Sodium 140  135 - 145 mEq/L   Potassium 3.3 (*) 3.5 - 5.1 mEq/L   Chloride 106  96 - 112 mEq/L   CO2 26  19 - 32 mEq/L   Glucose, Bld 122 (*) 70 - 99 mg/dL   BUN 11  6 - 23 mg/dL   Creatinine, Ser 8.11  0.50 - 1.10 mg/dL   Calcium 8.6  8.4 - 91.4 mg/dL   Total Protein 6.6  6.0 - 8.3 g/dL   Albumin 3.2 (*) 3.5 - 5.2 g/dL   AST 18  0 - 37 U/L   ALT 18  0 - 35 U/L   Alkaline Phosphatase 98  39 - 117 U/L   Total Bilirubin 0.3  0.3 - 1.2 mg/dL   GFR calc non Af Amer >90   >90 mL/min   GFR calc Af Amer >90  >90 mL/min  CBC WITH DIFFERENTIAL     Status: Abnormal   Collection Time   02/21/12  7:22 AM      Component Value Range   WBC 3.1 (*) 4.0 - 10.5 K/uL   RBC 3.99  3.87 - 5.11 MIL/uL   Hemoglobin 12.9  12.0 - 15.0 g/dL   HCT 78.2  95.6 - 21.3 %   MCV 94.5  78.0 - 100.0 fL   MCH 32.3  26.0 - 34.0 pg   MCHC 34.2  30.0 - 36.0 g/dL   RDW 08.6  57.8 - 46.9 %   Platelets 255  150 - 400 K/uL   Neutrophils Relative 41 (*) 43 - 77 %   Neutro Abs 1.3 (*) 1.7 - 7.7 K/uL   Lymphocytes Relative 46  12 - 46 %   Lymphs Abs 1.4  0.7 - 4.0 K/uL   Monocytes Relative 9  3 - 12 %   Monocytes Absolute 0.3  0.1 - 1.0 K/uL   Eosinophils Relative 3  0 - 5 %   Eosinophils Absolute 0.1  0.0 - 0.7 K/uL   Basophils Relative 0  0 - 1 %   Basophils Absolute 0.0  0.0 - 0.1 K/uL   Ct Abdomen Pelvis W Contrast  02/20/2012    IMPRESSION: Mild wall thickening in the region of the cecum/ileocecal valve, possibly infectious/inflammatory.  Given the history, colonoscopy is suggested to exclude an underlying colonic mass.  No evidence of bowel obstruction.  Normal appendix.  No CT findings to account for the patient's left abdominal pain.   Original Report Authenticated By: Charline Bills, M.D.    Assessment  Pt is a 51 y.o. patient with significant PMHx for UC admitted with nausea, vomiting, and two episodes of bloody stool.    Plan:  Crystal Benton is a 51 y.o. year old female presenting with gastroenteritis with possible UC flare  1. Gastroenteritis (Viral vs Bacterial) vs Ulcerative Colitis Flare  1. CT scan shows ileocecal valve inflammation. Concerning for infxn vs inflammation. Otherwise no acute inflammation. Do not feel this is related to her GB, pancreas,  or appendix. Her LFT's were not elevated and was not having RUQ pain. CT did not show inflammation as well here. Other workup nonrevealing-no urinary symptoms, lipase wnl, no signs of acute abdomen.  2. Restarted on Asacol,  on Cipro for possible bacterial etiology, and following stool Cx.  Will need to follow up with Dr. Dulce Sellar in outpt setting.  3. Pt does state she had trouble with affording the Asacol which is why she stopped taking it about 5 months ago. Consider talking to GI again for alternative medication for pt to be sent home on.  4. CBC shows stable Hgb, no concern for GI bleed at this time.  Continue to monitor.  5. Pain and nausea control-tylenol and ibuprofen prn. Will also order morphine prn. Zofran for nausea prn.  6. Did talk to GI today, Dr. Evette Cristal, who will come to evaluate her for acute cause and also medications to send her home on for UC since Asacol may be too expensive.  2. Hypokalemia - Pt K+ dropped to 3.2 this AM 1. Will get Mg level now. 2. Replete 40 meq x 1 PO 3. Recheck BMP tomorrow AM  3. FEN/GI: Advance Diet as tolerated.  4. Prophylaxis: Heparin SQ  5. Disposition: Pending clinical improvement, d/c home with close f/u with GI and PCP 6. Code Status: Full   Chief [place] R. Paulina Fusi, DO of [facility] 02/21/2012, 8:55 AM

## 2012-02-22 LAB — CBC
HCT: 38.4 % (ref 36.0–46.0)
MCH: 32.3 pg (ref 26.0–34.0)
MCHC: 34.1 g/dL (ref 30.0–36.0)
MCV: 94.8 fL (ref 78.0–100.0)
Platelets: 298 10*3/uL (ref 150–400)
RDW: 12.6 % (ref 11.5–15.5)
WBC: 4 10*3/uL (ref 4.0–10.5)

## 2012-02-22 LAB — BASIC METABOLIC PANEL
BUN: 11 mg/dL (ref 6–23)
CO2: 26 mEq/L (ref 19–32)
Calcium: 9.1 mg/dL (ref 8.4–10.5)
Creatinine, Ser: 0.7 mg/dL (ref 0.50–1.10)

## 2012-02-22 LAB — FECAL LACTOFERRIN, QUANT: Fecal Lactoferrin: POSITIVE

## 2012-02-22 MED ORDER — CIPROFLOXACIN HCL 500 MG PO TABS: 500.0000 mg | ORAL_TABLET | Freq: Two times a day (BID) | ORAL | Status: DC

## 2012-02-22 MED ORDER — SULFASALAZINE 500 MG PO TABS: 1000.0000 mg | ORAL_TABLET | Freq: Four times a day (QID) | ORAL | Status: DC

## 2012-02-22 MED ORDER — ONDANSETRON HCL 4 MG PO TABS: 4.0000 mg | ORAL_TABLET | Freq: Four times a day (QID) | ORAL | Status: DC | PRN

## 2012-02-22 NOTE — Progress Notes (Signed)
FMTS Attending Note Patient seen and examined by me this morning. She reports eating full breakfast (grits and [place] toast strips) and then felt a little nausea.  Abdominal pain in RLQ is markedly improved.  Had a bowel movement nonbloody, pellet-like yesterday.  No emesis.  No fevers.   By my exam this morning she is well appearing, sitting in chair, no distress HEENT neck supple.  ABD Soft, nontender. Audible bowel sounds noted throughout.   Labs for today reviewed; Normal WBC count and Hgb, and lytes.  Assess/Plan Patient admitted for bloody bowel movement and crampy abd pain which raised concern for UC flare, off her chronic meds.  She has made marked improvement.  She tells me she is skittish about returning home for fear of having another bloody bowel movement.  We discussed that if she continues to do well after lunch, it would be appropriate to discharge her on outpatient meds as outlined by Dr Evette Cristal in yesterday's note, with instructions for GI followup with Dr Dulce Sellar and plans for colonoscopy as an outpatient. She agrees with this plan for re-evaluation after lunch. Paula Compton, MD

## 2012-02-22 NOTE — Discharge Summary (Signed)
Physician Discharge Summary  Patient ID: Crystal Benton MRN: [id number] DOB: July 03, 1960 Age: 51 y.o.  Admit date: 02/20/2012 Discharge date: 02/22/2012 Admitting Physician: Barbaraann Barthel, MD  PCP: Renold Don, MD  Consultants:GI    Discharge Diagnosis: Principal Problem:  *Gastroenteritis Active Problems:  IBD    Hospital Course Crystal Benton is a 51 y.o. year old female presenting with gastroenteritis with possible UC flare  1. Gastroenteritis (Viral vs Bacterial) vs Ulcerative Colitis Flare - Pt came in with multiple days of vomiting and one day of bloody diarrhea.  Upon presentation to the ED, there was a concern for her ulcerative colitis flaring.  She had not been to see her GI doctor in 5 months and stopped taking her Asacol due to price.  Upon presentation to the ED, stool cultures were ordered, CBC (WNL), BMP (WNL), LFT's (normal), lipase - normal, and a UA showing moderate LE with squamous cells.  She was given ice chips and had trouble keeping these down.  A CT scan was done of her abdomen and showed inflammation around the ileocecal valve from possible infection vs inflammation.  She was transferred to the floor and started on diet as tolerated, started on cipro   She did not have diarrhea or vomiting due her hospitalization. GI came to evaluate her and felt this was most likely related to a gastroenteritis.  They also recommended putting her on Azulfidine 1000 mg qid for her UC along with f/u in the outpt setting with Dr. Dulce Sellar.  Pt was stable before d/c and was still nauseated, but did not have diarrhea or vomiting.  She was sent home on a limited supply of Zofran PRN.  2. Hypokalemia - Pt had a BMET showing K+ of 3.2.  She had a Mg checked which was normal and given KDur 40 meq x 1.  Her repeat BMET showed a K+ of 4.2        Discharge PE   Filed Vitals:   02/22/12 0633  BP: 127/61  Pulse: 65  Temp: 97.5 F (36.4 C)  Resp: 16   Gen: NAD, resting comfortably in bed  HEENT:  NCAT, MMM, PERRLA  CV: RRR  Lungs: CTAB  Abd: nondistended abdomen. Normal bowel sounds. No hepatosplenomegaly. Diffuse mild tenderness.  MSK: moves all extremities, trace edema  Skin: warm and dry, no rash  Neuro: alert and oriented x3, grossly normal      Procedures/Imaging:  Ct Abdomen Pelvis W Contrast  02/20/2012 IMPRESSION: Mild wall thickening in the region of the cecum/ileocecal valve, possibly infectious/inflammatory.  Given the history, colonoscopy is suggested to exclude an underlying colonic mass.  No evidence of bowel obstruction.  Normal appendix.  No CT findings to account for the patient's left abdominal pain.   Original Report Authenticated By: Charline Bills, M.D.     Labs  CBC  Lab 02/22/12 0635 02/21/12 0722 02/20/12 0848  WBC 4.0 3.1* 4.4  HGB 13.1 12.9 13.4  HCT 38.4 37.7 38.7  PLT 298 255 264   BMET  Lab 02/22/12 0635 02/21/12 0722 02/20/12 0848  NA 140 140 142  K 4.1 3.3* 4.2  CL 106 106 106  CO2 26 26 26   BUN 11 11 19   CREATININE 0.70 0.73 0.68  CALCIUM 9.1 8.6 9.6  PROT -- 6.6 7.6  BILITOT -- 0.3 0.2*  ALKPHOS -- 98 99  ALT -- 18 16  AST -- 18 22  GLUCOSE 111* 122* 100*   Stool Lactoferin - positive Stool Cx - Pending  Patient condition at time of discharge/disposition: stable  Disposition-home   Follow up issues: 1. Pt needs to follow up with GI with her UC. 2. F/U on stool Cx  Discharge follow up:   Discharge Orders    Future Appointments: Provider: Department: Dept Phone: Center:   02/24/2012 8:45 AM Tobey Grim, MD [facility] [phone] [facility]     Future Orders Please Complete By Expires   Diet - low sodium heart healthy      Increase activity slowly      Call MD for:  persistant nausea and vomiting      Call MD for:  extreme fatigue         Follow-up Information    Follow up with Freddy Jaksch, MD. Schedule an appointment as soon as possible for a visit in 2 weeks.   Contact information:    [address] [place] [address] [phone]       Follow up with WALDEN,JEFF, MD. In 2 days. (12/25/11 Appointment at 8:45 AM )    Contact information:   [address] [place] [address] [phone]             Discharge Instructions: Please refer to Patient Instructions section of EMR for full details.  Patient was counseled important signs and symptoms that should prompt return to medical care, changes in medications, dietary instructions, activity restrictions, and follow up appointments.  Significant instructions noted below:    Discharge Medications   Medication List     As of 02/22/2012  2:03 PM    START taking these medications         ciprofloxacin 500 MG tablet   Commonly known as: CIPRO   Take 1 tablet (500 mg total) by mouth 2 (two) times daily.      ondansetron 4 MG tablet   Commonly known as: ZOFRAN   Take 1 tablet (4 mg total) by mouth every 6 (six) hours as needed for nausea.      sulfaSALAzine 500 MG tablet   Commonly known as: AZULFIDINE   Take 2 tablets (1,000 mg total) by mouth 4 (four) times daily.      CONTINUE taking these medications         aspirin 325 MG buffered tablet      BC HEADACHE POWDER PO      ibuprofen 200 MG tablet   Commonly known as: ADVIL,MOTRIN          Where to get your medications    These are the prescriptions that you need to pick up. We sent them to a specific pharmacy, so you will need to go there to get them.   [facility] [address], [place] - [address] AT [facility] OF [facility] [address]    [address] [place] [address]    Phone: [phone]    Hours: 24-hours        ciprofloxacin 500 MG tablet   ondansetron 4 MG tablet   sulfaSALAzine 500 MG tablet                Gildardo Cranker, DO of [facility] 02/22/2012 2:04 PM

## 2012-02-22 NOTE — Discharge Summary (Signed)
FMTS Attending Note Patient seen and examined by me on the day of discharge, I agree with resident plan for discharge and I discussed this personally with the patient and with resident team.  Paula Compton, MD

## 2012-02-24 ENCOUNTER — Inpatient Hospital Stay: Payer: 59 | Admitting: Family Medicine

## 2012-02-25 LAB — STOOL CULTURE: Special Requests: NORMAL

## 2012-05-09 ENCOUNTER — Inpatient Hospital Stay (HOSPITAL_COMMUNITY)
Admission: EM | Admit: 2012-05-09 | Discharge: 2012-05-11 | DRG: 387 | Disposition: A | Discharge status: Home / Self Care | Payer: 59 | Attending: Family Medicine | Admitting: Family Medicine

## 2012-05-09 ENCOUNTER — Encounter (HOSPITAL_COMMUNITY): Payer: Self-pay | Admitting: Emergency Medicine

## 2012-05-09 ENCOUNTER — Emergency Department (HOSPITAL_COMMUNITY): Payer: 59

## 2012-05-09 DIAGNOSIS — K5190 Ulcerative colitis, unspecified, without complications: Secondary | ICD-10-CM

## 2012-05-09 DIAGNOSIS — K5289 Other specified noninfective gastroenteritis and colitis: Secondary | ICD-10-CM | POA: Diagnosis present

## 2012-05-09 DIAGNOSIS — K589 Irritable bowel syndrome without diarrhea: Secondary | ICD-10-CM

## 2012-05-09 DIAGNOSIS — R300 Dysuria: Secondary | ICD-10-CM | POA: Diagnosis present

## 2012-05-09 DIAGNOSIS — Z79899 Other long term (current) drug therapy: Secondary | ICD-10-CM

## 2012-05-09 DIAGNOSIS — D649 Anemia, unspecified: Secondary | ICD-10-CM | POA: Diagnosis present

## 2012-05-09 DIAGNOSIS — Z7982 Long term (current) use of aspirin: Secondary | ICD-10-CM

## 2012-05-09 LAB — URINALYSIS, ROUTINE W REFLEX MICROSCOPIC
Bilirubin Urine: NEGATIVE
Hgb urine dipstick: NEGATIVE
Nitrite: NEGATIVE
Specific Gravity, Urine: 1.008 (ref 1.005–1.030)
Urobilinogen, UA: 0.2 mg/dL (ref 0.0–1.0)
pH: 7 (ref 5.0–8.0)

## 2012-05-09 LAB — BASIC METABOLIC PANEL
BUN: 14 mg/dL (ref 6–23)
Chloride: 107 mEq/L (ref 96–112)
GFR calc Af Amer: >90 mL/min (ref >90)
GFR calc non Af Amer: >90 mL/min (ref >90)
Potassium: 3.6 mEq/L (ref 3.5–5.1)

## 2012-05-09 LAB — HEPATIC FUNCTION PANEL
ALT: 14 U/L (ref 0–35)
AST: 13 U/L (ref 0–37)
Bilirubin, Direct: <0.1 mg/dL (ref 0.0–0.3)
Total Protein: 7.3 g/dL (ref 6.0–8.3)

## 2012-05-09 LAB — CBC WITH DIFFERENTIAL/PLATELET
Basophils Absolute: 0 10*3/uL (ref 0.0–0.1)
Basophils Relative: 0 % (ref 0–1)
Eosinophils Absolute: 0.1 10*3/uL (ref 0.0–0.7)
MCH: 31.7 pg (ref 26.0–34.0)
MCHC: 33.7 g/dL (ref 30.0–36.0)
Neutro Abs: 2.2 10*3/uL (ref 1.7–7.7)
Neutrophils Relative %: 56 % (ref 43–77)
Platelets: 282 10*3/uL (ref 150–400)
RDW: 12.4 % (ref 11.5–15.5)

## 2012-05-09 LAB — CREATININE, SERUM: Creatinine, Ser: 0.62 mg/dL (ref 0.50–1.10)

## 2012-05-09 LAB — URINE MICROSCOPIC-ADD ON

## 2012-05-09 LAB — CBC
MCH: 32.8 pg (ref 26.0–34.0)
MCHC: 34.5 g/dL (ref 30.0–36.0)
Platelets: 299 10*3/uL (ref 150–400)
RBC: 4.69 MIL/uL (ref 3.87–5.11)

## 2012-05-09 MED ORDER — ONDANSETRON HCL 4 MG/2ML IJ SOLN
4.0000 mg | Freq: Three times a day (TID) | INTRAMUSCULAR | Status: DC | PRN
  Filled 2012-05-09: qty 2

## 2012-05-09 MED ORDER — MORPHINE SULFATE 4 MG/ML IJ SOLN
4.0000 mg | INTRAMUSCULAR | Status: DC | PRN
  Administered 2012-05-09 – 2012-05-10 (×4): 4 mg via INTRAVENOUS
  Filled 2012-05-09 (×4): qty 1

## 2012-05-09 MED ORDER — ONDANSETRON HCL 4 MG/2ML IJ SOLN
4.0000 mg | Freq: Once | INTRAMUSCULAR | Status: AC
  Administered 2012-05-09: 4 mg via INTRAVENOUS
  Filled 2012-05-09: qty 2

## 2012-05-09 MED ORDER — SODIUM CHLORIDE 0.9 % IV SOLN
INTRAVENOUS | Status: AC
  Administered 2012-05-09: 11:00:00 via INTRAVENOUS

## 2012-05-09 MED ORDER — HYDROMORPHONE HCL PF 1 MG/ML IJ SOLN
1.0000 mg | INTRAMUSCULAR | Status: DC | PRN
  Filled 2012-05-09: qty 1

## 2012-05-09 MED ORDER — METHYLPREDNISOLONE SODIUM SUCC 40 MG IJ SOLR
20.0000 mg | Freq: Three times a day (TID) | INTRAMUSCULAR | Status: DC
  Administered 2012-05-09 – 2012-05-10 (×5): 20 mg via INTRAVENOUS
  Filled 2012-05-09 (×9): qty 0.5

## 2012-05-09 MED ORDER — HEPARIN SODIUM (PORCINE) 5000 UNIT/ML IJ SOLN
5000.0000 [IU] | Freq: Three times a day (TID) | INTRAMUSCULAR | Status: DC
  Administered 2012-05-09 – 2012-05-10 (×4): 5000 [IU] via SUBCUTANEOUS
  Filled 2012-05-09 (×8): qty 1

## 2012-05-09 MED ORDER — SODIUM CHLORIDE 0.9 % IV SOLN
Freq: Once | INTRAVENOUS | Status: AC
  Administered 2012-05-09: 10 mL/h via INTRAVENOUS

## 2012-05-09 MED ORDER — ACETAMINOPHEN 650 MG RE SUPP: 650.0000 mg | Freq: Four times a day (QID) | RECTAL | Status: DC | PRN

## 2012-05-09 MED ORDER — ACETAMINOPHEN 325 MG PO TABS
650.0000 mg | ORAL_TABLET | Freq: Four times a day (QID) | ORAL | Status: DC | PRN
  Administered 2012-05-09 – 2012-05-11 (×2): 650 mg via ORAL
  Filled 2012-05-09 (×2): qty 2

## 2012-05-09 MED ORDER — SODIUM CHLORIDE 0.9 % IV BOLUS (SEPSIS)
500.0000 mL | Freq: Once | INTRAVENOUS | Status: AC
  Administered 2012-05-09: 500 mL via INTRAVENOUS

## 2012-05-09 MED ORDER — POLYETHYLENE GLYCOL 3350 17 G PO PACK
17.0000 g | PACK | Freq: Every day | ORAL | Status: DC | PRN
  Filled 2012-05-09: qty 1

## 2012-05-09 MED ORDER — MORPHINE SULFATE 4 MG/ML IJ SOLN
4.0000 mg | Freq: Once | INTRAMUSCULAR | Status: AC
  Administered 2012-05-09: 4 mg via INTRAVENOUS
  Filled 2012-05-09: qty 1

## 2012-05-09 MED ORDER — SODIUM CHLORIDE 0.9 % IV BOLUS (SEPSIS)
1000.0000 mL | Freq: Once | INTRAVENOUS | Status: AC
  Administered 2012-05-09: 1000 mL via INTRAVENOUS

## 2012-05-09 MED ORDER — PANTOPRAZOLE SODIUM 40 MG IV SOLR
40.0000 mg | INTRAVENOUS | Status: DC
  Administered 2012-05-09 – 2012-05-10 (×2): 40 mg via INTRAVENOUS
  Filled 2012-05-09 (×3): qty 40

## 2012-05-09 MED ORDER — SULFASALAZINE 500 MG PO TABS
1000.0000 mg | ORAL_TABLET | Freq: Four times a day (QID) | ORAL | Status: DC
  Administered 2012-05-09 (×2): 1000 mg via ORAL
  Filled 2012-05-09 (×11): qty 2

## 2012-05-09 MED ORDER — KCL IN DEXTROSE-NACL 10-5-0.45 MEQ/L-%-% IV SOLN
INTRAVENOUS | Status: DC
  Administered 2012-05-09 – 2012-05-11 (×6): via INTRAVENOUS
  Filled 2012-05-09 (×12): qty 1000

## 2012-05-09 MED ORDER — ONDANSETRON HCL 4 MG PO TABS: 4.0000 mg | ORAL_TABLET | Freq: Four times a day (QID) | ORAL | Status: DC | PRN

## 2012-05-09 MED ORDER — ONDANSETRON HCL 4 MG/2ML IJ SOLN
4.0000 mg | Freq: Four times a day (QID) | INTRAMUSCULAR | Status: DC | PRN
  Administered 2012-05-09 – 2012-05-10 (×3): 4 mg via INTRAVENOUS
  Filled 2012-05-09 (×3): qty 2

## 2012-05-09 MED ORDER — SENNA 8.6 MG PO TABS
1.0000 | ORAL_TABLET | Freq: Two times a day (BID) | ORAL | Status: DC
  Administered 2012-05-09 – 2012-05-11 (×3): 8.6 mg via ORAL
  Filled 2012-05-09 (×6): qty 1

## 2012-05-09 MED ORDER — METHYLPREDNISOLONE SODIUM SUCC 125 MG IJ SOLR
125.0000 mg | Freq: Once | INTRAMUSCULAR | Status: AC
  Administered 2012-05-09: 125 mg via INTRAVENOUS
  Filled 2012-05-09: qty 2

## 2012-05-09 MED ORDER — HYDROCODONE-ACETAMINOPHEN 5-325 MG PO TABS: 1.0000 | ORAL_TABLET | ORAL | Status: DC | PRN

## 2012-05-09 MED ORDER — BIOTENE DRY MOUTH MT LIQD
15.0000 mL | Freq: Two times a day (BID) | OROMUCOSAL | Status: DC
  Administered 2012-05-10 – 2012-05-11 (×2): 15 mL via OROMUCOSAL

## 2012-05-09 MED ORDER — METHYLPREDNISOLONE SODIUM SUCC 125 MG IJ SOLR: 125.0000 mg | Freq: Every day | INTRAMUSCULAR | Status: DC

## 2012-05-09 NOTE — H&P (Signed)
[facility] Admission History and Physical Service Pager: [phone]  Patient name: Crystal Benton Medical record number: [id number] Date of birth: 1960-07-29 Age: 52 y.o. Gender: female  Primary Care Provider: Annabell Sabal, MD  Chief Complaint: Abdominal Pain  History of Present Illness: Crystal Benton is a 52 y.o. year old female presenting with abd pain for 3 days. She had a black tarry stool two days ago, then a bloody stool the following day, with BRB on the stool and also with wiping. She the developed nasuea, vomiting, dizziness, and lightheadedness. The emesis is non-bloody and non-billious and has occurred anytime she has tried to eat or drink anything in the last 24 hours. Her pain started 3 days ago as a dull, crampy pain which progressed to a constant throbbing pain today. She has tried some naprosyn which helped the pain some. She has continued to use her sulfasalazine throughout this time. She denies chest pain, palpitations, vaginal discharge or pain, new sexual partners, and rash. She does have dysuria and polyuria.   Pt denies any family history or malignancy, especially GI. Denies hematemesis, unintentional wt loss    Patient Active Problem List  Diagnosis  . UNSPECIFIED ANEMIA  . IBD  . Gastroenteritis   Past Medical History: Past Medical History  Diagnosis Date  . Ulcerative colitis    Past Surgical History: Past Surgical History  Procedure Date  . Abdominal surgery    Social History: History  Substance Use Topics  . Smoking status: Never Smoker   . Smokeless tobacco: Never Used  . Alcohol Use: No   For any additional social history documentation, please refer to relevant sections of EMR.  Family History: No family history on file. Allergies: Allergies  Allergen Reactions  . Flagyl (Metronidazole) Other (See Comments)    Makes her feel like she's dying  . Metoclopramide Hcl Other (See Comments)    cramping   No current  facility-administered medications on file prior to encounter.   Current Outpatient Prescriptions on File Prior to Encounter  Medication Sig Dispense Refill  . aspirin 325 MG buffered tablet Take 650 mg by mouth daily as needed. For pain      . Aspirin-Salicylamide-Caffeine (BC HEADACHE POWDER PO) Take 1 Package by mouth daily as needed. headache      . ondansetron (ZOFRAN) 4 MG tablet Take 1 tablet (4 mg total) by mouth every 6 (six) hours as needed for nausea.  20 tablet  0  . sulfaSALAzine (AZULFIDINE) 500 MG tablet Take 2 tablets (1,000 mg total) by mouth 4 (four) times daily.  120 tablet  0   Review Of Systems: Per HPI, otherwise 12 point review of systems was performed and was unremarkable.  Physical Exam: BP 127/70  Pulse 58  Temp 97.4 F (36.3 C) (Oral)  Resp 14  Ht 5' 6"  (1.676 m)  Wt 205 lb (92.987 kg)  BMI 33.09 kg/m2  SpO2 100%  Exam: Gen: NAD, alert, cooperative with exam HEENT: NCAT, EOMI, PERRL, dry mucous membranes CV: RRR, good S1/S2, no murmur Resp: CTABL, no wheezes, non-labored Abd: Mildly diffusely tender to palpation with point tendernes to palpation LLQ, epigastric area, and suprapubic area. No rebound or murphy's sign, no pain at McBurney's point, Hypoactive BS, no guarding or organomegaly, mild L CVA tenderness Ext: No edema, warm Neuro: Alert and oriented,CN grossly intact. Moves all extremities spontaneously and symmetrically,  No gross deficits   Labs and Imaging: CBC BMET   Lab 05/09/12 0455  WBC 3.9*  HGB 13.2  HCT 39.2  PLT 282    Lab 05/09/12 0455  NA 140  K 3.6  CL 107  CO2 25  BUN 14  CREATININE 0.68  GLUCOSE 99  CALCIUM 9.2       05/09/2012 07:15  Color, Urine YELLOW  APPearance CLEAR  Specific Gravity, Urine 1.008  pH 7.0  Glucose NEGATIVE  Bilirubin Urine NEGATIVE  Ketones, ur NEGATIVE  Protein NEGATIVE  Urobilinogen, UA 0.2  Nitrite NEGATIVE  Leukocytes, UA TRACE (A)  Hgb urine dipstick NEGATIVE  WBC, UA 3-6    Squamous Epithelial / LPF RARE  Bacteria, UA RARE    DG abdomen 05/09/2012 IMPRESSION:  Enlargement of cardiac silhouette with pulmonary vascular congestion.  Minimal bronchitic changes and right upper lobe scarring. Increased stool in colon.  No evidence of bowel obstruction or perforation. Bilateral sacroiliitis.  Assessment and Plan: Crystal Benton is a 52 y.o. year old female w/ h/o of IBD presenting with abdominal pain, melena, and hematochezia.   Abdominal Pain: With her Hx of ulcerative colitis we will treat it as the most likely etiology. Other considerations are infectious colitis, pancreatitis, constipation, malignancy, and UTI However without a fever or leukocytosis infectious etiology is unlikely. We will check a lipase level to evaluate for pancreatitis.  Dg abdomen showing - no obstruction or perforation, increased stool. - Start Solumedrol 33m Q8,  - Continue sulfasalazine at home dose - CRP, ESR, Lipase - 1 L NS Bolus - C. Diff Ag (unlikely given normal white count and afebrile but recently on Cipro and at increased risk given UC hx) - Senna and miralax for increased stool burden.  - monitor with serial abdominal exams.  - Hemocult - +/- GI consult if workup negative and increased bloody stools - Protonix due to possible GI bleed  Dysuria and suprapubic pain - Possibly a UTI - UA with trace LE - Urine gram stain and culture treat as necessary.   FEN/GI: D5 1/2 NS with 10 K at 125 Prophylaxis: Subq heparin Disposition: Med surg, home when abdominal pain and other symptoms resolve   BKenn File MD 05/09/2012, 1:34 PM  --------------------------------------------------------------------------------------------  Upper level addendum  Pt seen and examined w/ Dr. BWendi Snipesand agree w/ plan. Additional comments made in blue  DLinna Darner MD Family Medicine PGY-2 05/09/2012, 5:23 PM

## 2012-05-09 NOTE — ED Notes (Signed)
Pt was given water and she has tolerated it well.

## 2012-05-09 NOTE — ED Notes (Signed)
Unable to obtain urine specimen at this time. Pt voided before specimen was obtained.

## 2012-05-09 NOTE — ED Notes (Signed)
MD at bedside. 

## 2012-05-09 NOTE — ED Notes (Signed)
Pt c/o abd pain, nausea x 3 days, chills, urinary retention, pt reports dark red stool tonight. A & O, PWD

## 2012-05-09 NOTE — ED Provider Notes (Signed)
History     CSN: [id number]  Arrival date & time 05/09/12  0354   First MD Initiated Contact with Patient 05/09/12 [phone]      Chief Complaint  Patient presents with  . Abdominal Pain    (Consider location/radiation/quality/duration/timing/severity/associated sxs/prior treatment) The history is provided by the patient.   patient presents with upper and lower abdominal pain. She states she's had it for 3 days. She also had some nausea and chills. She states she had some black stool yesterday and dark red stool the night. No clear fevers. No other bleeding. She has a history of ulcer colitis. No sick contacts. No chest pain. She has had some dysuria.  Past Medical History  Diagnosis Date  . Ulcerative colitis     Past Surgical History  Procedure Date  . Abdominal surgery     No family history on file.  History  Substance Use Topics  . Smoking status: Never Smoker   . Smokeless tobacco: Never Used  . Alcohol Use: No    OB History    Grav Para Term Preterm Abortions TAB SAB Ect Mult Living                  Review of Systems  Constitutional: Positive for chills and fatigue. Negative for activity change and appetite change.  HENT: Negative for neck stiffness.   Eyes: Negative for pain.  Respiratory: Negative for chest tightness and shortness of breath.   Cardiovascular: Negative for chest pain and leg swelling.  Gastrointestinal: Positive for nausea, abdominal pain and blood in stool. Negative for vomiting and diarrhea.  Genitourinary: Negative for flank pain.  Musculoskeletal: Negative for back pain.  Skin: Negative for rash.  Neurological: Negative for weakness, numbness and headaches.  Psychiatric/Behavioral: Negative for behavioral problems.    Allergies  Flagyl and Metoclopramide hcl  Home Medications   Current Outpatient Rx  Name  Route  Sig  Dispense  Refill  . ASPIRIN BUFFERED 325 MG PO TABS   Oral   Take 650 mg by mouth daily as needed. For pain         . BC HEADACHE POWDER PO   Oral   Take 1 Package by mouth daily as needed. headache         . ONDANSETRON HCL 4 MG PO TABS   Oral   Take 1 tablet (4 mg total) by mouth every 6 (six) hours as needed for nausea.   20 tablet   0   . SULFASALAZINE 500 MG PO TABS   Oral   Take 2 tablets (1,000 mg total) by mouth 4 (four) times daily.   120 tablet   0     BP 117/56  Pulse 74  Temp 98.1 F (36.7 C) (Oral)  Resp 20  Wt 205 lb 8 oz (93.214 kg)  SpO2 99%  Physical Exam  Nursing note and vitals reviewed. Constitutional: She is oriented to person, place, and time. She appears well-developed and well-nourished.  HENT:  Head: Normocephalic and atraumatic.  Eyes: EOM are normal. Pupils are equal, round, and reactive to light.  Neck: Normal range of motion. Neck supple.  Cardiovascular: Normal rate, regular rhythm and normal heart sounds.   No murmur heard. Pulmonary/Chest: Effort normal and breath sounds normal. No respiratory distress. She has no wheezes. She has no rales.  Abdominal: Soft. Bowel sounds are normal. She exhibits no distension. There is tenderness. There is no rebound and no guarding.       Upper  abdominal and suprapubic tenderness. No rebound guarding.  Musculoskeletal: Normal range of motion.  Neurological: She is alert and oriented to person, place, and time. No cranial nerve deficit.  Skin: Skin is warm and dry.  Psychiatric: She has a normal mood and affect. Her speech is normal.    ED Course  Procedures (including critical care time)  Labs Reviewed  CBC WITH DIFFERENTIAL - Abnormal; Notable for the following:    WBC 3.9 (*)     All other components within normal limits  BASIC METABOLIC PANEL  HEPATIC FUNCTION PANEL  URINALYSIS, ROUTINE W REFLEX MICROSCOPIC   Dg Abd Acute W/chest  05/09/2012  *RADIOLOGY REPORT*  Clinical Data: Abdominal pain, nausea, vomiting, GI bleeding, shortness of breath, history ulcerative colitis  ACUTE ABDOMEN SERIES  (ABDOMEN 2 VIEW & CHEST 1 VIEW)  Comparison: Chest radiograph 08/09/2011, abdominal radiographs 08/31/2010  Findings: Enlargement of cardiac silhouette with pulmonary vascular congestion. Mediastinal contours normal. Mild bronchitic changes with minimal scarring in right upper lobe. No definite infiltrate, pleural effusion or pneumothorax. Mildly increased stool throughout colon. No bowel dilatation, bowel wall thickening or free intraperitoneal air. Bilateral sacroiliitis. No urinary tract calcification or acute osseous findings.  IMPRESSION: Enlargement of cardiac silhouette with pulmonary vascular congestion. Minimal bronchitic changes and right upper lobe scarring. Increased stool in colon. No evidence of bowel obstruction or perforation. Bilateral sacroiliitis.   Original Report Authenticated By: Lavonia Dana, M.D.      1. Ulcerative colitis       MDM  Patient with upper and lower abdominal pain and likely blood in stool. Lab work is overall reassuring. She has a history of ulcer colitis. At this time urinalysis is pending. Hemoglobin is normal. Vitals appear stable at this time. Likely  consultation with GI once urine returns   Jasper Riling. Alvino Chapel, [place] 05/09/12 [phone]

## 2012-05-09 NOTE — ED Notes (Signed)
Pt alert and oriented x4. Respirations even and unlabored. Bilateral rise and fall of chest. Skin warm and dry. In no acute distress. Denies needs.  

## 2012-05-09 NOTE — ED Notes (Signed)
Patient transported to X-ray 

## 2012-05-09 NOTE — ED Provider Notes (Signed)
The patient's care was signed out to me at shift change awaiting urinalysis.  She has a history of UC and this seems to be a flareup of that.  She was hydrated, medicated, but is not feeling better.  I have spoken with Family Medicine who agrees to admit the patient.  She will be transferred to [facility] for admission.  Veryl Speak, MD 05/09/12 [phone]

## 2012-05-10 ENCOUNTER — Encounter: Payer: Self-pay | Admitting: Family Medicine

## 2012-05-10 LAB — BASIC METABOLIC PANEL
BUN: 10 mg/dL (ref 6–23)
Calcium: 8.7 mg/dL (ref 8.4–10.5)
Creatinine, Ser: 0.51 mg/dL (ref 0.50–1.10)
GFR calc Af Amer: >90 mL/min (ref >90)

## 2012-05-10 LAB — CBC
MCHC: 34.3 g/dL (ref 30.0–36.0)
MCV: 93.8 fL (ref 78.0–100.0)
Platelets: 294 10*3/uL (ref 150–400)
RDW: 12.4 % (ref 11.5–15.5)
WBC: 9.6 10*3/uL (ref 4.0–10.5)

## 2012-05-10 MED ORDER — TRAMADOL HCL 50 MG PO TABS
50.0000 mg | ORAL_TABLET | Freq: Four times a day (QID) | ORAL | Status: DC
  Administered 2012-05-11 (×2): 50 mg via ORAL
  Filled 2012-05-10 (×5): qty 1

## 2012-05-10 NOTE — H&P (Signed)
I examined this patient and discussed the care plan with Dr Konrad Dolores and the [facility] team and agree with assessment and plan as documented in the admission note above. She reports not having received Morphine since early this AM and says that her current pain is 7-8/10 in the lower abdomen. She states that her ulcerative colitis never causes diarrhea, but she is often constipated with blood in the toilet water and on the toilet paper. Her gastroenterologist is Dr Dulce Sellar. She is smiling and pleasantly interactive. Her abdomen is quiet and she reports lower abdominal pain with cough, but it is soft and without guarding, though tender in the bilateral lower abdomen.

## 2012-05-10 NOTE — Progress Notes (Signed)
Unable to tolerate po meds due to nausea.  MD notified.

## 2012-05-10 NOTE — Progress Notes (Signed)
Family Medicine Teaching Service Daily Progress Note Intern Pager: [phone]  Patient name: Crystal Benton Medical record number: [id number] Date of birth: August 12, 1960 Age: 52 y.o. Gender: female  Primary Care Provider: Renold Don, MD  Subjective: Pt seen at bedside. Still with significant abdominal pain, though pain medication is helping and allowing pt to rest. Still no further BM's (last was Friday, small and not frankly bloody). Nausea helped with Zofran, but pt states taking sulffasalazine without food "makes her sick." No pain or SOB, otherwise. No current frank GI bleeds (emesis or stool/rectally).  Objective: Temp:  [97.6 F (36.4 C)-98 F (36.7 C)] 97.6 F (36.4 C) (01/05 1478) Pulse Rate:  [52-97] 52  (01/05 0624) Resp:  [16-17] 17  (01/05 0608) BP: (97-121)/(42-70) 105/58 mmHg (01/05 0624) SpO2:  [95 %-99 %] 98 % (01/05 2956) Exam: General: adult female, lying in bed in NAD, cooperative with exam; initially sleeping but easily rousable Cardiovascular: RRR, normal S1/S2, no murmur appreciated Respiratory: CTAB, no wheezes, good air movement bilaterally Abdomen: diffuse tender to palpation, worse in low bilateral abdominal quadrants and suprapubically Extremities: no LE edema, moves all extremities equally, no gross focal deficits  Laboratory:  Lab 05/10/12 0740 05/09/12 1554 05/09/12 0455  WBC 9.6 5.6 3.9*  HGB 12.5 15.4* 13.2  HCT 36.4 44.7 39.2  PLT 294 299 282    Lab 05/10/12 0740 05/09/12 1554 05/09/12 0455  NA 139 -- 140  K 3.9 -- 3.6  CL 109 -- 107  CO2 21 -- 25  BUN 10 -- 14  CREATININE 0.51 0.62 0.68  CALCIUM 8.7 -- 9.2  PROT -- -- 7.3  BILITOT -- -- 0.3  ALKPHOS -- -- 84  ALT -- -- 14  AST -- -- 13  GLUCOSE 138* -- 99    05/09/2012 07:15  Color, Urine YELLOW  APPearance CLEAR  Specific Gravity, Urine 1.008  pH 7.0  Glucose NEGATIVE  Bilirubin Urine NEGATIVE  Ketones, ur NEGATIVE  Protein NEGATIVE  Urobilinogen, UA 0.2  Nitrite NEGATIVE    Leukocytes, UA TRACE (A)  Hgb urine dipstick NEGATIVE  WBC, UA 3-6  Squamous Epithelial / LPF RARE  Bacteria, UA RARE  Urine culture (collected 1/4 at 2159): PENDING  CRP 1/4: 2.3 ESR 1/4: 45  Imaging/Diagnostic Tests: Acute abdominal xray, 1/4 @0715  Findings:  Enlargement of cardiac silhouette with pulmonary vascular  congestion.  Mediastinal contours normal.  Mild bronchitic changes with minimal scarring in right upper lobe.  No definite infiltrate, pleural effusion or pneumothorax.  Mildly increased stool throughout colon.  No bowel dilatation, bowel wall thickening or free intraperitoneal  air.  Bilateral sacroiliitis.  No urinary tract calcification or acute osseous findings.  IMPRESSION:  Enlargement of cardiac silhouette with pulmonary vascular  congestion.  Minimal bronchitic changes and right upper lobe scarring.  Increased stool in colon.  No evidence of bowel obstruction or perforation.  Bilateral sacroiliitis.  Assessment and Plan: Crystal Benton is a 52 y.o. year old female w/ h/o of ulcerative colitis presenting with abdominal pain, melena, and hematochezia. Admitted 1/4.  #Abdominal Pain: likely flare of UC, given history. DDx includes infectious colitis, pancreatitis, constipation, malignancy, and UTI. No fever or leukocytosis makes infectious etiology less likely. Lipase 25, ESR 45 (H), CRP 2.3 (H). Abdominal film: no obstruction or perforation, increased stool.  - Continue Solumedrol 20mg  Q8, continue sulfasalazine at home dose (1g 4x daily) - C. Diff and hemoccult pending (pt has had no BM as of yet) - Continue Senna and Miralax for increased stool  burden.  - Continue Protonix due to possible GI bleed - Previously on morphine 4 mg q4 PRN; given hx of UC, will change narcotics to Tramadol 50 mg q6 PRN - Will consider GI consult especially if pt has any further bloody BM  #Dysuria and suprapubic pain  - Possibly a UTI, but unlikely given only trace LE on UA   - Will follow-up urine culture and treat as appropriate  FEN/GI: previously NPO, will advance to clears to see how pt tolerates; D5 1/2 NS with 10 K at 125  Prophylaxis: Subq heparin  Disposition: Management as above. Possible DC home today with close GI follow up if pain control is improved and pt is able to tolerate PO  Crystal Benton, [place], MD 05/10/2012, 11:07 AM

## 2012-05-10 NOTE — Progress Notes (Signed)
I examined this patient and discussed the care plan with Street and the [facility] team and agree with assessment and plan as documented in the progress note above. Since she continues lower abdominal pain, she could be prescribed Tramadol. I explained to her the relative contraindication of narcotics that can cause constipation and cover-up signs of perforation or megacolon.

## 2012-05-11 LAB — URINE CULTURE

## 2012-05-11 LAB — CBC
Hemoglobin: 12.4 g/dL (ref 12.0–15.0)
MCHC: 34.2 g/dL (ref 30.0–36.0)
Platelets: 273 10*3/uL (ref 150–400)
RBC: 3.82 MIL/uL — ABNORMAL LOW (ref 3.87–5.11)

## 2012-05-11 MED ORDER — POLYETHYLENE GLYCOL 3350 17 G PO PACK: 17.0000 g | PACK | Freq: Every day | ORAL | Status: DC | PRN

## 2012-05-11 MED ORDER — TRAMADOL HCL 50 MG PO TABS: 50.0000 mg | ORAL_TABLET | Freq: Four times a day (QID) | ORAL | Status: DC

## 2012-05-11 MED ORDER — PREDNISONE 20 MG PO TABS
20.0000 mg | ORAL_TABLET | Freq: Every day | ORAL | Status: DC
  Administered 2012-05-11: 20 mg via ORAL
  Filled 2012-05-11 (×2): qty 1

## 2012-05-11 MED ORDER — PREDNISONE 20 MG PO TABS: 20.0000 mg | ORAL_TABLET | Freq: Every day | ORAL | Status: DC

## 2012-05-11 MED FILL — Ondansetron HCl Inj 4 MG/2ML (2 MG/ML): INTRAMUSCULAR | Qty: 2 | Status: AC

## 2012-05-11 NOTE — Progress Notes (Signed)
Patient case discussed with resident team and I agree with Dr Timothy Lasso assessment and plan for today.  Paula Compton, MD

## 2012-05-11 NOTE — Progress Notes (Signed)
Pt discharged to home discharge instructions explained pt verbalized understanding 

## 2012-05-11 NOTE — Progress Notes (Signed)
Family Medicine Teaching Service Daily Progress Note Intern Pager: [phone]  Patient name: Crystal Benton Medical record number: [id number] Date of birth: 09/05/1960 Age: 52 y.o. Gender: female  Primary Care Provider: Renold Don, MD  Subjective: Pt seen at bedside. Abdominal pain markedly improved and pt requests regular food. States she had a small, nonbloody, nonloose stool, last night. No fevers, nausea much improved and not as much "feeling sick" when taking medicines with clear liquid foods. States she would like to go home; has questions about GI follow up and possible colonoscopy.  Objective: Temp:  [98.4 F (36.9 C)-98.7 F (37.1 C)] 98.4 F (36.9 C) (01/06 0635) Pulse Rate:  [63-68] 63  (01/06 0635) Resp:  [17-19] 17  (01/06 0635) BP: (128-151)/(60-74) 128/60 mmHg (01/06 0635) SpO2:  [98 %-99 %] 99 % (01/06 1478) Exam: General: adult female, standing/sitting up at bedside in NAD, cooperative with exam Cardiovascular: RRR, normal S1/S2, no murmur appreciated Respiratory: CTAB, no wheezes, good air movement bilaterally Abdomen: diffusely tender to palpation, very mild; much improved from previous exams; BS+ Extremities: no LE edema, moves all extremities equally, no gross focal deficits  Laboratory:  Lab 05/11/12 0635 05/10/12 0740 05/09/12 1554  WBC 10.2 9.6 5.6  HGB 12.4 12.5 15.4*  HCT 36.3 36.4 44.7  PLT 273 294 299    Lab 05/10/12 0740 05/09/12 1554 05/09/12 0455  NA 139 -- 140  K 3.9 -- 3.6  CL 109 -- 107  CO2 21 -- 25  BUN 10 -- 14  CREATININE 0.51 0.62 0.68  CALCIUM 8.7 -- 9.2  PROT -- -- 7.3  BILITOT -- -- 0.3  ALKPHOS -- -- 84  ALT -- -- 14  AST -- -- 13  GLUCOSE 138* -- 99    05/09/2012 07:15  Color, Urine YELLOW  APPearance CLEAR  Specific Gravity, Urine 1.008  pH 7.0  Glucose NEGATIVE  Bilirubin Urine NEGATIVE  Ketones, ur NEGATIVE  Protein NEGATIVE  Urobilinogen, UA 0.2  Nitrite NEGATIVE  Leukocytes, UA TRACE (A)  Hgb urine dipstick  NEGATIVE  WBC, UA 3-6  Squamous Epithelial / LPF RARE  Bacteria, UA RARE  Urine culture (collected 1/4 at 2159): 20,000 CFU/mL, multiple morphotypes  CRP 1/4: 2.3 ESR 1/4: 45  Stool Studies C. Diff, 1/5: negative FOBT, 1/5: negative  Imaging/Diagnostic Tests: Acute abdominal xray, 1/4 @0715  Findings:  Enlargement of cardiac silhouette with pulmonary vascular  congestion.  Mediastinal contours normal.  Mild bronchitic changes with minimal scarring in right upper lobe.  No definite infiltrate, pleural effusion or pneumothorax.  Mildly increased stool throughout colon.  No bowel dilatation, bowel wall thickening or free intraperitoneal  air.  Bilateral sacroiliitis.  No urinary tract calcification or acute osseous findings.  IMPRESSION:  Enlargement of cardiac silhouette with pulmonary vascular  congestion.  Minimal bronchitic changes and right upper lobe scarring.  Increased stool in colon.  No evidence of bowel obstruction or perforation.  Bilateral sacroiliitis.  Assessment and Plan: Crystal Benton is a 52 y.o. year old female w/ h/o of ulcerative colitis presenting with abdominal pain, melena, and hematochezia. Admitted 1/4.  #Abdominal Pain: likely flare of UC, given history. DDx includes infectious colitis, pancreatitis, constipation, malignancy, and UTI. No fever or leukocytosis makes infectious etiology less likely. Lipase 25, ESR 45 (H), CRP 2.3 (H). Abdominal film: no obstruction or perforation, increased stool.  - Solumedrol transitioned to prednisone 20 mg PO daily, continue sulfasalazine at home dose (1g 4x daily)  -pt to see Dr. Dulce Sellar (primary GI doctor) on 1/16  -  to continue prednisone until then, then likely taper - C. Diff and hemoccult both negative - Continue Senna and Miralax for increased stool burden.  - Continue Protonix due to possible GI bleed; will prescribe at discharge - Previously on morphine 4 mg q4 PRN; given hx of UC, now on Tramadol 50 mg q6 PRN,  good control of pain - Pt to have close GI f/u as above  #Dysuria and suprapubic pain  - unlikely given only trace LE on UA and culture not suggestive of pathogen - pain improved, most likely related to flare of UC, as above  FEN/GI: pt tolerating PO, advance as tolerated; fluids to KVO Prophylaxis: Subq heparin  Disposition: Management as above. D/C home this afternoon, with f/u as above with GI and Dr. Gwendolyn Grant in [facility] [place], [place] 05/11/2012, 8:28 AM

## 2012-05-11 NOTE — Discharge Summary (Signed)
Family Medicine Teaching [facility] Discharge Summary  Patient name: [name] Medical record number: [id number] Date of birth: 27-Nov-1960 Age: 52 y.o. Gender: female Date of Admission: 05/09/2012  Date of Discharge: 05/11/2012 Admitting Physician: Tobin Chad, MD  Primary Care Provider: Renold Don, MD  Indication for Hospitalization: abdominal pain, recent bloody bowel movement Discharge Diagnoses:  IBD / Ulcerative colitis  Consultations: none  Significant Labs and Imaging:  Na 140 > 139 K 3.6 > 3.9 Cl 107 > 109 CO2 25 > 21 BUN 14 > 10 Cr 0.68 > 0.62 > 0.51 Glu 84 > 138 WBC 3.9 > 5.6 > 9.6 > 10.2 Hb 13.2 > 15.4 > 12.5 >12.4  CRP, 1/4: 2.3 ESR, 1/4: 45  FOBT negative C.diff, collected 1/5: negative   05/09/2012 07:15  Color, Urine YELLOW  APPearance CLEAR  Specific Gravity, Urine 1.008  pH 7.0  Glucose NEGATIVE  Bilirubin Urine NEGATIVE  Ketones, ur NEGATIVE  Protein NEGATIVE  Urobilinogen, UA 0.2  Nitrite NEGATIVE  Leukocytes, UA TRACE (A)  Hgb urine dipstick NEGATIVE  WBC, UA 3-6  Squamous Epithelial / LPF RARE  Bacteria, UA RARE   Urine culture, 1/5 collected at 1322: 20k CFU/mL, no predominant morphotype  Procedures: none  Brief Hospital Course: Crystal Benton is a 52 y.o. year old female w/ h/o of ulcerative colitis presenting with abdominal pain, melena, and hematochezia. Admitted 1/4. Pt had a small, non-bloody bowel movement while admitted and C.diff was negative. Pt also had marked improvement of pain. See below by problem list.  #Abdominal Pain: likely flare of UC, given history. DDx includes infectious colitis, pancreatitis, constipation, malignancy, and UTI. No fever or leukocytosis makes infectious etiology less likely. Lipase 25, ESR 45 (H), CRP 2.3 (H). Abdominal film: no obstruction or perforation, increased stool.  - Solumedrol transitioned to prednisone 20 mg PO daily, continued sulfasalazine at home dose (1g 4x daily)   -pt to see Dr.  Dulce Sellar (primary GI doctor) on 1/16   -pt instructed to continue prednisone until then, then likely taper  - C. Diff and hemoccult both negative   - Continued Senna and Miralax for increased stool burden; recommended OTC meds at home  - Continue Protonix due to possible GI bleed; prescribed at discharge - Previously on morphine 4 mg q4 PRN; given hx of UC, changed to Tramadol 50 mg q6 PRN, good control of pain  -prescribed Tramadol at discharge  -pt states she also has Tramadol at home from previous Rx's - Pt to have close GI f/u as above   #Dysuria and suprapubic pain  - unlikely given only trace LE on UA and culture not suggestive of pathogen  - pain improved at time of discharge, most likely related to flare of UC, as above  Discharge Medications:    Medication List     As of 05/13/2012 11:59 AM    STOP taking these medications         aspirin 325 MG buffered tablet      TAKE these medications         BC HEADACHE POWDER PO   Take 1 Package by mouth daily as needed. headache      ondansetron 4 MG tablet   Commonly known as: ZOFRAN   Take 1 tablet (4 mg total) by mouth every 6 (six) hours as needed for nausea.      polyethylene glycol packet   Commonly known as: MIRALAX / GLYCOLAX   Take 17 g by mouth daily as needed.  predniSONE 20 MG tablet   Commonly known as: DELTASONE   Take 1 tablet (20 mg total) by mouth daily. Take one daily at least until 1/16 after you see Dr. Dulce Sellar.      sulfaSALAzine 500 MG tablet   Commonly known as: AZULFIDINE   Take 2 tablets (1,000 mg total) by mouth 4 (four) times daily.      traMADol 50 MG tablet   Commonly known as: ULTRAM   Take 1 tablet (50 mg total) by mouth every 6 (six) hours.       Disposition: discharge home  Issues for Follow Up: 1. Ulcerative colitis - Possible mild flare. No further bloody BM. Pt discharged with Rx for prednisone 20 mg daily, at least until she follows up with Dr. Dulce Sellar. Likely will need taper  after that; stressed importance of compliance with meds and dangers of abruptly stopping prednisone. Please evaluate any further symptoms and ensure good follow-up. 2. Dysuria - UA and urine culture not suggestive of UTI. Suprapubic pain most likely related to mild constipation/UC flare. Please evaluate for any further symptoms.  Outstanding Results: none  Discharge Instructions: Please refer to Patient Instructions section of EMR for full details.  Patient was counseled important signs and symptoms that should prompt return to medical care, changes in medications, dietary instructions, activity restrictions, and follow up appointments.       Follow-up Information    Follow up with Freddy Jaksch, MD. On 05/21/2012. (Appt time is 3:15 PM)    Contact information:   [address] [place] [address] [phone]       Follow up with [facility], [place]. On 05/18/2012. (Appt time is 1:30 PM, please arrive by 1:15.)    Contact information:   [address] [place] [address] [phone]         Discharge Condition: Crystal Benton, Cristal Deer, MD 05/13/2012, 11:59 AM

## 2012-05-18 ENCOUNTER — Inpatient Hospital Stay: Payer: 59 | Admitting: Family Medicine

## 2012-06-05 ENCOUNTER — Other Ambulatory Visit: Payer: Self-pay | Admitting: Gastroenterology

## 2012-08-31 ENCOUNTER — Ambulatory Visit (INDEPENDENT_AMBULATORY_CARE_PROVIDER_SITE_OTHER): Payer: 59 | Admitting: Emergency Medicine

## 2012-08-31 VITALS — BP 132/79 | HR 74 | Temp 97.8°F | Resp 18 | Ht 67.0 in | Wt 210.0 lb

## 2012-08-31 DIAGNOSIS — R609 Edema, unspecified: Secondary | ICD-10-CM

## 2012-08-31 LAB — COMPREHENSIVE METABOLIC PANEL
BUN: 8 mg/dL (ref 6–23)
CO2: 25 mEq/L (ref 19–32)
Calcium: 9.5 mg/dL (ref 8.4–10.5)
Chloride: 106 mEq/L (ref 96–112)
Creat: 0.61 mg/dL (ref 0.50–1.10)
Glucose, Bld: 78 mg/dL (ref 70–99)
Total Bilirubin: 0.4 mg/dL (ref 0.3–1.2)

## 2012-08-31 LAB — POCT URINALYSIS DIPSTICK
Bilirubin, UA: NEGATIVE
Glucose, UA: NEGATIVE
Nitrite, UA: NEGATIVE
Urobilinogen, UA: 0.2

## 2012-08-31 LAB — POCT CBC
HCT, POC: 42.7 % (ref 37.7–47.9)
Hemoglobin: 13.2 g/dL (ref 12.2–16.2)
Lymph, poc: 1.5 (ref 0.6–3.4)
MCH, POC: 30.7 pg (ref 27–31.2)
MCHC: 30.9 g/dL — AB (ref 31.8–35.4)
MCV: 99.3 fL — AB (ref 80–97)
MPV: 8.6 fL (ref 0–99.8)
POC MID %: 9.7 %M (ref 0–12)
RBC: 4.3 M/uL (ref 4.04–5.48)
WBC: 4.5 10*3/uL — AB (ref 4.6–10.2)

## 2012-08-31 LAB — TSH: TSH: 1.35 u[IU]/mL (ref 0.350–4.500)

## 2012-08-31 NOTE — Patient Instructions (Addendum)
Edema Edema is an abnormal build-up of fluids in tissues. Because this is partly dependent on gravity (water flows to the lowest place), it is more common in the legs and thighs (lower extremities). It is also common in the looser tissues, like around the eyes. Painless swelling of the feet and ankles is common and increases as a person ages. It may affect both legs and may include the calves or even thighs. When squeezed, the fluid may move out of the affected area and may leave a dent for a few moments. CAUSES   Prolonged standing or sitting in one place for extended periods of time. Movement helps pump tissue fluid into the veins, and absence of movement prevents this, resulting in edema.  Varicose veins. The valves in the veins do not work as well as they should. This causes fluid to leak into the tissues.  Fluid and salt overload.  Injury, burn, or surgery to the leg, ankle, or foot, may damage veins and allow fluid to leak out.  Sunburn damages vessels. Leaky vessels allow fluid to go out into the sunburned tissues.  Allergies (from insect bites or stings, medications or chemicals) cause swelling by allowing vessels to become leaky.  Protein in the blood helps keep fluid in your vessels. Low protein, as in malnutrition, allows fluid to leak out.  Hormonal changes, including pregnancy and menstruation, cause fluid retention. This fluid may leak out of vessels and cause edema.  Medications that cause fluid retention. Examples are sex hormones, blood pressure medications, steroid treatment, or anti-depressants.  Some illnesses cause edema, especially heart failure, kidney disease, or liver disease.  Surgery that cuts veins or lymph nodes, such as surgery done for the heart or for breast cancer, may result in edema. DIAGNOSIS  Your caregiver is usually easily able to determine what is causing your swelling (edema) by simply asking what is wrong (getting a history) and examining you (doing  a physical). Sometimes x-rays, EKG (electrocardiogram or heart tracing), and blood work may be done to evaluate for underlying medical illness. TREATMENT  General treatment includes:  Leg elevation (or elevation of the affected body part).  Restriction of fluid intake.  Prevention of fluid overload.  Compression of the affected body part. Compression with elastic bandages or support stockings squeezes the tissues, preventing fluid from entering and forcing it back into the blood vessels.  Diuretics (also called water pills or fluid pills) pull fluid out of your body in the form of increased urination. These are effective in reducing the swelling, but can have side effects and must be used only under your caregiver's supervision. Diuretics are appropriate only for some types of edema. The specific treatment can be directed at any underlying causes discovered. Heart, liver, or kidney disease should be treated appropriately. HOME CARE INSTRUCTIONS   Elevate the legs (or affected body part) above the level of the heart, while lying down.  Avoid sitting or standing still for prolonged periods of time.  Avoid putting anything directly under the knees when lying down, and do not wear constricting clothing or garters on the upper legs.  Exercising the legs causes the fluid to work back into the veins and lymphatic channels. This may help the swelling go down.  The pressure applied by elastic bandages or support stockings can help reduce ankle swelling.  A low-salt diet may help reduce fluid retention and decrease the ankle swelling.  Take any medications exactly as prescribed. SEEK MEDICAL CARE IF:  Your edema is   not responding to recommended treatments. SEEK IMMEDIATE MEDICAL CARE IF:   You develop shortness of breath or chest pain.  You cannot breathe when you lay down; or if, while lying down, you have to get up and go to the window to get your breath.  You are having increasing  swelling without relief from treatment.  You develop a fever over 102 F (38.9 C).  You develop pain or redness in the areas that are swollen.  Tell your caregiver right away if you have gained 3 lb/1.4 kg in 1 day or 5 lb/2.3 kg in a week. MAKE SURE YOU:   Understand these instructions.  Will watch your condition.  Will get help right away if you are not doing well or get worse. Document Released: 04/22/2005 Document Revised: 10/22/2011 Document Reviewed: 12/09/2007 Exit[employer] Patient Information 2013 [employer].  

## 2012-08-31 NOTE — Progress Notes (Signed)
[facility] [address], [place] [address] [phone]  Date:  08/31/2012   Name:  Crystal Benton   DOB:  1961/04/28   MRN:  [id number]  PCP:  No primary provider on file.    Chief Complaint: Foot Pain and Foot Swelling   History of Present Illness:  Crystal Benton is a 52 y.o. very pleasant female patient who presents with the following:  Thursday noticed that her feet were swollen and tender and that worsened over the weekend.  Denies antecedent illness or medication use.  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  No cardiac, renal or liver disease.  Non smoker.  No excessive alcohol use.  No recent travel or immobilization, anesthesia, etc.  No risks for DVT.  No history of orthopnea or PND, shortness of breath or wheezing.  Denies other complaint or health concern today.   Patient Active Problem List   Diagnosis Date Noted  . UNSPECIFIED ANEMIA 01/24/2009  . IBD 01/24/2009    Past Medical History  Diagnosis Date  . Ulcerative colitis     Past Surgical History  Procedure Laterality Date  . Abdominal surgery    . Colon surgery      History  Substance Use Topics  . Smoking status: Never Smoker   . Smokeless tobacco: Never Used  . Alcohol Use: No    Family History  Problem Relation Age of Onset  . Hypertension Mother   . Stroke Mother   . Multiple sclerosis Sister     Allergies  Allergen Reactions  . Flagyl (Metronidazole) Other (See Comments)    Makes her feel like she's dying  . Metoclopramide Hcl Other (See Comments)    cramping    Medication list has been reviewed and updated.  Current Outpatient Prescriptions on File Prior to Visit  Medication Sig Dispense Refill  . Aspirin-Salicylamide-Caffeine (BC HEADACHE POWDER PO) Take 1 Package by mouth daily as needed. headache      . ondansetron (ZOFRAN) 4 MG tablet Take 1 tablet (4 mg total) by mouth every 6 (six) hours as needed for nausea.  20 tablet  0  . polyethylene glycol  (MIRALAX / GLYCOLAX) packet Take 17 g by mouth daily as needed.  14 each    . predniSONE (DELTASONE) 20 MG tablet Take 1 tablet (20 mg total) by mouth daily. Take one daily at least until 1/16 after you see Dr. Dulce Sellar.  30 tablet  0  . sulfaSALAzine (AZULFIDINE) 500 MG tablet Take 2 tablets (1,000 mg total) by mouth 4 (four) times daily.  120 tablet  0  . traMADol (ULTRAM) 50 MG tablet Take 1 tablet (50 mg total) by mouth every 6 (six) hours.  60 tablet  0   No current facility-administered medications on file prior to visit.    Review of Systems:  As per HPI, otherwise negative.    Physical Examination: Filed Vitals:   08/31/12 1102  BP: 132/79  Pulse: 74  Temp: 97.8 F (36.6 C)  Resp: 18   Filed Vitals:   08/31/12 1102  Height: 5\' 7"  (1.702 m)  Weight: 210 lb (95.255 kg)   Body mass index is 32.88 kg/(m^2). Ideal Body Weight: Weight in (lb) to have BMI = 25: 159.3  GEN: WDWN, NAD, Non-toxic, A & O x 3 HEENT: Atraumatic, Normocephalic. Neck supple. No masses, No LAD. Ears and Nose: No external deformity. CV: RRR, No M/G/R. No JVD. No thrill. No extra heart  sounds. PULM: CTA B, no wheezes, crackles, rhonchi. No retractions. No resp. distress. No accessory muscle use. ABD: S, NT, ND, +BS. No rebound. No HSM. EXTR: No c/c/  Trace edema NEURO Normal gait.  PSYCH: Normally interactive. Conversant. Not depressed or anxious appearing.  Calm demeanor.    Assessment and Plan: Edema Labs Follow up based on labs   Signed,  Phillips Odor, MD   Results for orders placed in visit on 08/31/12  POCT CBC      Result Value Range   WBC 4.5 (*) 4.6 - 10.2 K/uL   Lymph, poc 1.5  0.6 - 3.4   POC LYMPH PERCENT 33.8  10 - 50 %L   MID (cbc) 0.4  0 - 0.9   POC MID % 9.7  0 - 12 %M   POC Granulocyte 2.5  2 - 6.9   Granulocyte percent 56.5  37 - 80 %G   RBC 4.30  4.04 - 5.48 M/uL   Hemoglobin 13.2  12.2 - 16.2 g/dL   HCT, POC 16.1  09.6 - 47.9 %   MCV 99.3 (*) 80 - 97 fL    MCH, POC 30.7  27 - 31.2 pg   MCHC 30.9 (*) 31.8 - 35.4 g/dL   RDW, POC 04.5     Platelet Count, POC 300  142 - 424 K/uL   MPV 8.6  0 - 99.8 fL  POCT URINALYSIS DIPSTICK      Result Value Range   Color, UA yellow     Clarity, UA cloudy     Glucose, UA neg     Bilirubin, UA neg     Ketones, UA neg     Spec Grav, UA 1.020     Blood, UA large     pH, UA 7.0     Protein, UA neg     Urobilinogen, UA 0.2     Nitrite, UA neg     Leukocytes, UA Trace

## 2012-09-02 NOTE — Progress Notes (Signed)
Reviewed and agree.

## 2012-11-08 ENCOUNTER — Encounter (HOSPITAL_COMMUNITY): Payer: Self-pay

## 2012-11-08 ENCOUNTER — Emergency Department (HOSPITAL_COMMUNITY)
Admission: EM | Admit: 2012-11-08 | Discharge: 2012-11-08 | Disposition: A | Discharge status: Home / Self Care | Payer: 59 | Attending: Emergency Medicine | Admitting: Emergency Medicine

## 2012-11-08 ENCOUNTER — Emergency Department (HOSPITAL_COMMUNITY): Payer: 59

## 2012-11-08 DIAGNOSIS — R109 Unspecified abdominal pain: Secondary | ICD-10-CM | POA: Insufficient documentation

## 2012-11-08 DIAGNOSIS — Z8719 Personal history of other diseases of the digestive system: Secondary | ICD-10-CM | POA: Insufficient documentation

## 2012-11-08 DIAGNOSIS — R112 Nausea with vomiting, unspecified: Secondary | ICD-10-CM | POA: Insufficient documentation

## 2012-11-08 DIAGNOSIS — Z9889 Other specified postprocedural states: Secondary | ICD-10-CM | POA: Insufficient documentation

## 2012-11-08 LAB — CBC WITH DIFFERENTIAL/PLATELET
Basophils Absolute: 0 10*3/uL (ref 0.0–0.1)
Basophils Relative: 0 % (ref 0–1)
Eosinophils Absolute: 0.1 10*3/uL (ref 0.0–0.7)
Eosinophils Relative: 3 % (ref 0–5)
HCT: 37.4 % (ref 36.0–46.0)
Hemoglobin: 12.9 g/dL (ref 12.0–15.0)
Lymphocytes Relative: 31 % (ref 12–46)
Lymphs Abs: 1.4 10*3/uL (ref 0.7–4.0)
MCH: 32 pg (ref 26.0–34.0)
MCHC: 34.5 g/dL (ref 30.0–36.0)
MCV: 92.8 fL (ref 78.0–100.0)
Monocytes Absolute: 0.3 10*3/uL (ref 0.1–1.0)
Monocytes Relative: 8 % (ref 3–12)
Neutro Abs: 2.6 10*3/uL (ref 1.7–7.7)
Neutrophils Relative %: 59 % (ref 43–77)
Platelets: 254 10*3/uL (ref 150–400)
RBC: 4.03 MIL/uL (ref 3.87–5.11)
RDW: 12.5 % (ref 11.5–15.5)
WBC: 4.4 10*3/uL (ref 4.0–10.5)

## 2012-11-08 LAB — COMPREHENSIVE METABOLIC PANEL
ALT: 15 U/L (ref 0–35)
AST: 14 U/L (ref 0–37)
Albumin: 3.6 g/dL (ref 3.5–5.2)
Alkaline Phosphatase: 94 U/L (ref 39–117)
BUN: 11 mg/dL (ref 6–23)
CO2: 25 mEq/L (ref 19–32)
Calcium: 9.4 mg/dL (ref 8.4–10.5)
Chloride: 106 mEq/L (ref 96–112)
Creatinine, Ser: 0.65 mg/dL (ref 0.50–1.10)
GFR calc Af Amer: >90 mL/min (ref >90)
GFR calc non Af Amer: >90 mL/min (ref >90)
Glucose, Bld: 104 mg/dL — ABNORMAL HIGH (ref 70–99)
Potassium: 3.4 mEq/L — ABNORMAL LOW (ref 3.5–5.1)
Sodium: 140 mEq/L (ref 135–145)
Total Bilirubin: 0.3 mg/dL (ref 0.3–1.2)
Total Protein: 6.9 g/dL (ref 6.0–8.3)

## 2012-11-08 LAB — LIPASE, BLOOD: Lipase: 20 U/L (ref 11–59)

## 2012-11-08 MED ORDER — HYDROMORPHONE HCL PF 1 MG/ML IJ SOLN
1.0000 mg | Freq: Once | INTRAMUSCULAR | Status: AC
  Administered 2012-11-08: 1 mg via INTRAVENOUS
  Filled 2012-11-08: qty 1

## 2012-11-08 MED ORDER — ONDANSETRON HCL 4 MG/2ML IJ SOLN
4.0000 mg | Freq: Once | INTRAMUSCULAR | Status: AC
  Administered 2012-11-08: 4 mg via INTRAVENOUS
  Filled 2012-11-08: qty 2

## 2012-11-08 MED ORDER — PROMETHAZINE HCL 25 MG PO TABS: 25.0000 mg | ORAL_TABLET | Freq: Four times a day (QID) | ORAL | Status: DC | PRN

## 2012-11-08 MED ORDER — ONDANSETRON HCL 4 MG/2ML IJ SOLN
4.0000 mg | Freq: Once | INTRAMUSCULAR | Status: AC
  Administered 2012-11-08: 4 mg via INTRAVENOUS

## 2012-11-08 MED ORDER — IOHEXOL 300 MG/ML  SOLN
100.0000 mL | Freq: Once | INTRAMUSCULAR | Status: AC | PRN
Start: 2012-11-08 — End: 2012-11-08
  Administered 2012-11-08: 100 mL via INTRAVENOUS

## 2012-11-08 MED ORDER — ONDANSETRON HCL 4 MG/2ML IJ SOLN
INTRAMUSCULAR | Status: AC
  Filled 2012-11-08: qty 2

## 2012-11-08 MED ORDER — SODIUM CHLORIDE 0.9 % IV BOLUS (SEPSIS)
1000.0000 mL | Freq: Once | INTRAVENOUS | Status: AC
  Administered 2012-11-08: 1000 mL via INTRAVENOUS

## 2012-11-08 MED ORDER — OXYCODONE-ACETAMINOPHEN 5-325 MG PO TABS: 1.0000 | ORAL_TABLET | Freq: Four times a day (QID) | ORAL | Status: DC | PRN

## 2012-11-08 MED ORDER — IOHEXOL 300 MG/ML  SOLN
50.0000 mL | Freq: Once | INTRAMUSCULAR | Status: AC | PRN
  Administered 2012-11-08: 50 mL via ORAL

## 2012-11-08 NOTE — ED Provider Notes (Signed)
History    CSN: [id number] Arrival date & time 11/08/12  0559  First MD Initiated Contact with Patient 11/08/12 [phone]     Chief Complaint  Patient presents with  . Abdominal Pain  . Nausea  . Dizziness   (Consider location/radiation/quality/duration/timing/severity/associated sxs/prior Treatment) HPI Patient presents to the emergency department with abdominal pain, with nausea and vomiting, that began yesterday.  Patient, states, that she has a history of ulcerative colitis.  Patient sees GI for this issue.  The patient, states, that she's not had any chest pain, shortness of breath, fever, back pain, dysuria, weakness, numbness, dizziness, headache, blurred vision, or syncope.  Patient, states she did not take any medications prior to arrival.  Patient, states nothing seems to make her condition, better or worse.  Patient, states, that she is not taking her ulcerative colitis medications because she felt she was doing better Past Medical History  Diagnosis Date  . Ulcerative colitis    Past Surgical History  Procedure Laterality Date  . Abdominal surgery    . Colon surgery     Family History  Problem Relation Age of Onset  . Hypertension Mother   . Stroke Mother   . Multiple sclerosis Sister    History  Substance Use Topics  . Smoking status: Never Smoker   . Smokeless tobacco: Never Used  . Alcohol Use: No   OB History   Grav Para Term Preterm Abortions TAB SAB Ect Mult Living                 Review of Systems All other systems negative except as documented in the HPI. All pertinent positives and negatives as reviewed in the HPI. Allergies  Flagyl and Metoclopramide hcl  Home Medications   Current Outpatient Rx  Name  Route  Sig  Dispense  Refill  . Aspirin-Salicylamide-Caffeine (BC HEADACHE POWDER PO)   Oral   Take 1 Package by mouth daily as needed. headache         . ondansetron (ZOFRAN) 4 MG tablet   Oral   Take 1 tablet (4 mg total) by mouth every 6  (six) hours as needed for nausea.   20 tablet   0    BP 128/76  Pulse 67  Temp(Src) 98 F (36.7 C)  Resp 18  Ht 5\' 6"  (1.676 m)  Wt 198 lb (89.812 kg)  BMI 31.97 kg/m2  SpO2 99%  LMP 02/20/2012 Physical Exam  Nursing note and vitals reviewed. Constitutional: She is oriented to person, place, and time. She appears well-developed and well-nourished. No distress.  HENT:  Head: Normocephalic and atraumatic.  Mouth/Throat: Oropharynx is clear and moist.  Eyes: Pupils are equal, round, and reactive to light.  Neck: Normal range of motion. Neck supple.  Cardiovascular: Normal rate, regular rhythm and normal heart sounds.  Exam reveals no gallop and no friction rub.   No murmur heard. Pulmonary/Chest: Effort normal and breath sounds normal. No respiratory distress.  Abdominal: Soft. Normal appearance and bowel sounds are normal. She exhibits no distension. There is tenderness. There is no rigidity, no rebound, no guarding and no CVA tenderness. No hernia.  Neurological: She is alert and oriented to person, place, and time.  Skin: Skin is warm and dry. No rash noted.    ED Course  Procedures (including critical care time) Labs Reviewed  COMPREHENSIVE METABOLIC PANEL - Abnormal; Notable for the following:    Potassium 3.4 (*)    Glucose, Bld 104 (*)  All other components within normal limits  CBC WITH DIFFERENTIAL  LIPASE, BLOOD  URINALYSIS, ROUTINE W REFLEX MICROSCOPIC   Ct Abdomen Pelvis W Contrast  11/08/2012   *RADIOLOGY REPORT*  Clinical Data: Abdominal pain. History of ulcerative colitis.  CT ABDOMEN AND PELVIS WITH CONTRAST  Technique:  Multidetector CT imaging of the abdomen and pelvis was performed following the standard protocol during bolus administration of intravenous contrast.  Contrast: OMNIPAQUE IOHEXOL 300 MG/ML  SOLN  Comparison: 02/20/2012  Findings: There is bibasilar atelectasis at the lung bases. There may be mild wall thickening near the right GE  junction on image 18. No evidence for free intraperitoneal air.  Normal appearance of the liver, gallbladder and portal venous system.  Normal appearance of the pancreas, spleen, adrenal glands and both kidneys.  There is a 3.2 cm low density structure in the region of the left adnexa which could represent an ovarian cyst.  There is also is 1.5 cm low density structure posterior to the uterus on image 79. This low density structure posterior to the lower uterine segment was present on the previous exam and minimally changed.  There is evidence for fluid in endometrial cavity.  Normal appearance of the appendix. There is no significant colonic inflammation.  There is diffuse bone sclerosis in the pelvis on both sides of the sacroiliac joints.  Irregularity of the posterior sacroiliac joints. Findings are compatible with chronic sacroiliitis.  IMPRESSION: There is mild wall thickening near the GE junction and there may be a small hiatal hernia.  The findings have not significantly changed since 2013.  Stable 1.5 cm low density collection posterior to the lower uterine segment.  This may represent a stable peritoneal inclusion cyst or fluid collection from prior inflammation.  There is a new 3.2 cm low density structure in the region of the left adnexa.  This could represent an ovarian cyst and could be followed up with an ultrasound in 6-12 weeks.  Diffuse irregularity of the sacroiliac joints is consistent with sacroiliitis.   Original Report Authenticated By: Richarda Overlie, M.D.   Patient's CT scan does not show any significant acute findings.  There is an acute change in the left adnexal region.  Patient does not have any significant pelvic pain.  Patient is advised to return here as needed patient has not had any vomiting here in the emergency department.  Patient has been nauseated, but this was relieved with Zofran.  Patient has received IV fluids. MDM  MDM Reviewed: vitals, nursing note and previous  chart Interpretation: labs and CT scan      Carlyle Dolly, PA-C 11/08/12 1041

## 2012-11-08 NOTE — ED Provider Notes (Signed)
Medical screening examination/treatment/procedure(s) were performed by non-physician practitioner and as supervising physician I was immediately available for consultation/collaboration.   Blanchie Dessert, MD 11/08/12 1535

## 2012-11-08 NOTE — ED Notes (Addendum)
CT aware pt finished drinking contrast 

## 2012-11-08 NOTE — ED Notes (Signed)
0600  Pt arrives to the ER with Abd pain with nausea and dizziness that started last night around 1900.  Pt states she has blood in both her stool and emesis.  Emesis x2 that is brown coffee ground texture.  Diarrhea multiple times as well.

## 2012-11-08 NOTE — ED Notes (Signed)
Patient transported to CT 

## 2013-05-13 ENCOUNTER — Encounter (HOSPITAL_COMMUNITY): Payer: Self-pay | Admitting: Emergency Medicine

## 2013-05-13 ENCOUNTER — Emergency Department (HOSPITAL_COMMUNITY): Payer: 59

## 2013-05-13 ENCOUNTER — Emergency Department (HOSPITAL_COMMUNITY)
Admission: EM | Admit: 2013-05-13 | Discharge: 2013-05-13 | Disposition: A | Discharge status: Home / Self Care | Payer: 59 | Attending: Emergency Medicine | Admitting: Emergency Medicine

## 2013-05-13 DIAGNOSIS — N939 Abnormal uterine and vaginal bleeding, unspecified: Secondary | ICD-10-CM

## 2013-05-13 DIAGNOSIS — K921 Melena: Secondary | ICD-10-CM | POA: Insufficient documentation

## 2013-05-13 DIAGNOSIS — R197 Diarrhea, unspecified: Secondary | ICD-10-CM | POA: Insufficient documentation

## 2013-05-13 DIAGNOSIS — K5900 Constipation, unspecified: Secondary | ICD-10-CM

## 2013-05-13 DIAGNOSIS — N938 Other specified abnormal uterine and vaginal bleeding: Secondary | ICD-10-CM | POA: Insufficient documentation

## 2013-05-13 DIAGNOSIS — Z3202 Encounter for pregnancy test, result negative: Secondary | ICD-10-CM | POA: Insufficient documentation

## 2013-05-13 DIAGNOSIS — N949 Unspecified condition associated with female genital organs and menstrual cycle: Secondary | ICD-10-CM | POA: Insufficient documentation

## 2013-05-13 DIAGNOSIS — R630 Anorexia: Secondary | ICD-10-CM | POA: Insufficient documentation

## 2013-05-13 DIAGNOSIS — IMO0002 Reserved for concepts with insufficient information to code with codable children: Secondary | ICD-10-CM

## 2013-05-13 DIAGNOSIS — N9489 Other specified conditions associated with female genital organs and menstrual cycle: Secondary | ICD-10-CM | POA: Insufficient documentation

## 2013-05-13 DIAGNOSIS — N925 Other specified irregular menstruation: Secondary | ICD-10-CM | POA: Insufficient documentation

## 2013-05-13 DIAGNOSIS — R1013 Epigastric pain: Secondary | ICD-10-CM | POA: Insufficient documentation

## 2013-05-13 DIAGNOSIS — R112 Nausea with vomiting, unspecified: Secondary | ICD-10-CM | POA: Insufficient documentation

## 2013-05-13 DIAGNOSIS — R1030 Lower abdominal pain, unspecified: Secondary | ICD-10-CM

## 2013-05-13 DIAGNOSIS — N8500 Endometrial hyperplasia, unspecified: Secondary | ICD-10-CM | POA: Insufficient documentation

## 2013-05-13 LAB — COMPREHENSIVE METABOLIC PANEL
ALT: 15 U/L (ref 0–35)
AST: 38 U/L — ABNORMAL HIGH (ref 0–37)
Albumin: 3.8 g/dL (ref 3.5–5.2)
Alkaline Phosphatase: 76 U/L (ref 39–117)
BILIRUBIN TOTAL: 0.3 mg/dL (ref 0.3–1.2)
BUN: 15 mg/dL (ref 6–23)
CHLORIDE: 104 meq/L (ref 96–112)
CO2: 23 meq/L (ref 19–32)
CREATININE: 0.64 mg/dL (ref 0.50–1.10)
Calcium: 9.5 mg/dL (ref 8.4–10.5)
GFR calc Af Amer: >90 mL/min (ref >90)
Glucose, Bld: 106 mg/dL — ABNORMAL HIGH (ref 70–99)
Potassium: 5.9 mEq/L — ABNORMAL HIGH (ref 3.7–5.3)
Sodium: 140 mEq/L (ref 137–147)
Total Protein: 7.8 g/dL (ref 6.0–8.3)

## 2013-05-13 LAB — URINALYSIS, ROUTINE W REFLEX MICROSCOPIC
Bilirubin Urine: NEGATIVE
GLUCOSE, UA: NEGATIVE mg/dL
KETONES UR: NEGATIVE mg/dL
LEUKOCYTES UA: NEGATIVE
NITRITE: NEGATIVE
PROTEIN: NEGATIVE mg/dL
Specific Gravity, Urine: 1.005 (ref 1.005–1.030)
Urobilinogen, UA: 0.2 mg/dL (ref 0.0–1.0)
pH: 7.5 (ref 5.0–8.0)

## 2013-05-13 LAB — CBC WITH DIFFERENTIAL/PLATELET
BASOS PCT: 0 % (ref 0–1)
Basophils Absolute: 0 10*3/uL (ref 0.0–0.1)
EOS ABS: 0.1 10*3/uL (ref 0.0–0.7)
EOS PCT: 2 % (ref 0–5)
HEMATOCRIT: 39.7 % (ref 36.0–46.0)
HEMOGLOBIN: 13.6 g/dL (ref 12.0–15.0)
Lymphocytes Relative: 22 % (ref 12–46)
Lymphs Abs: 1.4 10*3/uL (ref 0.7–4.0)
MCH: 33.3 pg (ref 26.0–34.0)
MCHC: 34.3 g/dL (ref 30.0–36.0)
MCV: 97.1 fL (ref 78.0–100.0)
MONO ABS: 0.4 10*3/uL (ref 0.1–1.0)
MONOS PCT: 7 % (ref 3–12)
NEUTROS PCT: 70 % (ref 43–77)
Neutro Abs: 4.3 10*3/uL (ref 1.7–7.7)
Platelets: 282 10*3/uL (ref 150–400)
RBC: 4.09 MIL/uL (ref 3.87–5.11)
RDW: 13.1 % (ref 11.5–15.5)
WBC: 6.2 10*3/uL (ref 4.0–10.5)

## 2013-05-13 LAB — URINE MICROSCOPIC-ADD ON

## 2013-05-13 LAB — POTASSIUM: Potassium: 4 mEq/L (ref 3.7–5.3)

## 2013-05-13 LAB — LIPASE, BLOOD: LIPASE: 37 U/L (ref 11–59)

## 2013-05-13 LAB — CG4 I-STAT (LACTIC ACID): LACTIC ACID, VENOUS: 0.81 mmol/L (ref 0.5–2.2)

## 2013-05-13 LAB — PREGNANCY, URINE: Preg Test, Ur: NEGATIVE

## 2013-05-13 LAB — WET PREP, GENITAL
Trich, Wet Prep: NONE SEEN
WBC WET PREP: NONE SEEN
YEAST WET PREP: NONE SEEN

## 2013-05-13 MED ORDER — SODIUM CHLORIDE 0.9 % IV BOLUS (SEPSIS): 1000.0000 mL | Freq: Once | INTRAVENOUS | Status: DC

## 2013-05-13 MED ORDER — HYDROMORPHONE HCL PF 1 MG/ML IJ SOLN
1.0000 mg | Freq: Once | INTRAMUSCULAR | Status: DC
  Filled 2013-05-13: qty 1

## 2013-05-13 MED ORDER — ONDANSETRON HCL 4 MG/2ML IJ SOLN
4.0000 mg | Freq: Once | INTRAMUSCULAR | Status: DC
  Filled 2013-05-13: qty 2

## 2013-05-13 MED ORDER — DOCUSATE SODIUM 50 MG PO CAPS: 50.0000 mg | ORAL_CAPSULE | Freq: Two times a day (BID) | ORAL | Status: DC

## 2013-05-13 MED ORDER — ONDANSETRON HCL 4 MG PO TABS: 4.0000 mg | ORAL_TABLET | Freq: Four times a day (QID) | ORAL | Status: DC

## 2013-05-13 MED ORDER — HYDROMORPHONE HCL PF 1 MG/ML IJ SOLN
1.0000 mg | Freq: Once | INTRAMUSCULAR | Status: AC
  Administered 2013-05-13: 1 mg via INTRAMUSCULAR

## 2013-05-13 MED ORDER — OXYCODONE-ACETAMINOPHEN 5-325 MG PO TABS
2.0000 | ORAL_TABLET | Freq: Once | ORAL | Status: AC
  Administered 2013-05-13: 2 via ORAL
  Filled 2013-05-13: qty 2

## 2013-05-13 MED ORDER — HYDROCODONE-ACETAMINOPHEN 5-325 MG PO TABS: 1.0000 | ORAL_TABLET | Freq: Four times a day (QID) | ORAL | Status: DC | PRN

## 2013-05-13 MED ORDER — IOHEXOL 300 MG/ML  SOLN
100.0000 mL | Freq: Once | INTRAMUSCULAR | Status: AC | PRN
  Administered 2013-05-13: 100 mL via INTRAVENOUS

## 2013-05-13 MED ORDER — IOHEXOL 300 MG/ML  SOLN: 25.0000 mL | INTRAMUSCULAR | Status: AC

## 2013-05-13 MED ORDER — ONDANSETRON 4 MG PO TBDP
8.0000 mg | ORAL_TABLET | Freq: Once | ORAL | Status: AC
  Administered 2013-05-13: 8 mg via ORAL
  Filled 2013-05-13: qty 2

## 2013-05-13 NOTE — ED Notes (Signed)
Patient transported to Ultrasound 

## 2013-05-13 NOTE — Discharge Instructions (Signed)
Abdominal Pain, Women Abdominal (stomach, pelvic, or belly) pain can be caused by many things. It is important to tell your doctor:  The location of the pain.  Does it come and go or is it present all the time?  Are there things that start the pain (eating certain foods, exercise)?  Are there other symptoms associated with the pain (fever, nausea, vomiting, diarrhea)? All of this is helpful to know when trying to find the cause of the pain. CAUSES   Stomach: virus or bacteria infection, or ulcer.  Intestine: appendicitis (inflamed appendix), regional ileitis (Crohn's disease), ulcerative colitis (inflamed colon), irritable bowel syndrome, diverticulitis (inflamed diverticulum of the colon), or cancer of the stomach or intestine.  Gallbladder disease or stones in the gallbladder.  Kidney disease, kidney stones, or infection.  Pancreas infection or cancer.  Fibromyalgia (pain disorder).  Diseases of the female organs:  Uterus: fibroid (non-cancerous) tumors or infection.  Fallopian tubes: infection or tubal pregnancy.  Ovary: cysts or tumors.  Pelvic adhesions (scar tissue).  Endometriosis (uterus lining tissue growing in the pelvis and on the pelvic organs).  Pelvic congestion syndrome (female organs filling up with blood just before the menstrual period).  Pain with the menstrual period.  Pain with ovulation (producing an egg).  Pain with an IUD (intrauterine device, birth control) in the uterus.  Cancer of the female organs.  Functional pain (pain not caused by a disease, may improve without treatment).  Psychological pain.  Depression. DIAGNOSIS  Your doctor will decide the seriousness of your pain by doing an examination.  Blood tests.  X-rays.  Ultrasound.  CT scan (computed tomography, special type of X-ray).  MRI (magnetic resonance imaging).  Cultures, for infection.  Barium enema (dye inserted in the large intestine, to better view it with  X-rays).  Colonoscopy (looking in intestine with a lighted tube).  Laparoscopy (minor surgery, looking in abdomen with a lighted tube).  Major abdominal exploratory surgery (looking in abdomen with a large incision). TREATMENT  The treatment will depend on the cause of the pain.   Many cases can be observed and treated at home.  Over-the-counter medicines recommended by your caregiver.  Prescription medicine.  Antibiotics, for infection.  Birth control pills, for painful periods or for ovulation pain.  Hormone treatment, for endometriosis.  Nerve blocking injections.  Physical therapy.  Antidepressants.  Counseling with a psychologist or psychiatrist.  Minor or major surgery. HOME CARE INSTRUCTIONS   Do not take laxatives, unless directed by your caregiver.  Take over-the-counter pain medicine only if ordered by your caregiver. Do not take aspirin because it can cause an upset stomach or bleeding.  Try a clear liquid diet (broth or water) as ordered by your caregiver. Slowly move to a bland diet, as tolerated, if the pain is related to the stomach or intestine.  Have a thermometer and take your temperature several times a day, and record it.  Bed rest and sleep, if it helps the pain.  Avoid sexual intercourse, if it causes pain.  Avoid stressful situations.  Keep your follow-up appointments and tests, as your caregiver orders.  If the pain does not go away with medicine or surgery, you may try:  Acupuncture.  Relaxation exercises (yoga, meditation).  Group therapy.  Counseling. SEEK MEDICAL CARE IF:   You notice certain foods cause stomach pain.  Your home care treatment is not helping your pain.  You need stronger pain medicine.  You want your IUD removed.  You feel faint or  lightheaded.  You develop nausea and vomiting.  You develop a rash.  You are having side effects or an allergy to your medicine. SEEK IMMEDIATE MEDICAL CARE IF:   Your  pain does not go away or gets worse.  You have a fever.  Your pain is felt only in portions of the abdomen. The right side could possibly be appendicitis. The left lower portion of the abdomen could be colitis or diverticulitis.  You are passing blood in your stools (bright red or black tarry stools, with or without vomiting).  You have blood in your urine.  You develop chills, with or without a fever.  You pass out. MAKE SURE YOU:   Understand these instructions.  Will watch your condition.  Will get help right away if you are not doing well or get worse. Document Released: 02/17/2007 Document Revised: 07/15/2011 Document Reviewed: 03/09/2009 [facility] Patient Information 2014 [place], [place].  Abnormal Uterine Bleeding Abnormal uterine bleeding can affect women at various stages in life, including teenagers, women in their reproductive years, pregnant women, and women who have reached menopause. Several kinds of uterine bleeding are considered abnormal, including:  Bleeding or spotting between periods.   Bleeding after sexual intercourse.   Bleeding that is heavier or more than normal.   Periods that last longer than usual.  Bleeding after menopause.  Many cases of abnormal uterine bleeding are minor and simple to treat, while others are more serious. Any type of abnormal bleeding should be evaluated by your health care provider. Treatment will depend on the cause of the bleeding. HOME CARE INSTRUCTIONS Monitor your condition for any changes. The following actions may help to alleviate any discomfort you are experiencing:  Avoid the use of tampons and douches as directed by your health care provider.  Change your pads frequently. You should get regular pelvic exams and Pap tests. Keep all follow-up appointments for diagnostic tests as directed by your health care provider.  SEEK MEDICAL CARE IF:   Your bleeding lasts more than 1 week.   You feel dizzy at times.   SEEK IMMEDIATE MEDICAL CARE IF:   You pass out.   You are changing pads every 15 to 30 minutes.   You have abdominal pain.  You have a fever.   You become sweaty or weak.   You are passing large blood clots from the vagina.   You start to feel nauseous and vomit. MAKE SURE YOU:   Understand these instructions.  Will watch your condition.  Will get help right away if you are not doing well or get worse. Document Released: 04/22/2005 Document Revised: 12/23/2012 Document Reviewed: 11/19/2012 [facility] Patient Information 2014 [place], [place]. Endometrial Biopsy Endometrial biopsy is a procedure in which a tissue sample is taken from inside the uterus. The tissue sample is then looked at under a microscope to see if the tissue is normal or abnormal. The endometrium is the lining of the uterus. This procedure helps determine where you are in your menstrual cycle and how hormone levels are affecting the lining of the uterus. This procedure may also be used to evaluate uterine bleeding or to diagnose endometrial cancer, tuberculosis, polyps, or inflammatory conditions.  LET [facility] CARE PROVIDER KNOW ABOUT:  Any allergies you have.  All medicines you are taking, including vitamins, herbs, eye drops, creams, and over-the-counter medicines.  Previous problems you or members of your family have had with the use of anesthetics.  Any blood disorders you have.  Previous surgeries you  have had.  Medical conditions you have.  Possibility of pregnancy. RISKS AND COMPLICATIONS Generally, this is a safe procedure. However, as with any procedure, complications can occur. Possible complications include:  Bleeding.  Pelvic infection.  Puncture of the uterine wall with the biopsy device (rare). BEFORE THE PROCEDURE   Keep a record of your menstrual cycles as directed by your health care provider. You may need to schedule your procedure for a specific time in your  cycle.  You may want to bring a sanitary pad to wear home after the procedure.  Arrange for someone to drive you home after the procedure if you will be given a medicine to help you relax (sedative). PROCEDURE   You may be given a sedative to relax you.  You will lie on an exam table with your feet and legs supported as in a pelvic exam.  Your health care provider will insert an instrument (speculum) into your vagina to see your cervix.  Your cervix will be cleansed with an antiseptic solution. A medicine (local anesthetic) will be used to numb the cervix.  A forceps instrument (tenaculum) will be used to hold your cervix steady for the biopsy.  A thin, rodlike instrument (uterine sound) will be inserted through your cervix to determine the length of your uterus and the location where the biopsy sample will be removed.  A thin, flexible tube (catheter) will be inserted through your cervix and into the uterus. The catheter is used to collect the biopsy sample from your endometrial tissue.  The catheter and speculum will then be removed, and the tissue sample will be sent to a lab for examination. AFTER THE PROCEDURE  You will rest in a recovery area until you are ready to go home.  You may have mild cramping and a small amount of vaginal bleeding for a few days after the procedure. This is normal.  Make sure you find out how to get your test results. Document Released: 08/23/2004 Document Revised: 12/23/2012 Document Reviewed: 10/07/2012 [facility] Patient Information 2014 [place], [place].

## 2013-05-13 NOTE — ED Notes (Signed)
Patient with abdominal pain and pelvic pain that started last night, with some vomiting.  No diarrhea.  Patient states that the pain was getting worse during the night.

## 2013-05-13 NOTE — ED Provider Notes (Signed)
Care transferred from Dr. Wyvonnia Dusky at [place] on 53 y.o. female Who presented w/ low abdominal and pelvic pain starting last night with n/v, and vaginal bleeding. CT ab pelvis shows constipation, ST joint arthritis.  Both CT & TVUS show abnml endometrial thickening and irregularity which pt will f/u with GYN as outpt for possible biopsy.  Additionally, she has a stable complex cyst posterior to the cervix since July '14 which she can also f/u with outpt GYN for. Will transition to oral pain meds. Pt had appeared comfortable in ED during my shift and has had no more emesis.  1. Lower abdominal pain   2. Endometrial hyperplasia, unspecified   3. Abnormal uterine bleeding   4. Constipation   5. Pelvic cyst     Neta Ehlers, MD 05/14/13 1101

## 2013-05-13 NOTE — ED Provider Notes (Signed)
CSN: [id number]     Arrival date & time 05/13/13  0456 History   First MD Initiated Contact with Patient 05/13/13 [phone]     Chief Complaint  Patient presents with  . Abdominal Pain   (Consider location/radiation/quality/duration/timing/severity/associated sxs/prior Treatment) HPI Comments: Patient with history of ulcerative colitis, not on medications currently presenting with two-day history of abdominal pain. She is epigastric as well as suprapubic abdominal pain that is typical of her ulcerative colitis exacerbations. She denies any fever. She states she has bloody stools at baseline and has noticed redness with wiping. She is also having vaginal bleeding for the past 2 days has been having irregular periods since September. She denies any chest pain or shortness of breath. The pain is somewhat improved with taking BC powders. Nothing makes it worse.  The history is provided by the patient.    Past Medical History  Diagnosis Date  . Ulcerative colitis    Past Surgical History  Procedure Laterality Date  . Abdominal surgery    . Colon surgery     Family History  Problem Relation Age of Onset  . Hypertension Mother   . Stroke Mother   . Multiple sclerosis Sister    History  Substance Use Topics  . Smoking status: Never Smoker   . Smokeless tobacco: Never Used  . Alcohol Use: No   OB History   Grav Para Term Preterm Abortions TAB SAB Ect Mult Living                 Review of Systems  Constitutional: Positive for activity change and appetite change. Negative for fever.  HENT: Negative for congestion and rhinorrhea.   Respiratory: Negative for cough, chest tightness and shortness of breath.   Cardiovascular: Negative for chest pain.  Gastrointestinal: Positive for nausea, vomiting, abdominal pain, diarrhea and blood in stool.  Genitourinary: Negative for dysuria, hematuria, vaginal bleeding and vaginal discharge.  Musculoskeletal: Negative for back pain.  Skin: Negative for  rash.  Neurological: Negative for dizziness, weakness and headaches.  A complete 10 system review of systems was obtained and all systems are negative except as noted in the HPI and PMH.    Allergies  Flagyl and Metoclopramide hcl  Home Medications   Current Outpatient Rx  Name  Route  Sig  Dispense  Refill  . Aspirin-Salicylamide-Caffeine (BC HEADACHE POWDER PO)   Oral   Take 1 Package by mouth daily as needed. headache          BP 109/54  Pulse 55  Temp(Src) 98 F (36.7 C) (Oral)  Resp 16  SpO2 100%  LMP 05/12/2013 Physical Exam  Constitutional: She is oriented to person, place, and time. She appears well-developed and well-nourished. No distress.  HENT:  Head: Normocephalic and atraumatic.  Mouth/Throat: Oropharynx is clear and moist. No oropharyngeal exudate.  Eyes: Conjunctivae and EOM are normal. Pupils are equal, round, and reactive to light.  Neck: Normal range of motion. Neck supple.  Cardiovascular: Normal rate, regular rhythm and normal heart sounds.   No murmur heard. Pulmonary/Chest: Effort normal and breath sounds normal. No respiratory distress.  Abdominal: Soft. There is tenderness. There is no rebound.  Epigastric and suprapubic tenderness No peritoneal signs. No guarding or rebound.  Genitourinary:  Normal external genitalia. Dark blood in the vaginal vault with oozing from closed cervix. No CMT. No lateralizing adnexal pain. Patient refuses rectal exam  Musculoskeletal: Normal range of motion. She exhibits no edema and no tenderness.  Neurological: She is  alert and oriented to person, place, and time. No cranial nerve deficit. She exhibits normal muscle tone. Coordination normal.  Skin: Skin is warm.    ED Course  Procedures (including critical care time) Labs Review Labs Reviewed  WET PREP, GENITAL - Abnormal; Notable for the following:    Clue Cells Wet Prep HPF POC FEW (*)    All other components within normal limits  COMPREHENSIVE  METABOLIC PANEL - Abnormal; Notable for the following:    Potassium 5.9 (*)    Glucose, Bld 106 (*)    AST 38 (*)    All other components within normal limits  URINALYSIS, ROUTINE W REFLEX MICROSCOPIC - Abnormal; Notable for the following:    APPearance CLOUDY (*)    Hgb urine dipstick LARGE (*)    All other components within normal limits  GC/CHLAMYDIA PROBE AMP  LIPASE, BLOOD  PREGNANCY, URINE  POTASSIUM  URINE MICROSCOPIC-ADD ON  CBC WITH DIFFERENTIAL  CBC WITH DIFFERENTIAL  CG4 I-STAT (LACTIC ACID)   Imaging Review No results found.  EKG Interpretation   None       MDM  No diagnosis found. History of ulcerative colitis with abdominal pain and pelvic pain that onset last night with some vomiting. No chest pain or shortness of breath. Abdomen soft without peritoneal signs.  K 5.8 but hemolyzed.  CBC pending as clotted specimen. CT a/p and pelvic US ordered to evaluate abdominal and pelvic pain with vaginal bleeding. Care signed out to Dr. Tawnya Crook.  Will likely need GYN evaluation for postmenopausal bleeding.  Vitals stable  BP 111/57  Pulse 66  Temp(Src) 98 F (36.7 C) (Oral)  Resp 18  SpO2 100%  LMP 05/12/2013    Ezequiel Essex, MD 05/13/13 1557

## 2013-05-13 NOTE — ED Notes (Signed)
Report received, assumed care.  

## 2013-05-14 LAB — GC/CHLAMYDIA PROBE AMP
CT Probe RNA: NEGATIVE
GC Probe RNA: NEGATIVE

## 2013-08-31 ENCOUNTER — Other Ambulatory Visit: Payer: Self-pay | Admitting: Obstetrics and Gynecology

## 2014-03-13 ENCOUNTER — Emergency Department (HOSPITAL_COMMUNITY)
Admission: EM | Admit: 2014-03-13 | Discharge: 2014-03-13 | Disposition: A | Discharge status: Home / Self Care | Payer: 59 | Attending: Emergency Medicine | Admitting: Emergency Medicine

## 2014-03-13 ENCOUNTER — Encounter (HOSPITAL_COMMUNITY): Payer: Self-pay | Admitting: Nurse Practitioner

## 2014-03-13 ENCOUNTER — Emergency Department (HOSPITAL_COMMUNITY): Payer: 59

## 2014-03-13 DIAGNOSIS — Z7982 Long term (current) use of aspirin: Secondary | ICD-10-CM | POA: Diagnosis not present

## 2014-03-13 DIAGNOSIS — Z79899 Other long term (current) drug therapy: Secondary | ICD-10-CM | POA: Diagnosis not present

## 2014-03-13 DIAGNOSIS — R079 Chest pain, unspecified: Secondary | ICD-10-CM | POA: Diagnosis present

## 2014-03-13 DIAGNOSIS — R0789 Other chest pain: Secondary | ICD-10-CM | POA: Diagnosis not present

## 2014-03-13 DIAGNOSIS — Z933 Colostomy status: Secondary | ICD-10-CM | POA: Diagnosis not present

## 2014-03-13 DIAGNOSIS — Z8719 Personal history of other diseases of the digestive system: Secondary | ICD-10-CM | POA: Diagnosis not present

## 2014-03-13 DIAGNOSIS — R0602 Shortness of breath: Secondary | ICD-10-CM | POA: Diagnosis not present

## 2014-03-13 LAB — CBC
HCT: 40.1 % (ref 36.0–46.0)
HEMOGLOBIN: 13.5 g/dL (ref 12.0–15.0)
MCH: 32.5 pg (ref 26.0–34.0)
MCHC: 33.7 g/dL (ref 30.0–36.0)
MCV: 96.6 fL (ref 78.0–100.0)
Platelets: 282 10*3/uL (ref 150–400)
RBC: 4.15 MIL/uL (ref 3.87–5.11)
RDW: 12.9 % (ref 11.5–15.5)
WBC: 6.8 10*3/uL (ref 4.0–10.5)

## 2014-03-13 LAB — BASIC METABOLIC PANEL
Anion gap: 14 (ref 5–15)
BUN: 14 mg/dL (ref 6–23)
CALCIUM: 9.9 mg/dL (ref 8.4–10.5)
CO2: 25 mEq/L (ref 19–32)
CREATININE: 0.61 mg/dL (ref 0.50–1.10)
Chloride: 104 mEq/L (ref 96–112)
GFR calc Af Amer: >90 mL/min (ref >90)
GFR calc non Af Amer: >90 mL/min (ref >90)
GLUCOSE: 93 mg/dL (ref 70–99)
Potassium: 4.1 mEq/L (ref 3.7–5.3)
Sodium: 143 mEq/L (ref 137–147)

## 2014-03-13 LAB — TROPONIN I

## 2014-03-13 MED ORDER — HYDROCODONE-ACETAMINOPHEN 5-325 MG PO TABS
1.0000 | ORAL_TABLET | ORAL | Status: DC | PRN
Start: 2014-03-13 — End: 2015-07-03

## 2014-03-13 MED ORDER — KETOROLAC TROMETHAMINE 30 MG/ML IJ SOLN
30.0000 mg | Freq: Once | INTRAMUSCULAR | Status: AC
  Administered 2014-03-13: 30 mg via INTRAVENOUS
  Filled 2014-03-13: qty 1

## 2014-03-13 MED ORDER — KETOROLAC TROMETHAMINE 60 MG/2ML IM SOLN
60.0000 mg | Freq: Once | INTRAMUSCULAR | Status: DC
  Filled 2014-03-13: qty 2

## 2014-03-13 NOTE — Discharge Instructions (Signed)
Take Vicodin for severe pain only. No driving or operating heavy machinery while taking vicodin. This medication may cause drowsiness. Follow-up with the wellness clinic for one of the resources below to establish care with a primary care physician.  Chest Pain (Nonspecific) It is often hard to give a specific diagnosis for the cause of chest pain. There is always a chance that your pain could be related to something serious, such as a heart attack or a blood clot in the lungs. You need to follow up with your health care provider for further evaluation. CAUSES   Heartburn.  Pneumonia or bronchitis.  Anxiety or stress.  Inflammation around your heart (pericarditis) or lung (pleuritis or pleurisy).  A blood clot in the lung.  A collapsed lung (pneumothorax). It can develop suddenly on its own (spontaneous pneumothorax) or from trauma to the chest.  Shingles infection (herpes zoster virus). The chest wall is composed of bones, muscles, and cartilage. Any of these can be the source of the pain.  The bones can be bruised by injury.  The muscles or cartilage can be strained by coughing or overwork.  The cartilage can be affected by inflammation and become sore (costochondritis). DIAGNOSIS  Lab tests or other studies may be needed to find the cause of your pain. Your health care provider may have you take a test called an ambulatory electrocardiogram (ECG). An ECG records your heartbeat patterns over a 24-hour period. You may also have other tests, such as:  Transthoracic echocardiogram (TTE). During echocardiography, sound waves are used to evaluate how blood flows through your heart.  Transesophageal echocardiogram (TEE).  Cardiac monitoring. This allows your health care provider to monitor your heart rate and rhythm in real time.  Holter monitor. This is a portable device that records your heartbeat and can help diagnose heart arrhythmias. It allows your health care provider to track  your heart activity for several days, if needed.  Stress tests by exercise or by giving medicine that makes the heart beat faster. TREATMENT   Treatment depends on what may be causing your chest pain. Treatment may include:  Acid blockers for heartburn.  Anti-inflammatory medicine.  Pain medicine for inflammatory conditions.  Antibiotics if an infection is present.  You may be advised to change lifestyle habits. This includes stopping smoking and avoiding alcohol, caffeine, and chocolate.  You may be advised to keep your head raised (elevated) when sleeping. This reduces the chance of acid going backward from your stomach into your esophagus. Most of the time, nonspecific chest pain will improve within 2-3 days with rest and mild pain medicine.  HOME CARE INSTRUCTIONS   If antibiotics were prescribed, take them as directed. Finish them even if you start to feel better.  For the next few days, avoid physical activities that bring on chest pain. Continue physical activities as directed.  Do not use any tobacco products, including cigarettes, chewing tobacco, or electronic cigarettes.  Avoid drinking alcohol.  Only take medicine as directed by your health care provider.  Follow your health care provider's suggestions for further testing if your chest pain does not go away.  Keep any follow-up appointments you made. If you do not go to an appointment, you could develop lasting (chronic) problems with pain. If there is any problem keeping an appointment, call to reschedule. SEEK MEDICAL CARE IF:   Your chest pain does not go away, even after treatment.  You have a rash with blisters on your chest.  You have a  fever. SEEK IMMEDIATE MEDICAL CARE IF:   You have increased chest pain or pain that spreads to your arm, neck, jaw, back, or abdomen.  You have shortness of breath.  You have an increasing cough, or you cough up blood.  You have severe back or abdominal pain.  You  feel nauseous or vomit.  You have severe weakness.  You faint.  You have chills. This is an emergency. Do not wait to see if the pain will go away. Get medical help at once. Call your local emergency services (911 in [place] Do not drive yourself to the hospital. MAKE SURE YOU:   Understand these instructions.  Will watch your condition.  Will get help right away if you are not doing well or get worse. Document Released: 01/30/2005 Document Revised: 04/27/2013 Document Reviewed: 11/26/2007 [facility] Patient Information 2015 [place], [place]. This information is not intended to replace advice given to you by your health care provider. Make sure you discuss any questions you have with your health care provider.  Chest Wall Pain Chest wall pain is pain in or around the bones and muscles of your chest. It may take up to 6 weeks to get better. It may take longer if you must stay physically active in your work and activities.  CAUSES  Chest wall pain may happen on its own. However, it may be caused by:  A viral illness like the flu.  Injury.  Coughing.  Exercise.  Arthritis.  Fibromyalgia.  Shingles. HOME CARE INSTRUCTIONS   Avoid overtiring physical activity. Try not to strain or perform activities that cause pain. This includes any activities using your chest or your abdominal and side muscles, especially if heavy weights are used.  Put ice on the sore area.  Put ice in a plastic bag.  Place a towel between your skin and the bag.  Leave the ice on for 15-20 minutes per hour while awake for the first 2 days.  Only take over-the-counter or prescription medicines for pain, discomfort, or fever as directed by your caregiver. SEEK IMMEDIATE MEDICAL CARE IF:   Your pain increases, or you are very uncomfortable.  You have a fever.  Your chest pain becomes worse.  You have new, unexplained symptoms.  You have nausea or vomiting.  You feel sweaty or lightheaded.  You  have a cough with phlegm (sputum), or you cough up blood. MAKE SURE YOU:   Understand these instructions.  Will watch your condition.  Will get help right away if you are not doing well or get worse. Document Released: 04/22/2005 Document Revised: 07/15/2011 Document Reviewed: 12/17/2010 [facility] Patient Information 2015 [place], [place]. This information is not intended to replace advice given to you by your health care provider. Make sure you discuss any questions you have with your health care provider.  RESOURCE GUIDE  Chronic Pain Problems: Contact [facility]  [phone] Patients need to be referred by their primary care doctor.  Insufficient Money for Medicine: Contact [employer]:  call "211."   No Primary Care Doctor: - Call [facility]  [phone] - can help you locate a primary care doctor that  accepts your insurance, provides certain services, etc. - Physician Referral Service- [phone]  Agencies that provide inexpensive medical care: - [facility]  [phone] - [facility]  [phone] - [facility]  [phone] - [facility]  [phone] - [facility]  [phone] - [facility]  [phone]  [place]  Providers: - [facility]- [address], [address]  [phone], Mon-Fri 9am-7pm, Sat 9am-1pm - [place], [place]  [place], [address]  [phone] [facility]- [address]  [place], [address], [phone]  Only accepts [place] [facility] [place] patients after they have their name  applied to their card  Self Pay (no insurance) in [place]: - Sickle Cell Patients: Dr Kevan Ny, [facility]  [place], [facility]- [place]  [facility]- [address], [facility]- see information above (Speak to [employer] if you do not have insurance)       -  [facility]- [place],  [place], [place]  Dr Vista Lawman-  [address], [place], [place], [place]       -  [facility] - [address], [phone]       -  [facility]- [address], [place], also [address] [phone]       -    [facility]- [place], [place], 1st & 3rd Saturday        every month, 10am-1pm  1) Find a Doctor and Pay Out of Pocket Although you won't have to find out who is covered by your insurance plan, it is a good idea to ask around and get recommendations. You will then need to call the office and see if the doctor you have chosen will accept you as a new patient and what types of options they offer for patients who are self-pay. Some doctors offer discounts or will set up payment plans for their patients who do not have insurance, but you will need to ask so you aren't surprised when you get to your appointment.  2) Contact Your Local Health Department Not all health departments have doctors that can see patients for sick visits, but many do, so it is worth a call to see if yours does. If you don't know where your local health department is, you can check in your phone book. The CDC also has a tool to help you locate your state's health department, and many state websites also have listings of all of their local health departments.  3) Find a [facility] If your illness is not likely to be very severe or complicated, you may want to try a walk in clinic. These are popping up all over the country in pharmacies, drugstores, and shopping centers. They're usually staffed by nurse practitioners or physician  assistants that have been trained to treat common illnesses and complaints. They're usually fairly quick and inexpensive. However, if you have serious medical issues or chronic medical problems, these are probably not your best option

## 2014-03-13 NOTE — ED Provider Notes (Signed)
CSN: [id number]     Arrival date & time 03/13/14  1305 History   First MD Initiated Contact with Patient 03/13/14 1317     Chief Complaint  Patient presents with  . Chest Pain     (Consider location/radiation/quality/duration/timing/severity/associated sxs/prior Treatment) HPI Comments: Pt is a 53 y/o female with a PMHx of ulcerative colitis who presents to the ED complaining of gradual onset left sided chest pain beginning yesterday evening while watching TV. Pain described as tight, constant, worse with certain movements rated 10/10. Pain increased throughout the day today with occasional radiation down left arm. She tried taking a gas-ex with no relief. Denies ever having pain like this in the past. Admits to associated shortness of breath on exertion only. Denies HA, n/v, diaphoresis, numbness or weakness. Non-smoker. No hx of cardiac issues. No recent surgeries or exogenous estrogen. States she has not done any physical activity out of her normal.  Patient is a 53 y.o. female presenting with chest pain. The history is provided by the patient.  Chest Pain   Past Medical History  Diagnosis Date  . Ulcerative colitis    Past Surgical History  Procedure Laterality Date  . Abdominal surgery    . Colon surgery     Family History  Problem Relation Age of Onset  . Hypertension Mother   . Stroke Mother   . Multiple sclerosis Sister    History  Substance Use Topics  . Smoking status: Never Smoker   . Smokeless tobacco: Never Used  . Alcohol Use: No   OB History    No data available     Review of Systems  Cardiovascular: Positive for chest pain.    10 Systems reviewed and are negative for acute change except as noted in the HPI.  Allergies  Flagyl and Metoclopramide hcl  Home Medications   Prior to Admission medications   Medication Sig Start Date End Date Taking? Authorizing Provider  aspirin 325 MG tablet Take 325 mg by mouth daily as needed for mild pain.   Yes  Historical Provider, MD  oxyCODONE (ROXICODONE) 15 MG immediate release tablet Take 15 mg by mouth 4 (four) times daily as needed for pain.  02/14/14  Yes Historical Provider, MD  Simethicone (GAS-X PO) Take 1 tablet by mouth as needed (gas pain).   Yes Historical Provider, MD  Aspirin-Salicylamide-Caffeine (BC HEADACHE POWDER PO) Take 1 Package by mouth daily as needed. headache    Historical Provider, MD  docusate sodium (COLACE) 50 MG capsule Take 1 capsule (50 mg total) by mouth 2 (two) times daily. 05/13/13   Ernestina Patches, MD  HYDROcodone-acetaminophen (NORCO/VICODIN) 5-325 MG per tablet Take 1-2 tablets by mouth every 4 (four) hours as needed. 03/13/14   Alinah Sheard M Peighton Mehra, PA-C  ondansetron (ZOFRAN) 4 MG tablet Take 1 tablet (4 mg total) by mouth every 6 (six) hours. 05/13/13   Ernestina Patches, MD   BP 117/74 mmHg  Pulse 72  Temp(Src) 97.7 F (36.5 C) (Oral)  Resp 16  Ht 5\' 6"  (1.676 m)  Wt 204 lb (92.534 kg)  BMI 32.94 kg/m2  SpO2 98%  LMP 05/12/2013 Physical Exam  Constitutional: She is oriented to person, place, and time. She appears well-developed and well-nourished. No distress.  HENT:  Head: Normocephalic and atraumatic.  Mouth/Throat: Oropharynx is clear and moist.  Eyes: Conjunctivae and EOM are normal. Pupils are equal, round, and reactive to light.  Neck: Normal range of motion. Neck supple. No JVD present.  Cardiovascular: Normal  rate, regular rhythm, normal heart sounds and intact distal pulses.   No extremity edema.  Pulmonary/Chest: Effort normal and breath sounds normal. No respiratory distress. She exhibits tenderness.    Abdominal: Soft. Bowel sounds are normal. There is no tenderness.  Musculoskeletal: Normal range of motion. She exhibits no edema.  Neurological: She is alert and oriented to person, place, and time. She has normal strength. No sensory deficit.  Speech fluent, goal oriented. Moves limbs without ataxia. Equal grip strength bilateral.  Skin: Skin is  warm and dry. She is not diaphoretic.  Psychiatric: She has a normal mood and affect. Her behavior is normal.  Nursing note and vitals reviewed.   ED Course  Procedures (including critical care time) Labs Review Labs Reviewed  CBC  BASIC METABOLIC PANEL  TROPONIN I    Imaging Review Dg Chest 2 View  03/13/2014   CLINICAL DATA:  52 year old female with 1 day history of left-sided chest pain  EXAM: CHEST  2 VIEW  COMPARISON:  Prior acute abdominal series including chest x-ray 05/09/2012  FINDINGS: Stable cardiac and mediastinal contours. No focal airspace consolidation, pleural effusion, edema or pneumothorax. Stable bronchitic changes and mild interstitial prominence. Faint pleural parenchymal scarring in the periphery of the right mid lung is also unchanged. No acute osseous abnormality.  IMPRESSION: Stable chest x-ray without evidence of acute cardiopulmonary process.   Electronically Signed   By: Jacqulynn Cadet M.D.   On: 03/13/2014 14:15     EKG Interpretation   Date/Time:  Sunday March 13 2014 13:17:11 EST Ventricular Rate:  79 PR Interval:  156 QRS Duration: 80 QT Interval:  374 QTC Calculation: 428 R Axis:   21 Text Interpretation:  Normal sinus rhythm Normal ECG No significant change  since last tracing Confirmed by HARRISON  MD, FORREST ([id number]) on 03/13/2014  1:23:44 PM      MDM   Final diagnoses:  Atypical chest pain   Pt presenting with chest pain, constant since last night, reproducible on exam. She is well appearing and in NAD. AFVSS. Workup unremarkable. Doubt cardiac. Low risk HEART score 1. Doubt PE. Pain 10/10 down to 4/10 after receiving toradol. She reports feeling much better. I feel pt is stable for d/c home with outpatient follow up. Will d/c home with pain medication. Return precautions given. Patient states understanding of treatment care plan and is agreeable.  Discussed with attending Dr. Aline Brochure who also evaluated patient and agrees with plan  of care.   Carman Ching, PA-C 03/13/14 [place], MD 03/13/14 1606

## 2014-03-13 NOTE — ED Notes (Signed)
Pt c/o chest pain since last night while at rest. She state "it feels like somethings poking through my chest." she took gas-x with no relief.

## 2014-08-17 ENCOUNTER — Encounter (HOSPITAL_COMMUNITY): Payer: Self-pay

## 2014-08-17 ENCOUNTER — Emergency Department (HOSPITAL_COMMUNITY)
Admission: EM | Admit: 2014-08-17 | Discharge: 2014-08-17 | Disposition: A | Discharge status: Home / Self Care | Payer: Self-pay | Attending: Emergency Medicine | Admitting: Emergency Medicine

## 2014-08-17 ENCOUNTER — Emergency Department (HOSPITAL_COMMUNITY): Payer: Self-pay

## 2014-08-17 DIAGNOSIS — Z8719 Personal history of other diseases of the digestive system: Secondary | ICD-10-CM | POA: Insufficient documentation

## 2014-08-17 DIAGNOSIS — Z791 Long term (current) use of non-steroidal anti-inflammatories (NSAID): Secondary | ICD-10-CM | POA: Insufficient documentation

## 2014-08-17 DIAGNOSIS — Z79899 Other long term (current) drug therapy: Secondary | ICD-10-CM | POA: Insufficient documentation

## 2014-08-17 DIAGNOSIS — R1013 Epigastric pain: Secondary | ICD-10-CM

## 2014-08-17 DIAGNOSIS — R197 Diarrhea, unspecified: Secondary | ICD-10-CM | POA: Insufficient documentation

## 2014-08-17 DIAGNOSIS — R112 Nausea with vomiting, unspecified: Secondary | ICD-10-CM

## 2014-08-17 LAB — URINALYSIS, ROUTINE W REFLEX MICROSCOPIC
Bilirubin Urine: NEGATIVE
GLUCOSE, UA: NEGATIVE mg/dL
Hgb urine dipstick: NEGATIVE
KETONES UR: NEGATIVE mg/dL
Leukocytes, UA: NEGATIVE
Nitrite: NEGATIVE
PROTEIN: NEGATIVE mg/dL
Specific Gravity, Urine: 1.009 (ref 1.005–1.030)
Urobilinogen, UA: 0.2 mg/dL (ref 0.0–1.0)
pH: 7 (ref 5.0–8.0)

## 2014-08-17 LAB — CBC WITH DIFFERENTIAL/PLATELET
Basophils Absolute: 0 10*3/uL (ref 0.0–0.1)
Basophils Relative: 0 % (ref 0–1)
EOS ABS: 0.1 10*3/uL (ref 0.0–0.7)
Eosinophils Relative: 1 % (ref 0–5)
HEMATOCRIT: 38.7 % (ref 36.0–46.0)
HEMOGLOBIN: 12.9 g/dL (ref 12.0–15.0)
LYMPHS ABS: 1.7 10*3/uL (ref 0.7–4.0)
Lymphocytes Relative: 30 % (ref 12–46)
MCH: 31.9 pg (ref 26.0–34.0)
MCHC: 33.3 g/dL (ref 30.0–36.0)
MCV: 95.6 fL (ref 78.0–100.0)
MONOS PCT: 6 % (ref 3–12)
Monocytes Absolute: 0.4 10*3/uL (ref 0.1–1.0)
Neutro Abs: 3.4 10*3/uL (ref 1.7–7.7)
Neutrophils Relative %: 63 % (ref 43–77)
Platelets: 244 10*3/uL (ref 150–400)
RBC: 4.05 MIL/uL (ref 3.87–5.11)
RDW: 12.7 % (ref 11.5–15.5)
WBC: 5.5 10*3/uL (ref 4.0–10.5)

## 2014-08-17 LAB — COMPREHENSIVE METABOLIC PANEL
ALBUMIN: 3.4 g/dL — AB (ref 3.5–5.2)
ALT: 16 U/L (ref 0–35)
ANION GAP: 10 (ref 5–15)
AST: 16 U/L (ref 0–37)
Alkaline Phosphatase: 78 U/L (ref 39–117)
BILIRUBIN TOTAL: 0.3 mg/dL (ref 0.3–1.2)
BUN: 12 mg/dL (ref 6–23)
CO2: 24 mmol/L (ref 19–32)
CREATININE: 0.6 mg/dL (ref 0.50–1.10)
Calcium: 9.2 mg/dL (ref 8.4–10.5)
Chloride: 104 mmol/L (ref 96–112)
GFR calc Af Amer: >90 mL/min (ref >90)
GFR calc non Af Amer: >90 mL/min (ref >90)
Glucose, Bld: 89 mg/dL (ref 70–99)
Potassium: 3.4 mmol/L — ABNORMAL LOW (ref 3.5–5.1)
Sodium: 138 mmol/L (ref 135–145)
Total Protein: 6.8 g/dL (ref 6.0–8.3)

## 2014-08-17 LAB — POC OCCULT BLOOD, ED: Fecal Occult Bld: POSITIVE — AB

## 2014-08-17 LAB — LIPASE, BLOOD: LIPASE: 22 U/L (ref 11–59)

## 2014-08-17 MED ORDER — GI COCKTAIL ~~LOC~~
30.0000 mL | Freq: Once | ORAL | Status: AC
  Administered 2014-08-17: 30 mL via ORAL
  Filled 2014-08-17: qty 30

## 2014-08-17 MED ORDER — SODIUM CHLORIDE 0.9 % IV BOLUS (SEPSIS)
1000.0000 mL | Freq: Once | INTRAVENOUS | Status: AC
  Administered 2014-08-17: 1000 mL via INTRAVENOUS

## 2014-08-17 MED ORDER — ONDANSETRON HCL 4 MG/2ML IJ SOLN
4.0000 mg | Freq: Once | INTRAMUSCULAR | Status: AC
  Administered 2014-08-17: 4 mg via INTRAVENOUS
  Filled 2014-08-17: qty 2

## 2014-08-17 MED ORDER — IOHEXOL 300 MG/ML  SOLN
25.0000 mL | Freq: Once | INTRAMUSCULAR | Status: AC | PRN
  Administered 2014-08-17: 25 mL via ORAL

## 2014-08-17 MED ORDER — HYDROMORPHONE HCL 1 MG/ML IJ SOLN
1.0000 mg | Freq: Once | INTRAMUSCULAR | Status: AC
Start: 2014-08-17 — End: 2014-08-17
  Administered 2014-08-17: 1 mg via INTRAVENOUS
  Filled 2014-08-17: qty 1

## 2014-08-17 MED ORDER — OMEPRAZOLE 20 MG PO CPDR: 20.0000 mg | DELAYED_RELEASE_CAPSULE | Freq: Every day | ORAL | Status: DC

## 2014-08-17 MED ORDER — HYDROMORPHONE HCL 1 MG/ML IJ SOLN
1.0000 mg | Freq: Once | INTRAMUSCULAR | Status: AC
  Administered 2014-08-17: 1 mg via INTRAVENOUS
  Filled 2014-08-17: qty 1

## 2014-08-17 MED ORDER — ONDANSETRON 4 MG PO TBDP: 4.0000 mg | ORAL_TABLET | Freq: Three times a day (TID) | ORAL | Status: DC | PRN

## 2014-08-17 MED ORDER — IOHEXOL 300 MG/ML  SOLN: 100.0000 mL | Freq: Once | INTRAMUSCULAR | Status: DC | PRN

## 2014-08-17 NOTE — ED Notes (Signed)
Pt unhooked to go to the bathroom.  

## 2014-08-17 NOTE — ED Notes (Signed)
Pt reports abd pain onset Tuesday no improvement now vomiting

## 2014-08-17 NOTE — ED Provider Notes (Signed)
CSN: [id number]     Arrival date & time 08/17/14  6606 History   First MD Initiated Contact with Patient 08/17/14 [phone]     Chief Complaint  Patient presents with  . Abdominal Pain   Crystal Benton is a 54 y.o. female with a history of ulcerative colitis and previous abdominal abscess who presents to the emergency department complaining of epigastric pain ongoing for the past 3 days that is worsened since yesterday. She reports her pain is 9 out of 10 and worse sitting up and better lying down. Patient also complains of lots of nausea and vomiting. Patient reports she has not vomited today as she has not had anything to eat or drink. She reports she has bad pain immediately after eating. She also reports lots of burping and belching. Patient also reports feeling very constipated recently and has been straining to stool. She reports yesterday she had a loose stool with some bright red blood streaked. She reports taking Aleve earlier today without relief. The patient is not currently followed by gastroenterologist. The patient's previous abdominal surgery includes a C-section and surgery for her abscess. The patient denies fevers, hematemesis, chest pain, shortness of breath, urinary symptoms, or history of gallstones or pancreatitis.   (Consider location/radiation/quality/duration/timing/severity/associated sxs/prior Treatment) HPI  Past Medical History  Diagnosis Date  . Ulcerative colitis    Past Surgical History  Procedure Laterality Date  . Abdominal surgery    . Colon surgery     Family History  Problem Relation Age of Onset  . Hypertension Mother   . Stroke Mother   . Multiple sclerosis Sister    History  Substance Use Topics  . Smoking status: Never Smoker   . Smokeless tobacco: Never Used  . Alcohol Use: No   OB History    No data available     Review of Systems  Constitutional: Positive for chills. Negative for fever.  HENT: Negative for congestion and sore throat.   Eyes:  Negative for visual disturbance.  Respiratory: Negative for cough, shortness of breath and wheezing.   Cardiovascular: Negative for chest pain and palpitations.  Gastrointestinal: Positive for nausea, vomiting, abdominal pain, diarrhea and blood in stool.  Genitourinary: Negative for dysuria, urgency, frequency, hematuria, flank pain and difficulty urinating.  Musculoskeletal: Negative for back pain and neck pain.  Skin: Negative for rash.  Neurological: Negative for light-headedness and headaches.      Allergies  Flagyl and Metoclopramide hcl  Home Medications   Prior to Admission medications   Medication Sig Start Date End Date Taking? Authorizing Provider  naproxen sodium (ANAPROX) 220 MG tablet Take 220 mg by mouth 2 (two) times daily as needed (for pain).   Yes Historical Provider, MD  docusate sodium (COLACE) 50 MG capsule Take 1 capsule (50 mg total) by mouth 2 (two) times daily. Patient not taking: Reported on 08/17/2014 05/13/13   Ernestina Patches, MD  HYDROcodone-acetaminophen (NORCO/VICODIN) 5-325 MG per tablet Take 1-2 tablets by mouth every 4 (four) hours as needed. Patient not taking: Reported on 08/17/2014 03/13/14   Carman Ching, PA-C  omeprazole (PRILOSEC) 20 MG capsule Take 1 capsule (20 mg total) by mouth daily. 08/17/14   Waynetta Pean, PA-C  ondansetron (ZOFRAN ODT) 4 MG disintegrating tablet Take 1 tablet (4 mg total) by mouth every 8 (eight) hours as needed for nausea or vomiting. 08/17/14   Waynetta Pean, PA-C  ondansetron (ZOFRAN) 4 MG tablet Take 1 tablet (4 mg total) by mouth every 6 (six) hours.  Patient not taking: Reported on 08/17/2014 05/13/13   Ernestina Patches, MD   BP 114/74 mmHg  Pulse 66  Temp(Src) 97.6 F (36.4 C) (Oral)  Resp 16  Ht 5\' 7"  (1.702 m)  Wt 210 lb (95.255 kg)  BMI 32.88 kg/m2  SpO2 96%  LMP 05/12/2013 Physical Exam  Constitutional: She appears well-developed and well-nourished. No distress.  HENT:  Head: Normocephalic and atraumatic.   Mouth/Throat: Oropharynx is clear and moist.  Eyes: Conjunctivae are normal. Pupils are equal, round, and reactive to light. Right eye exhibits no discharge. Left eye exhibits no discharge.  Neck: Neck supple. No JVD present.  Cardiovascular: Normal rate, regular rhythm, normal heart sounds and intact distal pulses.  Exam reveals no gallop and no friction rub.   No murmur heard. Pulmonary/Chest: Effort normal and breath sounds normal. No respiratory distress. She has no wheezes. She has no rales.  Abdominal: Soft. Bowel sounds are normal. She exhibits no distension and no mass. There is tenderness. There is guarding. There is no rebound.  Abdomen is soft. Bowel sounds are present. Moderate epigastric tenderness with some guarding. No masses. Negative psoas and obturator sign.   Genitourinary: Guaiac positive stool.  Digital rectal exam performed by me with female nursing tech chaperone. There are multiple external hemorrhoids present. Anal sphincter tone is normal. No gross bloody stool.  Musculoskeletal: She exhibits no edema.  Lymphadenopathy:    She has no cervical adenopathy.  Neurological: She is alert. Coordination normal.  Skin: Skin is warm and dry. No rash noted. She is not diaphoretic. No erythema. No pallor.  Psychiatric: She has a normal mood and affect. Her behavior is normal.  Nursing note and vitals reviewed.   ED Course  Procedures (including critical care time) Labs Review Labs Reviewed  COMPREHENSIVE METABOLIC PANEL - Abnormal; Notable for the following:    Potassium 3.4 (*)    Albumin 3.4 (*)    All other components within normal limits  POC OCCULT BLOOD, ED - Abnormal; Notable for the following:    Fecal Occult Bld POSITIVE (*)    All other components within normal limits  CBC WITH DIFFERENTIAL/PLATELET  LIPASE, BLOOD  URINALYSIS, ROUTINE W REFLEX MICROSCOPIC    Imaging Review Ct Abdomen Pelvis W Contrast  08/17/2014   CLINICAL DATA:  Abdominal pain.   Colitis.  EXAM: CT ABDOMEN AND PELVIS WITH CONTRAST  TECHNIQUE: Multidetector CT imaging of the abdomen and pelvis was performed using the standard protocol following bolus administration of intravenous contrast.  CONTRAST:  100 mL Omnipaque 300.  COMPARISON:  CT 05/13/2013.  FINDINGS: Musculoskeletal: No aggressive osseous lesions. Lumbosacral transitional anatomy. Severe sacroiliac joint osteoarthritis is present with vacuum joint and sclerosis on both sides of the joint. No ankylosis. No definite erosions. Lower lumbar facet arthrosis.  Lung Bases: Atelectasis.  Liver:  Normal.  Spleen:  Normal.  Gallbladder:  Normal.  Common bile duct:  Normal.  Pancreas:  Normal.  Adrenal glands:  Normal.  Kidneys: Normal enhancement and delayed excretion of contrast. No calculi. LEFT ureter normal. RIGHT ureter normal.  Stomach:  Normal.  Small bowel:  Normal small bowel.  No mesenteric adenopathy.  Colon:   Normal appendix.  No inflammatory changes of colon.  Pelvic Genitourinary: The heterogeneous appearance of the endometrium has improved compared to the prior exam from 05/13/2013. Presumably this has undergone gynecologic workup. 33 mm LEFT adnexal cystic lesion likely represents an ovarian cyst.  Peritoneum: Physiologic free fluid in the anatomic pelvis.  Vasculature: Normal.  Body Wall: Normal.  IMPRESSION: 1. No acute abdominal abnormality. 2. Improved appearance of the endometrium compared to prior exam. Previously, the appearance was concerning for endometrial abnormality. Workup was recommended at that time. Correlate for prior workup history. 3. Mm LEFT ovarian cystic lesion. This has benign imaging characteristics. If this woman is early postmenopausal, 6-12 week follow-up ultrasound is recommended to assess for resolution. If the woman is premenopausal, no further evaluation is warranted. This recommendation follows ACR consensus guidelines: White Paper of the ACR Incidental Findings Committee II on Adnexal  Findings. J Am Coll Radiol [phone]. 4. Chronic sacroiliitis.   Electronically Signed   By: Dereck Ligas M.D.   On: 08/17/2014 11:35     EKG Interpretation   Date/Time:  Wednesday August 17 2014 08:29:23 EDT Ventricular Rate:  61 PR Interval:  169 QRS Duration: 87 QT Interval:  412 QTC Calculation: 415 R Axis:   24 Text Interpretation:  Sinus rhythm No significant change since last  tracing Confirmed by YAO  MD, DAVID ([id number]) on 08/17/2014 8:36:21 AM      Filed Vitals:   08/17/14 0900 08/17/14 0915 08/17/14 1015 08/17/14 1300  BP: 114/61 121/71 105/62 114/74  Pulse: 57 67 56 66  Temp:      TempSrc:      Resp: 12 13 16    Height:      Weight:      SpO2: 96% 98% 97% 96%     MDM   Meds given in ED:  Medications  iohexol (OMNIPAQUE) 300 MG/ML solution 100 mL (not administered)  gi cocktail (Maalox,Lidocaine,Donnatal) (not administered)  sodium chloride 0.9 % bolus 1,000 mL (1,000 mLs Intravenous New Bag/Given 08/17/14 0830)  ondansetron (ZOFRAN) injection 4 mg (4 mg Intravenous Given 08/17/14 0830)  HYDROmorphone (DILAUDID) injection 1 mg (1 mg Intravenous Given 08/17/14 0830)  iohexol (OMNIPAQUE) 300 MG/ML solution 25 mL (25 mLs Oral Contrast Given 08/17/14 0912)  HYDROmorphone (DILAUDID) injection 1 mg (1 mg Intravenous Given 08/17/14 1137)    New Prescriptions   OMEPRAZOLE (PRILOSEC) 20 MG CAPSULE    Take 1 capsule (20 mg total) by mouth daily.   ONDANSETRON (ZOFRAN ODT) 4 MG DISINTEGRATING TABLET    Take 1 tablet (4 mg total) by mouth every 8 (eight) hours as needed for nausea or vomiting.    Final diagnoses:  Epigastric pain  Non-intractable vomiting with nausea, vomiting of unspecified type   This is a 54 y.o. female with a history of ulcerative colitis and previous abdominal abscess who presents to the emergency department complaining of epigastric pain ongoing for the past 3 days that is worsened since yesterday. She reports pain right after eating. She  reports lots of burping and belching recently. On exam the patient is afebrile and nontoxic appearing. She has moderate epigastric tenderness to palpation. No lower abdominal pain. She has a history of ulcerative colitis and previous abscess. Will obtain CT scan with contrast at this time.  On digital rectal exam the patient has multiple hemorrhoids and a guaiac positive stool. There is no gross bloody stool on exam. Urinalysis is negative for infection. CBC is within normal limits and no elevated white count. CMP is unremarkable. Lipase is within normal limits. CT abdomen and pelvis indicates no acute abdominal abnormality. It does show 33 mm left ovarian cystic lesion. I spoke with the patient about this and she reports she is aware of this and has had this for many years. Patient reports her pain has improved after pain medicine.  She has no lower abdominal pain. She is tolerated liquids by mouth and the ED. She has not vomited since arrival to the emergency department. We'll give her a GI cocktail and have her start a proton pump inhibitor for likely peptic ulcer. Strict return precautions provided. I advised the patient to follow-up with their primary care provider this week. I advised the patient to return to the emergency department with new or worsening symptoms or new concerns. The patient verbalized understanding and agreement with plan.   This patient was discussed with Dr. Darl Householder who agrees with assessment and plan.    Waynetta Pean, PA-C 08/17/14 [place] Yao, MD 08/18/14 [phone]

## 2014-08-17 NOTE — Discharge Instructions (Signed)
Abdominal Pain, Women °Abdominal (stomach, pelvic, or belly) pain can be caused by many things. It is important to tell your doctor: °· The location of the pain. °· Does it come and go or is it present all the time? °· Are there things that start the pain (eating certain foods, exercise)? °· Are there other symptoms associated with the pain (fever, nausea, vomiting, diarrhea)? °All of this is helpful to know when trying to find the cause of the pain. °CAUSES  °· Stomach: virus or bacteria infection, or ulcer. °· Intestine: appendicitis (inflamed appendix), regional ileitis (Crohn's disease), ulcerative colitis (inflamed colon), irritable bowel syndrome, diverticulitis (inflamed diverticulum of the colon), or cancer of the stomach or intestine. °· Gallbladder disease or stones in the gallbladder. °· Kidney disease, kidney stones, or infection. °· Pancreas infection or cancer. °· Fibromyalgia (pain disorder). °· Diseases of the female organs: °· Uterus: fibroid (non-cancerous) tumors or infection. °· Fallopian tubes: infection or tubal pregnancy. °· Ovary: cysts or tumors. °· Pelvic adhesions (scar tissue). °· Endometriosis (uterus lining tissue growing in the pelvis and on the pelvic organs). °· Pelvic congestion syndrome (female organs filling up with blood just before the menstrual period). °· Pain with the menstrual period. °· Pain with ovulation (producing an egg). °· Pain with an IUD (intrauterine device, birth control) in the uterus. °· Cancer of the female organs. °· Functional pain (pain not caused by a disease, may improve without treatment). °· Psychological pain. °· Depression. °DIAGNOSIS  °Your doctor will decide the seriousness of your pain by doing an examination. °· Blood tests. °· X-rays. °· Ultrasound. °· CT scan (computed tomography, special type of X-ray). °· MRI (magnetic resonance imaging). °· Cultures, for infection. °· Barium enema (dye inserted in the large intestine, to better view it with  X-rays). °· Colonoscopy (looking in intestine with a lighted tube). °· Laparoscopy (minor surgery, looking in abdomen with a lighted tube). °· Major abdominal exploratory surgery (looking in abdomen with a large incision). °TREATMENT  °The treatment will depend on the cause of the pain.  °· Many cases can be observed and treated at home. °· Over-the-counter medicines recommended by your caregiver. °· Prescription medicine. °· Antibiotics, for infection. °· Birth control pills, for painful periods or for ovulation pain. °· Hormone treatment, for endometriosis. °· Nerve blocking injections. °· Physical therapy. °· Antidepressants. °· Counseling with a psychologist or psychiatrist. °· Minor or major surgery. °HOME CARE INSTRUCTIONS  °· Do not take laxatives, unless directed by your caregiver. °· Take over-the-counter pain medicine only if ordered by your caregiver. Do not take aspirin because it can cause an upset stomach or bleeding. °· Try a clear liquid diet (broth or water) as ordered by your caregiver. Slowly move to a bland diet, as tolerated, if the pain is related to the stomach or intestine. °· Have a thermometer and take your temperature several times a day, and record it. °· Bed rest and sleep, if it helps the pain. °· Avoid sexual intercourse, if it causes pain. °· Avoid stressful situations. °· Keep your follow-up appointments and tests, as your caregiver orders. °· If the pain does not go away with medicine or surgery, you may try: °· Acupuncture. °· Relaxation exercises (yoga, meditation). °· Group therapy. °· Counseling. °SEEK MEDICAL CARE IF:  °· You notice certain foods cause stomach pain. °· Your home care treatment is not helping your pain. °· You need stronger pain medicine. °· You want your IUD removed. °· You feel faint or   lightheaded.  You develop nausea and vomiting.  You develop a rash.  You are having side effects or an allergy to your medicine. SEEK IMMEDIATE MEDICAL CARE IF:   Your  pain does not go away or gets worse.  You have a fever.  Your pain is felt only in portions of the abdomen. The right side could possibly be appendicitis. The left lower portion of the abdomen could be colitis or diverticulitis.  You are passing blood in your stools (bright red or black tarry stools, with or without vomiting).  You have blood in your urine.  You develop chills, with or without a fever.  You pass out. MAKE SURE YOU:   Understand these instructions.  Will watch your condition.  Will get help right away if you are not doing well or get worse. Document Released: 02/17/2007 Document Revised: 09/06/2013 Document Reviewed: 03/09/2009 [facility] Patient Information 2015 [place], [place]. This information is not intended to replace advice given to you by your health care provider. Make sure you discuss any questions you have with your health care provider. Peptic Ulcer A peptic ulcer is a sore in the lining of your esophagus (esophageal ulcer), stomach (gastric ulcer), or in the first part of your small intestine (duodenal ulcer). The ulcer causes erosion into the deeper tissue. CAUSES  Normally, the lining of the stomach and the small intestine protects itself from the acid that digests food. The protective lining can be damaged by:  An infection caused by a bacterium called Helicobacter pylori (H. pylori).  Regular use of nonsteroidal anti-inflammatory drugs (NSAIDs), such as ibuprofen or aspirin.  Smoking tobacco. Other risk factors include being older than 80, drinking alcohol excessively, and having a family history of ulcer disease.  SYMPTOMS   Burning pain or gnawing in the area between the chest and the belly button.  Heartburn.  Nausea and vomiting.  Bloating. The pain can be worse on an empty stomach and at night. If the ulcer results in bleeding, it can cause:  Black, tarry stools.  Vomiting of bright red blood.  Vomiting of coffee-ground-looking  materials. DIAGNOSIS  A diagnosis is usually made based upon your history and an exam. Other tests and procedures may be performed to find the cause of the ulcer. Finding a cause will help determine the best treatment. Tests and procedures may include:  Blood tests, stool tests, or breath tests to check for the bacterium H. pylori.  An upper gastrointestinal (GI) series of the esophagus, stomach, and small intestine.  An endoscopy to examine the esophagus, stomach, and small intestine.  A biopsy. TREATMENT  Treatment may include:  Eliminating the cause of the ulcer, such as smoking, NSAIDs, or alcohol.  Medicines to reduce the amount of acid in your digestive tract.  Antibiotic medicines if the ulcer is caused by the H. pylori bacterium.  An upper endoscopy to treat a bleeding ulcer.  Surgery if the bleeding is severe or if the ulcer created a hole somewhere in the digestive system. HOME CARE INSTRUCTIONS   Avoid tobacco, alcohol, and caffeine. Smoking can increase the acid in the stomach, and continued smoking will impair the healing of ulcers.  Avoid foods and drinks that seem to cause discomfort or aggravate your ulcer.  Only take medicines as directed by your caregiver. Do not substitute over-the-counter medicines for prescription medicines without talking to your caregiver.  Keep any follow-up appointments and tests as directed. SEEK MEDICAL CARE IF:   Your do not improve within 7  days of starting treatment.  You have ongoing indigestion or heartburn. SEEK IMMEDIATE MEDICAL CARE IF:   You have sudden, sharp, or persistent abdominal pain.  You have bloody or dark black, tarry stools.  You vomit blood or vomit that looks like coffee grounds.  You become light-headed, weak, or feel faint.  You become sweaty or clammy. MAKE SURE YOU:   Understand these instructions.  Will watch your condition.  Will get help right away if you are not doing well or get  worse. Document Released: 04/19/2000 Document Revised: 09/06/2013 Document Reviewed: 11/20/2011 [facility] Patient Information 2015 [place], [place]. This information is not intended to replace advice given to you by your health care provider. Make sure you discuss any questions you have with your health care provider. Nausea and Vomiting Nausea is a sick feeling that often comes before throwing up (vomiting). Vomiting is a reflex where stomach contents come out of your mouth. Vomiting can cause severe loss of body fluids (dehydration). Children and elderly adults can become dehydrated quickly, especially if they also have diarrhea. Nausea and vomiting are symptoms of a condition or disease. It is important to find the cause of your symptoms. CAUSES   Direct irritation of the stomach lining. This irritation can result from increased acid production (gastroesophageal reflux disease), infection, food poisoning, taking certain medicines (such as nonsteroidal anti-inflammatory drugs), alcohol use, or tobacco use.  Signals from the brain.These signals could be caused by a headache, heat exposure, an inner ear disturbance, increased pressure in the brain from injury, infection, a tumor, or a concussion, pain, emotional stimulus, or metabolic problems.  An obstruction in the gastrointestinal tract (bowel obstruction).  Illnesses such as diabetes, hepatitis, gallbladder problems, appendicitis, kidney problems, cancer, sepsis, atypical symptoms of a heart attack, or eating disorders.  Medical treatments such as chemotherapy and radiation.  Receiving medicine that makes you sleep (general anesthetic) during surgery. DIAGNOSIS Your caregiver may ask for tests to be done if the problems do not improve after a few days. Tests may also be done if symptoms are severe or if the reason for the nausea and vomiting is not clear. Tests may include:  Urine tests.  Blood tests.  Stool tests.  Cultures (to look  for evidence of infection).  X-rays or other imaging studies. Test results can help your caregiver make decisions about treatment or the need for additional tests. TREATMENT You need to stay well hydrated. Drink frequently but in small amounts.You may wish to drink water, sports drinks, clear broth, or eat frozen ice pops or gelatin dessert to help stay hydrated.When you eat, eating slowly may help prevent nausea.There are also some antinausea medicines that may help prevent nausea. HOME CARE INSTRUCTIONS   Take all medicine as directed by your caregiver.  If you do not have an appetite, do not force yourself to eat. However, you must continue to drink fluids.  If you have an appetite, eat a normal diet unless your caregiver tells you differently.  Eat a variety of complex carbohydrates (rice, wheat, potatoes, bread), lean meats, yogurt, fruits, and vegetables.  Avoid high-fat foods because they are more difficult to digest.  Drink enough water and fluids to keep your urine clear or pale yellow.  If you are dehydrated, ask your caregiver for specific rehydration instructions. Signs of dehydration may include:  Severe thirst.  Dry lips and mouth.  Dizziness.  Dark urine.  Decreasing urine frequency and amount.  Confusion.  Rapid breathing or pulse. [place]  CARE IF:   You have blood or brown flecks (like coffee grounds) in your vomit.  You have black or bloody stools.  You have a severe headache or stiff neck.  You are confused.  You have severe abdominal pain.  You have chest pain or trouble breathing.  You do not urinate at least once every 8 hours.  You develop cold or clammy skin.  You continue to vomit for longer than 24 to 48 hours.  You have a fever. MAKE SURE YOU:   Understand these instructions.  Will watch your condition.  Will get help right away if you are not doing well or get worse. Document Released: 04/22/2005 Document  Revised: 07/15/2011 Document Reviewed: 09/19/2010 [facility] Patient Information 2015 [place], [place]. This information is not intended to replace advice given to you by your health care provider. Make sure you discuss any questions you have with your health care provider.

## 2015-04-04 ENCOUNTER — Emergency Department (HOSPITAL_COMMUNITY): Payer: Self-pay

## 2015-04-04 ENCOUNTER — Encounter (HOSPITAL_COMMUNITY): Payer: Self-pay | Admitting: Emergency Medicine

## 2015-04-04 ENCOUNTER — Emergency Department (HOSPITAL_COMMUNITY)
Admission: EM | Admit: 2015-04-04 | Discharge: 2015-04-04 | Disposition: A | Discharge status: Home / Self Care | Payer: Self-pay | Attending: Emergency Medicine | Admitting: Emergency Medicine

## 2015-04-04 DIAGNOSIS — K625 Hemorrhage of anus and rectum: Secondary | ICD-10-CM | POA: Insufficient documentation

## 2015-04-04 DIAGNOSIS — R112 Nausea with vomiting, unspecified: Secondary | ICD-10-CM | POA: Insufficient documentation

## 2015-04-04 DIAGNOSIS — R197 Diarrhea, unspecified: Secondary | ICD-10-CM | POA: Insufficient documentation

## 2015-04-04 DIAGNOSIS — R1013 Epigastric pain: Secondary | ICD-10-CM | POA: Insufficient documentation

## 2015-04-04 LAB — CBC WITH DIFFERENTIAL/PLATELET
Basophils Absolute: 0 10*3/uL (ref 0.0–0.1)
Basophils Relative: 0 %
Eosinophils Absolute: 0.1 10*3/uL (ref 0.0–0.7)
Eosinophils Relative: 3 %
HEMATOCRIT: 38.8 % (ref 36.0–46.0)
HEMOGLOBIN: 13 g/dL (ref 12.0–15.0)
LYMPHS ABS: 1.8 10*3/uL (ref 0.7–4.0)
LYMPHS PCT: 40 %
MCH: 32.3 pg (ref 26.0–34.0)
MCHC: 33.5 g/dL (ref 30.0–36.0)
MCV: 96.3 fL (ref 78.0–100.0)
Monocytes Absolute: 0.2 10*3/uL (ref 0.1–1.0)
Monocytes Relative: 4 %
NEUTROS ABS: 2.4 10*3/uL (ref 1.7–7.7)
NEUTROS PCT: 53 %
Platelets: 248 10*3/uL (ref 150–400)
RBC: 4.03 MIL/uL (ref 3.87–5.11)
RDW: 13 % (ref 11.5–15.5)
WBC: 4.6 10*3/uL (ref 4.0–10.5)

## 2015-04-04 LAB — COMPREHENSIVE METABOLIC PANEL
ALK PHOS: 94 U/L (ref 38–126)
ALT: 24 U/L (ref 14–54)
AST: 24 U/L (ref 15–41)
Albumin: 3.8 g/dL (ref 3.5–5.0)
Anion gap: 8 (ref 5–15)
BUN: 9 mg/dL (ref 6–20)
CALCIUM: 9.3 mg/dL (ref 8.9–10.3)
CO2: 24 mmol/L (ref 22–32)
CREATININE: 0.75 mg/dL (ref 0.44–1.00)
Chloride: 110 mmol/L (ref 101–111)
GFR calc non Af Amer: >60 mL/min (ref >60)
GLUCOSE: 113 mg/dL — AB (ref 65–99)
Potassium: 3.4 mmol/L — ABNORMAL LOW (ref 3.5–5.1)
Sodium: 142 mmol/L (ref 135–145)
Total Bilirubin: 0.3 mg/dL (ref 0.3–1.2)
Total Protein: 6.8 g/dL (ref 6.5–8.1)

## 2015-04-04 LAB — URINALYSIS, ROUTINE W REFLEX MICROSCOPIC
Bilirubin Urine: NEGATIVE
Glucose, UA: NEGATIVE mg/dL
Hgb urine dipstick: NEGATIVE
KETONES UR: NEGATIVE mg/dL
Nitrite: NEGATIVE
Protein, ur: NEGATIVE mg/dL
Specific Gravity, Urine: 1.014 (ref 1.005–1.030)
pH: 6 (ref 5.0–8.0)

## 2015-04-04 LAB — URINE MICROSCOPIC-ADD ON

## 2015-04-04 LAB — LIPASE, BLOOD: Lipase: 30 U/L (ref 11–51)

## 2015-04-04 LAB — POC OCCULT BLOOD, ED: Fecal Occult Bld: NEGATIVE

## 2015-04-04 MED ORDER — IOHEXOL 300 MG/ML  SOLN
100.0000 mL | Freq: Once | INTRAMUSCULAR | Status: AC | PRN
  Administered 2015-04-04: 100 mL via INTRAVENOUS

## 2015-04-04 MED ORDER — SODIUM CHLORIDE 0.9 % IV BOLUS (SEPSIS)
500.0000 mL | Freq: Once | INTRAVENOUS | Status: AC
  Administered 2015-04-04: 500 mL via INTRAVENOUS

## 2015-04-04 MED ORDER — ONDANSETRON 4 MG PO TBDP: 4.0000 mg | ORAL_TABLET | Freq: Three times a day (TID) | ORAL | Status: DC | PRN

## 2015-04-04 MED ORDER — ONDANSETRON 4 MG PO TBDP
8.0000 mg | ORAL_TABLET | Freq: Once | ORAL | Status: AC
  Administered 2015-04-04: 8 mg via ORAL
  Filled 2015-04-04: qty 2

## 2015-04-04 MED ORDER — MORPHINE SULFATE (PF) 4 MG/ML IV SOLN
4.0000 mg | Freq: Once | INTRAVENOUS | Status: AC
  Administered 2015-04-04: 4 mg via INTRAVENOUS
  Filled 2015-04-04: qty 1

## 2015-04-04 MED ORDER — PANTOPRAZOLE SODIUM 40 MG IV SOLR
40.0000 mg | Freq: Once | INTRAVENOUS | Status: AC
  Administered 2015-04-04: 40 mg via INTRAVENOUS
  Filled 2015-04-04: qty 40

## 2015-04-04 MED ORDER — ONDANSETRON 8 MG PO TBDP: 8.0000 mg | ORAL_TABLET | Freq: Once | ORAL | Status: DC

## 2015-04-04 MED ORDER — GI COCKTAIL ~~LOC~~
30.0000 mL | Freq: Once | ORAL | Status: AC
  Administered 2015-04-04: 30 mL via ORAL
  Filled 2015-04-04: qty 30

## 2015-04-04 NOTE — Discharge Instructions (Signed)

## 2015-04-04 NOTE — ED Notes (Signed)
MD at bedside. 

## 2015-04-04 NOTE — ED Notes (Signed)
Patient transported to CT 

## 2015-04-04 NOTE — ED Notes (Signed)
Pt sts mid abd pain x 3 days with some rectal bleeding that is black in nature; pt sts hx of UC

## 2015-04-04 NOTE — ED Provider Notes (Signed)
CSN: [id number]     Arrival date & time 04/04/15  [id number] History   First MD Initiated Contact with Patient 04/04/15 1019     Chief Complaint  Patient presents with  . Abdominal Pain  . Rectal Bleeding     (Consider location/radiation/quality/duration/timing/severity/associated sxs/prior Treatment)  HPI  Patient is a 54 year old female with history of ulcerative colitis and prior intraabdominal abscesses who presents with 3 days of severe epigastric pain and dark tarry stools. Pain is described as burning and does not radiate. Patient tried taking BC powders for the pain which did not significantly help. Patient has had similar episodes of abdominal pain in the past which she attributes to her history of ulcerative colitis. Patient also reports that she noticed bright red stool 3 days ago that changed to dark, tarry stools yesterday and today. Patient reports that her last bowel movement was this morning and was loose and watery with black, tarry stool. Patient also reports significant nausea and vomiting and has not been able to keep anything down since yesterday. She has had subjective fevers and chills for the past few days. No chest pain or shortness of breath. No dysuria. No weakness or numbness.   Past Medical History  Diagnosis Date  . Ulcerative colitis    Past Surgical History  Procedure Laterality Date  . Abdominal surgery    . Colon surgery     Family History  Problem Relation Age of Onset  . Hypertension Mother   . Stroke Mother   . Multiple sclerosis Sister    Social History  Substance Use Topics  . Smoking status: Never Smoker   . Smokeless tobacco: Never Used  . Alcohol Use: Yes     Comment: occ   OB History    No data available     Review of Systems  Constitutional: Positive for fever and chills.  HENT: Negative.   Eyes: Negative.   Respiratory: Negative for shortness of breath.   Cardiovascular: Negative for chest pain.  Gastrointestinal: Positive for  nausea, vomiting, abdominal pain, diarrhea and blood in stool.  Endocrine: Negative.   Genitourinary: Negative.   Musculoskeletal: Negative.   Skin: Negative.   Allergic/Immunologic: Negative.   Neurological: Negative.   Hematological: Negative.   Psychiatric/Behavioral: Negative.       Allergies  Flagyl and Metoclopramide hcl  Home Medications   Prior to Admission medications   Medication Sig Start Date End Date Taking? Authorizing Provider  docusate sodium (COLACE) 50 MG capsule Take 1 capsule (50 mg total) by mouth 2 (two) times daily. Patient not taking: Reported on 08/17/2014 05/13/13   Ernestina Patches, MD  HYDROcodone-acetaminophen (NORCO/VICODIN) 5-325 MG per tablet Take 1-2 tablets by mouth every 4 (four) hours as needed. Patient not taking: Reported on 08/17/2014 03/13/14   Carman Ching, PA-C  naproxen sodium (ANAPROX) 220 MG tablet Take 220 mg by mouth 2 (two) times daily as needed (for pain).    Historical Provider, MD  omeprazole (PRILOSEC) 20 MG capsule Take 1 capsule (20 mg total) by mouth daily. 08/17/14   Waynetta Pean, PA-C  ondansetron (ZOFRAN ODT) 4 MG disintegrating tablet Take 1 tablet (4 mg total) by mouth every 8 (eight) hours as needed for nausea or vomiting. 04/04/15   Vivi Barrack, MD  ondansetron (ZOFRAN) 4 MG tablet Take 1 tablet (4 mg total) by mouth every 6 (six) hours. Patient not taking: Reported on 08/17/2014 05/13/13   Ernestina Patches, MD  ondansetron (ZOFRAN-ODT) 8 MG disintegrating  tablet Take 1 tablet (8 mg total) by mouth once. 04/04/15   Vivi Barrack, MD   BP 111/100 mmHg  Pulse 80  Temp(Src) 98.2 F (36.8 C) (Oral)  Resp 17  SpO2 99%  LMP 05/12/2013 Physical Exam  Constitutional: She is oriented to person, place, and time. She appears well-developed and well-nourished.  HENT:  Head: Normocephalic and atraumatic.  Eyes: EOM are normal. Pupils are equal, round, and reactive to light.  Neck: Normal range of motion. Neck supple.   Cardiovascular: Normal rate, regular rhythm and normal heart sounds.   No murmur heard. Pulmonary/Chest: Effort normal and breath sounds normal. No respiratory distress.  Abdominal: Soft. Bowel sounds are normal. There is tenderness in the epigastric area and left lower quadrant. There is no rigidity, no guarding and negative Murphy's sign.  Musculoskeletal: Normal range of motion. She exhibits no tenderness.  Neurological: She is alert and oriented to person, place, and time. No cranial nerve deficit.  Skin: Skin is warm and dry.  Psychiatric: She has a normal mood and affect. Her behavior is normal.  Nursing note and vitals reviewed.   ED Course  Procedures (including critical care time) Labs Review Labs Reviewed  COMPREHENSIVE METABOLIC PANEL - Abnormal; Notable for the following:    Potassium 3.4 (*)    Glucose, Bld 113 (*)    All other components within normal limits  URINALYSIS, ROUTINE W REFLEX MICROSCOPIC (NOT AT [facility]) - Abnormal; Notable for the following:    Leukocytes, UA SMALL (*)    All other components within normal limits  URINE MICROSCOPIC-ADD ON - Abnormal; Notable for the following:    Squamous Epithelial / LPF 6-30 (*)    Bacteria, UA FEW (*)    All other components within normal limits  CBC WITH DIFFERENTIAL/PLATELET  LIPASE, BLOOD  POC OCCULT BLOOD, ED    Imaging Review Ct Abdomen Pelvis W Contrast  04/04/2015  CLINICAL DATA:  Abdominal pain.  History of ulcerative colitis. EXAM: CT ABDOMEN AND PELVIS WITH CONTRAST TECHNIQUE: Multidetector CT imaging of the abdomen and pelvis was performed using the standard protocol following bolus administration of intravenous contrast. CONTRAST:  159mL OMNIPAQUE IOHEXOL 300 MG/ML  SOLN COMPARISON:  CT 08/17/2014 FINDINGS: Lower chest: Right lower lobe scarring unchanged. No infiltrate or effusion in the lung bases. Hepatobiliary: Normal liver. Normal gallbladder and bile ducts. Portal vein enhances normally Pancreas:  Negative Spleen: Negative Adrenals/Urinary Tract: Normal kidneys. No renal mass or obstruction. No urinary tract calculi. Urinary bladder normal Stomach/Bowel: Negative for bowel obstruction. No bowel edema. No evidence of colitis. Normal appendix. Moderate amount of stool in the colon. Small bowel normal in caliber. Vascular/Lymphatic: Negative Reproductive: Normal uterus. 15 mm cyst posterior to the uterus. This is unchanged from the prior CT. Other: Negative for hernia. Musculoskeletal: Negative IMPRESSION: No acute abnormality.  Negative for colitis.  Normal appendix. Electronically Signed   By: Franchot Gallo M.D.   On: 04/04/2015 13:55   I have personally reviewed and evaluated these images and lab results as part of my medical decision-making.   EKG Interpretation None      MDM   Final diagnoses:  Epigastric pain   Patient is a 54 year old female with history of ulcerative colitis and prior intraabdominal abscesses presenting with 3 days of severe epigastric pain and dark tarry stools. Physical exam notable for epigastric and LLQ tenderness. FOBT negative. CBC, CMP, lipase, and UA unremarkable. CT abdomen negative. Symptoms modestly improved with with GI cocktail and protonix.  Unclear etiology, though possibly related to peptic ulcer disease. Hemoglobin within normal limits. Presentation may also be due gastroenteritis. Patient able to tolerate PO in the ED. No further episodes of diarrhea or emesis in ED. No evidence for obstruction or other acute intra-abdominal process. Will discharge home with protonix and zofran. Instructed patient to follow up with GI doctor or PCP. Return precautions reviewed.   Vivi Barrack, MD 04/04/15 [place], MD 04/04/15 [phone]

## 2015-07-03 ENCOUNTER — Emergency Department (HOSPITAL_COMMUNITY)
Admission: EM | Admit: 2015-07-03 | Discharge: 2015-07-03 | Disposition: A | Discharge status: Home / Self Care | Payer: BLUE CROSS/BLUE SHIELD | Attending: Emergency Medicine | Admitting: Emergency Medicine

## 2015-07-03 ENCOUNTER — Encounter (HOSPITAL_COMMUNITY): Payer: Self-pay | Admitting: Emergency Medicine

## 2015-07-03 DIAGNOSIS — R300 Dysuria: Secondary | ICD-10-CM | POA: Diagnosis not present

## 2015-07-03 DIAGNOSIS — Z8719 Personal history of other diseases of the digestive system: Secondary | ICD-10-CM | POA: Insufficient documentation

## 2015-07-03 DIAGNOSIS — R1033 Periumbilical pain: Secondary | ICD-10-CM | POA: Insufficient documentation

## 2015-07-03 DIAGNOSIS — R109 Unspecified abdominal pain: Secondary | ICD-10-CM | POA: Diagnosis present

## 2015-07-03 DIAGNOSIS — Z79899 Other long term (current) drug therapy: Secondary | ICD-10-CM | POA: Insufficient documentation

## 2015-07-03 LAB — COMPREHENSIVE METABOLIC PANEL
ALT: 23 U/L (ref 14–54)
AST: 18 U/L (ref 15–41)
Albumin: 4 g/dL (ref 3.5–5.0)
Alkaline Phosphatase: 103 U/L (ref 38–126)
Anion gap: 13 (ref 5–15)
BILIRUBIN TOTAL: 0.3 mg/dL (ref 0.3–1.2)
BUN: 14 mg/dL (ref 6–20)
CALCIUM: 10 mg/dL (ref 8.9–10.3)
CO2: 20 mmol/L — ABNORMAL LOW (ref 22–32)
CREATININE: 0.68 mg/dL (ref 0.44–1.00)
Chloride: 109 mmol/L (ref 101–111)
GFR calc Af Amer: >60 mL/min (ref >60)
Glucose, Bld: 95 mg/dL (ref 65–99)
Potassium: 3.7 mmol/L (ref 3.5–5.1)
SODIUM: 142 mmol/L (ref 135–145)
TOTAL PROTEIN: 7.3 g/dL (ref 6.5–8.1)

## 2015-07-03 LAB — URINALYSIS, ROUTINE W REFLEX MICROSCOPIC
Bilirubin Urine: NEGATIVE
Glucose, UA: NEGATIVE mg/dL
Hgb urine dipstick: NEGATIVE
KETONES UR: NEGATIVE mg/dL
NITRITE: NEGATIVE
PROTEIN: NEGATIVE mg/dL
Specific Gravity, Urine: 1.026 (ref 1.005–1.030)
pH: 5.5 (ref 5.0–8.0)

## 2015-07-03 LAB — CBC
HCT: 40.1 % (ref 36.0–46.0)
Hemoglobin: 13.4 g/dL (ref 12.0–15.0)
MCH: 32 pg (ref 26.0–34.0)
MCHC: 33.4 g/dL (ref 30.0–36.0)
MCV: 95.7 fL (ref 78.0–100.0)
PLATELETS: 249 10*3/uL (ref 150–400)
RBC: 4.19 MIL/uL (ref 3.87–5.11)
RDW: 12.9 % (ref 11.5–15.5)
WBC: 5.9 10*3/uL (ref 4.0–10.5)

## 2015-07-03 LAB — URINE MICROSCOPIC-ADD ON

## 2015-07-03 LAB — LIPASE, BLOOD: Lipase: 30 U/L (ref 11–51)

## 2015-07-03 MED ORDER — CEPHALEXIN 500 MG PO CAPS: 500.0000 mg | ORAL_CAPSULE | Freq: Four times a day (QID) | ORAL | Status: DC

## 2015-07-03 MED ORDER — HYDROCODONE-ACETAMINOPHEN 5-325 MG PO TABS
2.0000 | ORAL_TABLET | Freq: Once | ORAL | Status: AC
  Administered 2015-07-03: 2 via ORAL
  Filled 2015-07-03: qty 2

## 2015-07-03 NOTE — ED Provider Notes (Addendum)
CSN: [id number]     Arrival date & time 07/03/15  1642 History   First MD Initiated Contact with Patient 07/03/15 2034     Chief Complaint  Patient presents with  . Abdominal Pain     (Consider location/radiation/quality/duration/timing/severity/associated sxs/prior Treatment) Patient is a 55 y.o. female presenting with abdominal pain. The history is provided by the patient.  Abdominal Pain Associated symptoms: no chest pain, no chills, no cough, no diarrhea, no dysuria, no fever, no shortness of breath, no sore throat, no vaginal bleeding, no vaginal discharge and no vomiting   Patient c/o mid abdominal pain onset at work today. Acute onset, at rest. Comes and goes, lasts seconds to several minutes. No consistent exacerbating or alleviating factors. Denies vomiting or diarrhea. Denies constipation, had normal bm today.  No dysuria or hematuria.  Pt indicates is postmenopausal.  Pt indicates dx w [facility] in 2010.  Pt denies recent diarrhea. Normal appetite. No wt loss. No fever or chills.       Past Medical History  Diagnosis Date  . Ulcerative colitis    Past Surgical History  Procedure Laterality Date  . Abdominal surgery    . Colon surgery     Family History  Problem Relation Age of Onset  . Hypertension Mother   . Stroke Mother   . Multiple sclerosis Sister    Social History  Substance Use Topics  . Smoking status: Never Smoker   . Smokeless tobacco: Never Used  . Alcohol Use: Yes     Comment: occ   OB History    No data available     Review of Systems  Constitutional: Negative for fever and chills.  HENT: Negative for sore throat.   Eyes: Negative for redness.  Respiratory: Negative for cough and shortness of breath.   Cardiovascular: Negative for chest pain.  Gastrointestinal: Positive for abdominal pain. Negative for vomiting and diarrhea.  Genitourinary: Negative for dysuria, flank pain, vaginal bleeding and vaginal discharge.  Musculoskeletal: Negative for  back pain and neck pain.  Skin: Negative for rash.  Neurological: Negative for headaches.  Hematological: Does not bruise/bleed easily.  Psychiatric/Behavioral: Negative for confusion.      Allergies  Flagyl and Metoclopramide hcl  Home Medications   Prior to Admission medications   Medication Sig Start Date End Date Taking? Authorizing Provider  docusate sodium (COLACE) 50 MG capsule Take 1 capsule (50 mg total) by mouth 2 (two) times daily. Patient not taking: Reported on 08/17/2014 05/13/13   Ernestina Patches, MD  HYDROcodone-acetaminophen (NORCO/VICODIN) 5-325 MG per tablet Take 1-2 tablets by mouth every 4 (four) hours as needed. Patient not taking: Reported on 08/17/2014 03/13/14   Carman Ching, PA-C  naproxen sodium (ANAPROX) 220 MG tablet Take 220 mg by mouth 2 (two) times daily as needed (for pain).    Historical Provider, MD  omeprazole (PRILOSEC) 20 MG capsule Take 1 capsule (20 mg total) by mouth daily. 08/17/14   Waynetta Pean, PA-C  ondansetron (ZOFRAN ODT) 4 MG disintegrating tablet Take 1 tablet (4 mg total) by mouth every 8 (eight) hours as needed for nausea or vomiting. 04/04/15   Vivi Barrack, MD  ondansetron (ZOFRAN) 4 MG tablet Take 1 tablet (4 mg total) by mouth every 6 (six) hours. Patient not taking: Reported on 08/17/2014 05/13/13   Ernestina Patches, MD  ondansetron (ZOFRAN-ODT) 8 MG disintegrating tablet Take 1 tablet (8 mg total) by mouth once. 04/04/15   Vivi Barrack, MD   BP 145/67  mmHg  Pulse 75  Temp(Src) 98 F (36.7 C) (Oral)  Resp 13  SpO2 98%  LMP 05/12/2013 Physical Exam  Constitutional: She appears well-developed and well-nourished. No distress.  HENT:  Mouth/Throat: Oropharynx is clear and moist.  Eyes: Conjunctivae are normal. No scleral icterus.  Neck: Neck supple. No tracheal deviation present.  Cardiovascular: Normal rate, regular rhythm, normal heart sounds and intact distal pulses.  Exam reveals no gallop and no friction rub.   No murmur  heard. Pulmonary/Chest: Effort normal and breath sounds normal. No respiratory distress.  Abdominal: Soft. Normal appearance and bowel sounds are normal. She exhibits no distension and no mass. There is no tenderness. There is no rebound and no guarding.  Genitourinary:  No cva tenderness.  Rectal, no blood, no mass felt, stool light brown, heme neg (checked by self)  Musculoskeletal: She exhibits no edema.  Neurological: She is alert.  Skin: Skin is warm and dry. No rash noted. She is not diaphoretic.  Psychiatric: She has a normal mood and affect.  Nursing note and vitals reviewed.   ED Course  Procedures (including critical care time) Labs Review  Results for orders placed or performed during the hospital encounter of 07/03/15  Lipase, blood  Result Value Ref Range   Lipase 30 11 - 51 U/L  Comprehensive metabolic panel  Result Value Ref Range   Sodium 142 135 - 145 mmol/L   Potassium 3.7 3.5 - 5.1 mmol/L   Chloride 109 101 - 111 mmol/L   CO2 20 (L) 22 - 32 mmol/L   Glucose, Bld 95 65 - 99 mg/dL   BUN 14 6 - 20 mg/dL   Creatinine, Ser 0.68 0.44 - 1.00 mg/dL   Calcium 10.0 8.9 - 10.3 mg/dL   Total Protein 7.3 6.5 - 8.1 g/dL   Albumin 4.0 3.5 - 5.0 g/dL   AST 18 15 - 41 U/L   ALT 23 14 - 54 U/L   Alkaline Phosphatase 103 38 - 126 U/L   Total Bilirubin 0.3 0.3 - 1.2 mg/dL   GFR calc non Af Amer >60 >60 mL/min   GFR calc Af Amer >60 >60 mL/min   Anion gap 13 5 - 15  CBC  Result Value Ref Range   WBC 5.9 4.0 - 10.5 K/uL   RBC 4.19 3.87 - 5.11 MIL/uL   Hemoglobin 13.4 12.0 - 15.0 g/dL   HCT 40.1 36.0 - 46.0 %   MCV 95.7 78.0 - 100.0 fL   MCH 32.0 26.0 - 34.0 pg   MCHC 33.4 30.0 - 36.0 g/dL   RDW 12.9 11.5 - 15.5 %   Platelets 249 150 - 400 K/uL  Urinalysis, Routine w reflex microscopic (not at [facility])  Result Value Ref Range   Color, Urine YELLOW YELLOW   APPearance CLEAR CLEAR   Specific Gravity, Urine 1.026 1.005 - 1.030   pH 5.5 5.0 - 8.0   Glucose, UA NEGATIVE  NEGATIVE mg/dL   Hgb urine dipstick NEGATIVE NEGATIVE   Bilirubin Urine NEGATIVE NEGATIVE   Ketones, ur NEGATIVE NEGATIVE mg/dL   Protein, ur NEGATIVE NEGATIVE mg/dL   Nitrite NEGATIVE NEGATIVE   Leukocytes, UA TRACE (A) NEGATIVE  Urine microscopic-add on  Result Value Ref Range   Squamous Epithelial / LPF 0-5 (A) NONE SEEN   WBC, UA 6-30 0 - 5 WBC/hpf   RBC / HPF 0-5 0 - 5 RBC/hpf   Bacteria, UA MANY (A) NONE SEEN   Urine-Other YEAST PRESENT  I have personally reviewed and evaluated these lab results as part of my medical decision-making.    MDM   Labs.  Reviewed nursing notes and prior charts for additional history.   Pt requests pain med. Hydrocodone po.  abd is soft nt.    Labs unremarkable.  On review prior charts, last several cts abd neg for acute process.  Pain intermittent by hx and non tender exam.  On recheck, pt does note mild dysuria. No cva tenderness. abd remains soft nt.  ua w many bact and 5-20 wbc - will rx.   Pt currently appears stable for d/c.      Lajean Saver, MD 07/03/15 2244

## 2015-07-03 NOTE — Discharge Instructions (Signed)
It was our pleasure to provide your ER care today - we hope that you feel better.  Continue to take your acid blocker medication.  The lab tests show a possible urine infection - take antibiotic as prescribed.   Take tylenol as need.   Follow up with primary care doctor in the next 1-2 days for recheck if symptoms fail to improve/resolve.  Return to ER if worse, new symptoms, fevers, persistent vomiting, worsening or severe pain, other concern.  You were given pain medication in the ER - no driving for the next 4 hours.    Abdominal Pain, Adult Many things can cause abdominal pain. Usually, abdominal pain is not caused by a disease and will improve without treatment. It can often be observed and treated at home. Your health care provider will do a physical exam and possibly order blood tests and X-rays to help determine the seriousness of your pain. However, in many cases, more time must pass before a clear cause of the pain can be found. Before that point, your health care provider may not know if you need more testing or further treatment. HOME CARE INSTRUCTIONS Monitor your abdominal pain for any changes. The following actions may help to alleviate any discomfort you are experiencing:  Only take over-the-counter or prescription medicines as directed by your health care provider.  Do not take laxatives unless directed to do so by your health care provider.  Try a clear liquid diet (broth, tea, or water) as directed by your health care provider. Slowly move to a bland diet as tolerated. SEEK MEDICAL CARE IF:  You have unexplained abdominal pain.  You have abdominal pain associated with nausea or diarrhea.  You have pain when you urinate or have a bowel movement.  You experience abdominal pain that wakes you in the night.  You have abdominal pain that is worsened or improved by eating food.  You have abdominal pain that is worsened with eating fatty foods.  You have a  fever. SEEK IMMEDIATE MEDICAL CARE IF:  Your pain does not go away within 2 hours.  You keep throwing up (vomiting).  Your pain is felt only in portions of the abdomen, such as the right side or the left lower portion of the abdomen.  You pass bloody or black tarry stools. MAKE SURE YOU:  Understand these instructions.  Will watch your condition.  Will get help right away if you are not doing well or get worse.   This information is not intended to replace advice given to you by your health care provider. Make sure you discuss any questions you have with your health care provider.   Document Released: 01/30/2005 Document Revised: 01/11/2015 Document Reviewed: 12/30/2012 Elsevier Interactive Patient Education [employer].

## 2015-07-03 NOTE — ED Notes (Signed)
Patient left at this time with all belongings. 

## 2015-07-03 NOTE — ED Notes (Addendum)
Pt sts RLQ and upper abd pain starting today

## 2015-11-27 ENCOUNTER — Emergency Department (HOSPITAL_COMMUNITY)
Admission: EM | Admit: 2015-11-27 | Discharge: 2015-11-27 | Disposition: A | Discharge status: Home / Self Care | Payer: BLUE CROSS/BLUE SHIELD | Attending: Emergency Medicine | Admitting: Emergency Medicine

## 2015-11-27 ENCOUNTER — Encounter (HOSPITAL_COMMUNITY): Payer: Self-pay | Admitting: Emergency Medicine

## 2015-11-27 DIAGNOSIS — Z79899 Other long term (current) drug therapy: Secondary | ICD-10-CM | POA: Diagnosis not present

## 2015-11-27 DIAGNOSIS — H5711 Ocular pain, right eye: Secondary | ICD-10-CM

## 2015-11-27 DIAGNOSIS — Z7982 Long term (current) use of aspirin: Secondary | ICD-10-CM | POA: Diagnosis not present

## 2015-11-27 DIAGNOSIS — Z792 Long term (current) use of antibiotics: Secondary | ICD-10-CM | POA: Insufficient documentation

## 2015-11-27 MED ORDER — TETRACAINE HCL 0.5 % OP SOLN
2.0000 [drp] | Freq: Once | OPHTHALMIC | Status: AC
  Administered 2015-11-27: 2 [drp] via OPHTHALMIC
  Filled 2015-11-27: qty 4

## 2015-11-27 MED ORDER — FLUORESCEIN SODIUM 1 MG OP STRP
1.0000 | ORAL_STRIP | Freq: Once | OPHTHALMIC | Status: AC
  Administered 2015-11-27: 1 via OPHTHALMIC
  Filled 2015-11-27: qty 1

## 2015-11-27 NOTE — ED Provider Notes (Signed)
[facility] DEPT Provider Note   CSN: [id number] Arrival date & time: 11/27/15  [id number]  First Provider Contact:  None       History   Chief Complaint Chief Complaint  Patient presents with  . Eye Color Change  . Migraine    HPI Crystal Benton is a 55 y.o. female.  HPI Complains of right-sided eye pain and eye redness gradual onset yesterday. Pain not exacerbated or improved by anything. Pain is moderate. No treatment prior to coming here. She also has pain at right temporal area. No other associated symptoms. No nausea or vomiting. She does not feel ill. Past Medical History:  Diagnosis Date  . Ulcerative colitis     Patient Active Problem List   Diagnosis Date Noted  . UNSPECIFIED ANEMIA 01/24/2009  . IBD 01/24/2009    Past Surgical History:  Procedure Laterality Date  . ABDOMINAL SURGERY    . COLON SURGERY      OB History    No data available       Home Medications    Prior to Admission medications   Medication Sig Start Date End Date Taking? Authorizing Provider  Aspirin-Salicylamide-Caffeine (BC HEADACHE POWDER PO) Take 1 packet by mouth daily as needed.    Historical Provider, MD  cephALEXin (KEFLEX) 500 MG capsule Take 1 capsule (500 mg total) by mouth 4 (four) times daily. 07/03/15   Lajean Saver, MD  omeprazole (PRILOSEC) 20 MG capsule Take 1 capsule (20 mg total) by mouth daily. 08/17/14   Waynetta Pean, PA-C    Family History Family History  Problem Relation Age of Onset  . Hypertension Mother   . Stroke Mother   . Multiple sclerosis Sister     Social History Social History  Substance Use Topics  . Smoking status: Never Smoker  . Smokeless tobacco: Never Used  . Alcohol use Yes     Comment: occ     Allergies   Onion; Flagyl [metronidazole]; and Metoclopramide hcl   Review of Systems Review of Systems  Constitutional: Negative.   Eyes: Positive for pain and redness.  Respiratory: Negative.   Cardiovascular: Negative.     Gastrointestinal: Negative.   Musculoskeletal: Negative.   Skin: Negative.   Neurological: Positive for headaches.  Psychiatric/Behavioral: Negative.   All other systems reviewed and are negative.    Physical Exam Updated Vital Signs BP 123/67 (BP Location: Right Arm)   Pulse 85   Temp 98 F (36.7 C) (Oral)   Resp 17   LMP 05/12/2013   SpO2 100%   Physical Exam  Constitutional: She is oriented to person, place, and time. She appears well-developed and well-nourished. No distress.  HENT:  Right Ear: External ear normal.  Left Ear: External ear normal.  Nose: Nose normal.  Eyes: EOM are normal. Pupils are equal, round, and reactive to light. Right conjunctiva is injected. Right conjunctiva has no hemorrhage. Left conjunctiva is not injected. Left conjunctiva has no hemorrhage. Right eye exhibits normal extraocular motion.  Neck: Normal range of motion. Neck supple.  Cardiovascular: Normal rate.   Pulmonary/Chest: Effort normal.  Abdominal:  Obese  Neurological: She is alert and oriented to person, place, and time. Coordination normal.  Psychiatric: She has a normal mood and affect.   Right eye fluorescein negative intraocular pressure 20 measure with Tono-Pen left eye intraocular pressure 18  ED Treatments / Results  Labs (all labs ordered are listed, but only abnormal results are displayed) Labs Reviewed - No data to display  EKG  EKG Interpretation None       Radiology No results found.  Procedures Procedures (including critical care time)  Medications Ordered in ED Medications - No data to display Dr Alanda Slim from pathology consulted by telephone  Initial Impression / Assessment and Plan / ED Course  I have reviewed the triage vital signs and the nursing notes.  Pertinent labs & imaging results that were available during my care of the patient were reviewed by me and considered in my medical decision making (see chart for details).  Clinical Course    Patient declines pain medicine   Final Clinical Impressions(s) / ED Diagnoses  Dr. Alanda Slim see patient immediately upon leaving from here. Likely diagnoses iritis Final diagnoses:  None  Dx acute right eye pain and redness  New Prescriptions New Prescriptions   No medications on file     Orlie Dakin, MD 11/27/15 1212

## 2015-11-27 NOTE — ED Triage Notes (Signed)
Patient c/o right eye reddened and pain that radiates to posterior head on right side.  Patient states that she has blurred vision when she looks down. Patient denies any drainage or crust, states this is different when she had pink eye.

## 2015-11-30 ENCOUNTER — Emergency Department (HOSPITAL_COMMUNITY)
Admission: EM | Admit: 2015-11-30 | Discharge: 2015-11-30 | Disposition: A | Discharge status: Home / Self Care | Payer: BLUE CROSS/BLUE SHIELD | Attending: Emergency Medicine | Admitting: Emergency Medicine

## 2015-11-30 ENCOUNTER — Encounter (HOSPITAL_COMMUNITY): Payer: Self-pay | Admitting: Emergency Medicine

## 2015-11-30 DIAGNOSIS — H169 Unspecified keratitis: Secondary | ICD-10-CM | POA: Insufficient documentation

## 2015-11-30 MED ORDER — OXYCODONE-ACETAMINOPHEN 5-325 MG PO TABS: 1.0000 | ORAL_TABLET | ORAL | 0 refills | Status: DC | PRN

## 2015-11-30 MED ORDER — CYCLOPENTOLATE HCL 1 % OP SOLN
1.0000 [drp] | Freq: Once | OPHTHALMIC | Status: AC
  Administered 2015-11-30: 1 [drp] via OPHTHALMIC
  Filled 2015-11-30: qty 2

## 2015-11-30 MED ORDER — TETRACAINE HCL 0.5 % OP SOLN
2.0000 [drp] | Freq: Once | OPHTHALMIC | Status: AC
  Administered 2015-11-30: 2 [drp] via OPHTHALMIC
  Filled 2015-11-30: qty 4

## 2015-11-30 MED ORDER — FLUORESCEIN SODIUM 1 MG OP STRP
1.0000 | ORAL_STRIP | Freq: Once | OPHTHALMIC | Status: AC
  Administered 2015-11-30: 1 via OPHTHALMIC
  Filled 2015-11-30: qty 1

## 2015-11-30 NOTE — Discharge Instructions (Signed)
See the ophthalmologist later today. He will give you further instructions.

## 2015-11-30 NOTE — ED Provider Notes (Signed)
[facility] DEPT Provider Note   CSN: [id number] Arrival date & time: 11/30/15  8366  First Provider Contact:  First MD Initiated Contact with Patient 11/30/15 [phone]        History   Chief Complaint Chief Complaint  Patient presents with  . Eye Pain    HPI Crystal Benton is a 55 y.o. female.  The history is provided by the patient.  55 year old female states that she woke up 3 days ago with her right eye red and pain in the eye. She was seen in the ED here and diagnosed with eye pain. Since then, pain has gotten worse. There is a very mild foreign body sensation. Pain is worse with exposure to light. She rates pain at 9/10. There has been no visual change. She denies fever or chills. She denies any trauma to the eye.  Past Medical History:  Diagnosis Date  . Ulcerative colitis     Patient Active Problem List   Diagnosis Date Noted  . UNSPECIFIED ANEMIA 01/24/2009  . IBD 01/24/2009    Past Surgical History:  Procedure Laterality Date  . ABDOMINAL SURGERY    . COLON SURGERY      OB History    No data available       Home Medications    Prior to Admission medications   Medication Sig Start Date End Date Taking? Authorizing Provider  Aspirin-Salicylamide-Caffeine (BC HEADACHE POWDER PO) Take 1 packet by mouth daily as needed.    Historical Provider, MD  cephALEXin (KEFLEX) 500 MG capsule Take 1 capsule (500 mg total) by mouth 4 (four) times daily. 07/03/15   Lajean Saver, MD  omeprazole (PRILOSEC) 20 MG capsule Take 1 capsule (20 mg total) by mouth daily. 08/17/14   Waynetta Pean, PA-C    Family History Family History  Problem Relation Age of Onset  . Hypertension Mother   . Stroke Mother   . Multiple sclerosis Sister     Social History Social History  Substance Use Topics  . Smoking status: Never Smoker  . Smokeless tobacco: Never Used  . Alcohol use Yes     Comment: occ     Allergies   Onion; Flagyl [metronidazole]; and Metoclopramide  hcl   Review of Systems Review of Systems  All other systems reviewed and are negative.    Physical Exam Updated Vital Signs BP 147/60 (BP Location: Left Arm)   Pulse 82   Temp 98.1 F (36.7 C) (Oral)   Resp 16   LMP 05/12/2013   SpO2 100%   Physical Exam  Nursing note and vitals reviewed.  55 year old female, resting comfortably and in no acute distress. Vital signs are significant for mild hypertension. Oxygen saturation is 100%, which is normal. Head is normocephalic and atraumatic. PERRLA, EOMI. there is moderate erythema of the conjunctiva of the right eye. Anterior chambers clear. There is photophobia with both direct and consensual light exposure. No preauricular lymph node is palpable. Lids are everted and no foreign bodies are seen in the conjunctival sacs. Oropharynx is clear. Neck is nontender and supple without adenopathy or JVD. Back is nontender and there is no CVA tenderness. Lungs are clear without rales, wheezes, or rhonchi. Chest is nontender. Heart has regular rate and rhythm without murmur. Abdomen is soft, flat, nontender without masses or hepatosplenomegaly and peristalsis is normoactive. Extremities have no cyanosis or edema, full range of motion is present. Skin is warm and dry without rash. Neurologic: Mental status is normal, cranial nerves are  intact, there are no motor or sensory deficits.   ED Treatments / Results   Procedures Procedures (including critical care time) Slit-lamp exam: No corneal foreign bodies. Anterior chambers clear without cells. Eye stained with fluorescein and examined with cobalt blue filter, and there are numerous areas of increased uptake noted. No definite dendritic lesions, but there are clearly some areas of increased uptake with a linear pattern.  Ocular pressure as measured by Tono-Pen was 27.  Medications Ordered in ED Medications  cyclopentolate (CYCLODRYL,CYCLOGYL) 1 % ophthalmic solution 1 drop (1 drop Right  Eye Given 11/30/15 0512)  fluorescein ophthalmic strip 1 strip (1 strip Right Eye Given 11/30/15 0511)  tetracaine (PONTOCAINE) 0.5 % ophthalmic solution 2 drop (2 drops Right Eye Given 11/30/15 0511)     Initial Impression / Assessment and Plan / ED Course  I have reviewed the triage vital signs and the nursing notes.  Pertinent labs & imaging results that were available during my care of the patient were reviewed by me and considered in my medical decision making (see chart for details).  Clinical Course    Right eye pain and erythema With multiple areas of increased uptake. This is not particularly typical of epidemic keratoconjunctivitis. She was unable to open her right eye for visual acuity initially. After topical anesthesia, visual acuity is repeated and was 20/30 in the right eye, 20/40 in the left eye. 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 She will be started on cyclopentolate drops and referred to ophthalmology for follow-up later today.I have discussed the case with Dr. Alanda Slim who agrees to see her later today. She is given a prescription for oxycodone-acetaminophen, and given work release for the next 2 days.  Final Clinical Impressions(s) / ED Diagnoses   Final diagnoses:  Keratitis    New Prescriptions New Prescriptions   OXYCODONE-ACETAMINOPHEN (PERCOCET) 5-325 MG TABLET    Take 1 tablet by mouth every 4 (four) hours as needed for moderate pain.     Delora Fuel, MD 24/26/83 [id number]

## 2015-11-30 NOTE — ED Triage Notes (Signed)
Pt c/o rt eye pain.  Was here on Monday for same and states she was not given anything to take at home.  Here for continued and increasing pain in that eye.

## 2016-09-09 ENCOUNTER — Encounter (HOSPITAL_COMMUNITY): Payer: Self-pay | Admitting: *Deleted

## 2016-09-09 ENCOUNTER — Emergency Department (HOSPITAL_COMMUNITY): Payer: BLUE CROSS/BLUE SHIELD

## 2016-09-09 ENCOUNTER — Emergency Department (HOSPITAL_COMMUNITY)
Admission: EM | Admit: 2016-09-09 | Discharge: 2016-09-09 | Disposition: A | Discharge status: Home / Self Care | Payer: BLUE CROSS/BLUE SHIELD | Attending: Emergency Medicine | Admitting: Emergency Medicine

## 2016-09-09 DIAGNOSIS — R05 Cough: Secondary | ICD-10-CM | POA: Insufficient documentation

## 2016-09-09 DIAGNOSIS — R52 Pain, unspecified: Secondary | ICD-10-CM | POA: Insufficient documentation

## 2016-09-09 DIAGNOSIS — Z7982 Long term (current) use of aspirin: Secondary | ICD-10-CM | POA: Insufficient documentation

## 2016-09-09 DIAGNOSIS — R6889 Other general symptoms and signs: Secondary | ICD-10-CM

## 2016-09-09 MED ORDER — KETOROLAC TROMETHAMINE 60 MG/2ML IM SOLN
30.0000 mg | Freq: Once | INTRAMUSCULAR | Status: AC
  Administered 2016-09-09: 30 mg via INTRAMUSCULAR
  Filled 2016-09-09: qty 2

## 2016-09-09 MED ORDER — OXYCODONE-ACETAMINOPHEN 5-325 MG PO TABS
1.0000 | ORAL_TABLET | Freq: Once | ORAL | Status: AC
  Administered 2016-09-09: 1 via ORAL
  Filled 2016-09-09: qty 1

## 2016-09-09 NOTE — ED Provider Notes (Signed)
[facility] DEPT Provider Note   CSN: [id number] Arrival date & time: 09/09/16  2025  By signing my name below, I, Sonum Patel, attest that this documentation has been prepared under the direction and in the presence of Virgel Manifold, MD. Electronically Signed: Sonum Patel, Education administrator. 09/09/16. 11:47 AM.  History   Chief Complaint Chief Complaint  Patient presents with  . Cough  . Generalized Body Aches    The history is provided by the patient. No language interpreter was used.     HPI Comments: Crystal Benton is a 56 y.o. female who presents to the Emergency Department complaining of a persistent cough for the past few days. She states her symptoms initially started as a sore throat and right eye HA 3 days ago. She now complains of associated generalized myalgias that began last night. She has tried Benadryl and cough drops without relief. She denies vision changes, fever, dysuria.   Past Medical History:  Diagnosis Date  . Ulcerative colitis     Patient Active Problem List   Diagnosis Date Noted  . UNSPECIFIED ANEMIA 01/24/2009  . IBD 01/24/2009    Past Surgical History:  Procedure Laterality Date  . ABDOMINAL SURGERY    . COLON SURGERY      OB History    No data available       Home Medications    Prior to Admission medications   Medication Sig Start Date End Date Taking? Authorizing Provider  Aspirin-Salicylamide-Caffeine (BC HEADACHE POWDER PO) Take 1 packet by mouth daily as needed.    [provider]  cephALEXin (KEFLEX) 500 MG capsule Take 1 capsule (500 mg total) by mouth 4 (four) times daily. 07/03/15   Lajean Saver, MD  omeprazole (PRILOSEC) 20 MG capsule Take 1 capsule (20 mg total) by mouth daily. 08/17/14   Waynetta Pean, PA-C  oxyCODONE-acetaminophen (PERCOCET) 5-325 MG tablet Take 1 tablet by mouth every 4 (four) hours as needed for moderate pain. 08/30/04   Delora Fuel, MD    Family History Family History  Problem Relation Age of Onset    . Hypertension Mother   . Stroke Mother   . Multiple sclerosis Sister     Social History Social History  Substance Use Topics  . Smoking status: Never Smoker  . Smokeless tobacco: Never Used  . Alcohol use Yes     Comment: occ     Allergies   Onion; Flagyl [metronidazole]; and Metoclopramide hcl   Review of Systems Review of Systems  All other systems reviewed and are negative for acute change except as noted in the HPI.   Physical Exam Updated Vital Signs BP 117/73 (BP Location: Right Arm)   Pulse 82   Temp 99.3 F (37.4 C) (Oral)   Resp 18   LMP 05/12/2013   SpO2 100%   Physical Exam  Constitutional: She is oriented to person, place, and time. She appears well-developed and well-nourished. No distress.  HENT:  Head: Normocephalic and atraumatic.  Mouth/Throat: Oropharynx is clear and moist. No oropharyngeal exudate.  Eyes: EOM are normal.  Neck: Normal range of motion. Neck supple.  Cardiovascular: Normal rate, regular rhythm and normal heart sounds.   Pulmonary/Chest: Effort normal and breath sounds normal. No respiratory distress. She has no wheezes. She has no rales.  Abdominal: Soft. She exhibits no distension. There is no tenderness.  Musculoskeletal: Normal range of motion.  Neurological: She is alert and oriented to person, place, and time.  Skin: Skin is warm and dry.  Psychiatric:  She has a normal mood and affect. Judgment normal.  Nursing note and vitals reviewed.    ED Treatments / Results  DIAGNOSTIC STUDIES: Oxygen Saturation is 100% on RA, normal by my interpretation.    COORDINATION OF CARE: 11:46 AM Discussed treatment plan with pt at bedside and pt agreed to plan.   Labs (all labs ordered are listed, but only abnormal results are displayed) Labs Reviewed - No data to display  EKG  EKG Interpretation None       Radiology Dg Chest 2 View  Result Date: 09/09/2016 CLINICAL DATA:  Productive cough, cold and chills since  yesterday. EXAM: CHEST  2 VIEW COMPARISON:  PA and lateral chest 03/13/2014. FINDINGS: The heart size and mediastinal contours are within normal limits. Both lungs are clear. The visualized skeletal structures are unremarkable. IMPRESSION: No active cardiopulmonary disease. Electronically Signed   By: Inge Rise M.D.   On: 09/09/2016 11:15    Procedures Procedures (including critical care time)  Medications Ordered in ED Medications - No data to display   Initial Impression / Assessment and Plan / ED Course  I have reviewed the triage vital signs and the nursing notes.  Pertinent labs & imaging results that were available during my care of the patient were reviewed by me and considered in my medical decision making (see chart for details).     18yF with likely viral illness. CXR fine. Doubt SBI, PE or other emergent process.   Final Clinical Impressions(s) / ED Diagnoses   Final diagnoses:  Flu-like symptoms    New Prescriptions New Prescriptions   No medications on file   I personally preformed the services scribed in my presence. The recorded information has been reviewed is accurate. Virgel Manifold, MD.    Virgel Manifold, MD 09/15/16 [phone]

## 2016-09-09 NOTE — ED Triage Notes (Signed)
Pt is here with coughing and her body is so sore with excruciating headache.  Started yesterday.  Pt states yellow and green sputum

## 2017-03-14 ENCOUNTER — Encounter (HOSPITAL_COMMUNITY): Payer: Self-pay | Admitting: *Deleted

## 2017-03-14 ENCOUNTER — Emergency Department (HOSPITAL_COMMUNITY)
Admission: EM | Admit: 2017-03-14 | Discharge: 2017-03-14 | Disposition: A | Discharge status: Home / Self Care | Payer: BLUE CROSS/BLUE SHIELD | Attending: Emergency Medicine | Admitting: Emergency Medicine

## 2017-03-14 ENCOUNTER — Other Ambulatory Visit: Payer: Self-pay

## 2017-03-14 ENCOUNTER — Emergency Department (HOSPITAL_COMMUNITY): Payer: BLUE CROSS/BLUE SHIELD

## 2017-03-14 DIAGNOSIS — K5180 Other ulcerative colitis without complications: Secondary | ICD-10-CM | POA: Diagnosis not present

## 2017-03-14 DIAGNOSIS — K5190 Ulcerative colitis, unspecified, without complications: Secondary | ICD-10-CM

## 2017-03-14 DIAGNOSIS — R1013 Epigastric pain: Secondary | ICD-10-CM | POA: Diagnosis present

## 2017-03-14 LAB — URINALYSIS, ROUTINE W REFLEX MICROSCOPIC
Bilirubin Urine: NEGATIVE
GLUCOSE, UA: NEGATIVE mg/dL
HGB URINE DIPSTICK: NEGATIVE
KETONES UR: NEGATIVE mg/dL
Leukocytes, UA: NEGATIVE
Nitrite: NEGATIVE
PROTEIN: NEGATIVE mg/dL
Specific Gravity, Urine: 1.013 (ref 1.005–1.030)
pH: 7 (ref 5.0–8.0)

## 2017-03-14 LAB — CBC
HCT: 39.9 % (ref 36.0–46.0)
Hemoglobin: 13.4 g/dL (ref 12.0–15.0)
MCH: 31.8 pg (ref 26.0–34.0)
MCHC: 33.6 g/dL (ref 30.0–36.0)
MCV: 94.5 fL (ref 78.0–100.0)
PLATELETS: 271 10*3/uL (ref 150–400)
RBC: 4.22 MIL/uL (ref 3.87–5.11)
RDW: 12.6 % (ref 11.5–15.5)
WBC: 5.5 10*3/uL (ref 4.0–10.5)

## 2017-03-14 LAB — LIPASE, BLOOD: LIPASE: 42 U/L (ref 11–51)

## 2017-03-14 LAB — COMPREHENSIVE METABOLIC PANEL
ALT: 18 U/L (ref 14–54)
AST: 20 U/L (ref 15–41)
Albumin: 4 g/dL (ref 3.5–5.0)
Alkaline Phosphatase: 90 U/L (ref 38–126)
Anion gap: 9 (ref 5–15)
BUN: 16 mg/dL (ref 6–20)
CHLORIDE: 106 mmol/L (ref 101–111)
CO2: 23 mmol/L (ref 22–32)
CREATININE: 0.76 mg/dL (ref 0.44–1.00)
Calcium: 9.4 mg/dL (ref 8.9–10.3)
GFR calc Af Amer: >60 mL/min (ref >60)
Glucose, Bld: 106 mg/dL — ABNORMAL HIGH (ref 65–99)
Potassium: 3.7 mmol/L (ref 3.5–5.1)
SODIUM: 138 mmol/L (ref 135–145)
Total Bilirubin: 0.7 mg/dL (ref 0.3–1.2)
Total Protein: 6.9 g/dL (ref 6.5–8.1)

## 2017-03-14 LAB — I-STAT TROPONIN, ED: Troponin i, poc: 0 ng/mL (ref 0.00–0.08)

## 2017-03-14 LAB — POC OCCULT BLOOD, ED: Fecal Occult Bld: POSITIVE — AB

## 2017-03-14 MED ORDER — SODIUM CHLORIDE 0.9 % IV BOLUS (SEPSIS)
1000.0000 mL | Freq: Once | INTRAVENOUS | Status: AC
  Administered 2017-03-14: 1000 mL via INTRAVENOUS

## 2017-03-14 MED ORDER — GI COCKTAIL ~~LOC~~
30.0000 mL | Freq: Once | ORAL | Status: AC
  Administered 2017-03-14: 30 mL via ORAL
  Filled 2017-03-14: qty 30

## 2017-03-14 MED ORDER — HYDROMORPHONE HCL 1 MG/ML IJ SOLN
1.0000 mg | Freq: Once | INTRAMUSCULAR | Status: AC
  Administered 2017-03-14: 1 mg via INTRAVENOUS
  Filled 2017-03-14: qty 1

## 2017-03-14 MED ORDER — MORPHINE SULFATE (PF) 4 MG/ML IV SOLN
4.0000 mg | Freq: Once | INTRAVENOUS | Status: AC
  Administered 2017-03-14: 4 mg via INTRAVENOUS
  Filled 2017-03-14: qty 1

## 2017-03-14 MED ORDER — ONDANSETRON HCL 4 MG/2ML IJ SOLN
4.0000 mg | Freq: Once | INTRAMUSCULAR | Status: AC
Start: 2017-03-14 — End: 2017-03-14
  Administered 2017-03-14: 4 mg via INTRAVENOUS
  Filled 2017-03-14: qty 2

## 2017-03-14 MED ORDER — FAMOTIDINE 20 MG PO TABS
40.0000 mg | ORAL_TABLET | Freq: Once | ORAL | Status: AC
  Administered 2017-03-14: 40 mg via ORAL
  Filled 2017-03-14: qty 2

## 2017-03-14 MED ORDER — IOPAMIDOL (ISOVUE-300) INJECTION 61%
INTRAVENOUS | Status: AC
  Filled 2017-03-14: qty 100

## 2017-03-14 MED ORDER — ONDANSETRON 4 MG PO TBDP: 4.0000 mg | ORAL_TABLET | Freq: Three times a day (TID) | ORAL | 0 refills | Status: DC | PRN

## 2017-03-14 MED ORDER — PREDNISONE 10 MG PO TABS: 40.0000 mg | ORAL_TABLET | Freq: Every day | ORAL | 0 refills | Status: AC

## 2017-03-14 MED ORDER — AMOXICILLIN-POT CLAVULANATE 875-125 MG PO TABS: 1.0000 | ORAL_TABLET | Freq: Two times a day (BID) | ORAL | 0 refills | Status: DC

## 2017-03-14 NOTE — ED Notes (Signed)
Patient transported to CT scan . 

## 2017-03-14 NOTE — ED Provider Notes (Signed)
[facility] EMERGENCY DEPARTMENT Provider Note   CSN: [id number] Arrival date & time: 03/14/17  0445   History   Chief Complaint Chief Complaint  Patient presents with  . Abdominal Pain    HPI Crystal Benton is a 56 y.o. female with h/o ulcerative colitis c/b abdominal abscess presents to ED for evaluation of gradually worsening, constant, non radiating epigastric abdominal 10/10 pain associated with indigestion, mild nausea and chills x 3 days. Indigestion has been present for 1 week. Also states every time she urinates a small amount of darker, looser stool leaks out. No frank blood in stool. Due to change in insurance pt has not seen GI or taken UC maintenance medications for many years. She denies fevers, chest pain, shortness of breath, vomiting, dysuria. States her pain today is similar to previous UC flares. Took tums and advil without resolution of pain.  Does not use any enemas or suppositories. Has 1 glass of wine 1 week ago, but denies heavy ETOH or NSAIDs use. No h/o PUD, gastritis, pancreatitis, previous abdominal surgeries.   HPI  Past Medical History:  Diagnosis Date  . Ulcerative colitis     Patient Active Problem List   Diagnosis Date Noted  . UNSPECIFIED ANEMIA 01/24/2009  . IBD 01/24/2009    Past Surgical History:  Procedure Laterality Date  . ABDOMINAL SURGERY    . COLON SURGERY      OB History    No data available       Home Medications    Prior to Admission medications   Medication Sig Start Date End Date Taking? Authorizing Provider  naproxen sodium (ALEVE) 220 MG tablet Take 440 mg daily as needed by mouth (pain).   Yes [provider]    Family History Family History  Problem Relation Age of Onset  . Hypertension Mother   . Stroke Mother   . Multiple sclerosis Sister     Social History Social History   Tobacco Use  . Smoking status: Never Smoker  . Smokeless tobacco: Never Used  Substance Use Topics  .  Alcohol use: Yes    Comment: occ  . Drug use: No     Allergies   Onion; Flagyl [metronidazole]; and Metoclopramide hcl   Review of Systems Review of Systems  Constitutional: Positive for appetite change and chills. Negative for fever.  Respiratory: Negative for cough, chest tightness and shortness of breath.   Cardiovascular: Negative for chest pain.  Gastrointestinal: Positive for abdominal pain and nausea. Negative for rectal pain.       +darker looser stool  Genitourinary: Negative for difficulty urinating and dysuria.  Musculoskeletal: Negative for back pain.  Skin: Negative for rash.     Physical Exam Updated Vital Signs BP (!) 144/63 (BP Location: Right Arm)   Pulse 70   Temp 97.8 F (36.6 C) (Oral)   Resp 18   LMP 05/12/2013   SpO2 99%   Physical Exam  Constitutional: She is oriented to person, place, and time. She appears well-developed and well-nourished. No distress.  HENT:  Head: Normocephalic and atraumatic.  Nose: Nose normal.  Mouth/Throat: Oropharynx is clear and moist. No oropharyngeal exudate.  Eyes: Conjunctivae and EOM are normal. Pupils are equal, round, and reactive to light.  Neck: Normal range of motion. Neck supple. No JVD present.  Cardiovascular: Normal rate, regular rhythm, normal heart sounds and intact distal pulses.  No murmur heard. Pulmonary/Chest: Effort normal and breath sounds normal. No respiratory distress. She has no  wheezes. She has no rhonchi. She has no rales. She exhibits no tenderness.  Abdominal: Soft. She exhibits no distension and no mass. Bowel sounds are decreased. There is generalized tenderness. There is no CVA tenderness, no tenderness at McBurney's point and negative Murphy's sign.  Generalized abdominal tenderness most significant at epigastric and umbilical area Also tender at LLQ, suprapubic and RLQ No G/R/R  Musculoskeletal: Normal range of motion. She exhibits no deformity.  Lymphadenopathy:    She has no  cervical adenopathy.  Neurological: She is alert and oriented to person, place, and time. No sensory deficit.  Skin: Skin is warm and dry. Capillary refill takes less than 2 seconds.  Psychiatric: She has a normal mood and affect. Her behavior is normal. Judgment and thought content normal.  Nursing note and vitals reviewed.    ED Treatments / Results  Labs (all labs ordered are listed, but only abnormal results are displayed) Labs Reviewed  COMPREHENSIVE METABOLIC PANEL - Abnormal; Notable for the following components:      Result Value   Glucose, Bld 106 (*)    All other components within normal limits  POC OCCULT BLOOD, ED - Abnormal; Notable for the following components:   Fecal Occult Bld POSITIVE (*)    All other components within normal limits  LIPASE, BLOOD  CBC  URINALYSIS, ROUTINE W REFLEX MICROSCOPIC  I-STAT TROPONIN, ED    EKG  EKG Interpretation  Date/Time:  Friday March 14 2017 04:54:13 EST Ventricular Rate:  75 PR Interval:  176 QRS Duration: 84 QT Interval:  394 QTC Calculation: 439 R Axis:   20 Text Interpretation:  Normal sinus rhythm Normal ECG Confirmed by Pryor Curia [phone]) on 03/14/2017 5:13:19 AM       Radiology Ct Abdomen Pelvis W Contrast  Result Date: 03/14/2017 CLINICAL DATA:  56 year old female with epigastric pain and severe nausea onset yesterday. Personal history of ulcerative colitis and prior abdominal abscess. EXAM: CT ABDOMEN AND PELVIS WITH CONTRAST TECHNIQUE: Multidetector CT imaging of the abdomen and pelvis was performed using the standard protocol following bolus administration of intravenous contrast. CONTRAST:  100 mL Isovue-300 COMPARISON:  CT Abdomen and Pelvis 04/04/2015 and earlier. FINDINGS: Lower chest: Stable mild cardiomegaly. No pericardial or pleural effusion. Dependent pulmonary opacity similar to the prior study and most compatible with atelectasis. Chronic mild elevation of the right hemidiaphragm. Hepatobiliary:  Borderline to Mild hepatic steatosis. Otherwise negative liver and gallbladder. Pancreas: Negative. Spleen: Negative. Adrenals/Urinary Tract: Normal adrenal glands. Bilateral renal enhancement and contrast excretion is symmetric and within normal limits. No nephrolithiasis. Negative course of both ureters. Unremarkable urinary bladder. Stomach/Bowel: Small perirectal lymph nodes are stable the decreased since 2016. Possible mild rectal mucosal hyper enhancement (series 3, image 74), but no mesenteric stranding. Negative sigmoid colon aside from redundancy and retained stool. Similar retained stool throughout the left colon and transverse colon with no wall thickening or inflammation. Redundant hepatic flexure with similar retained stool continuing in the right colon to the cecum. Normal appendix (series 3, image 59). No right colon wall thickening or inflammation. Negative terminal ileum. No dilated small bowel. Small chronic gastric hiatal hernia suspected. Otherwise negative stomach and duodenum. No abdominal free fluid. Vascular/Lymphatic: Major arterial structures appear normal. Portal venous system is patent. Normal abdominal lymph nodes. Chronically increased perirectal lymph nodes in the pelvis are stable to mildly decreased since 2016. Reproductive: Stable since 2016. Small chronic benign cyst or fluid collection along the posterior parametrium on the left as seen on series 3,  image 74. This appears stable since at least 2012, and separate from the left ovary which is suspected along the left uterine fundus. Other: No pelvic free fluid. Musculoskeletal: No acute osseous abnormality identified. Severe chronic degenerative and/or postinflammatory changes along the bilateral SI joints. Comparatively little spine degeneration elsewhere. IMPRESSION: 1. Possible mucosal hyper-enhancement at the rectum which could reflect exacerbation of ulcerative colitis vs. Mild other proctitis. No complicating features and  chronically prominent perirectal lymph nodes are stable. 2. No other acute or inflammatory process identified in the abdomen or pelvis. 3. Chronic sacroiliitis. Electronically Signed   By: Genevie Ann M.D.   On: 03/14/2017 07:49    Procedures Procedures (including critical care time)  Medications Ordered in ED Medications  iopamidol (ISOVUE-300) 61 % injection (not administered)  sodium chloride 0.9 % bolus 1,000 mL (1,000 mLs Intravenous New Bag/Given 03/14/17 0645)  ondansetron (ZOFRAN) injection 4 mg (4 mg Intravenous Given 03/14/17 0645)  morphine 4 MG/ML injection 4 mg (4 mg Intravenous Given 03/14/17 0645)  gi cocktail (Maalox,Lidocaine,Donnatal) (30 mLs Oral Given 03/14/17 0645)  famotidine (PEPCID) tablet 40 mg (40 mg Oral Given 03/14/17 0645)  HYDROmorphone (DILAUDID) injection 1 mg (1 mg Intravenous Given 03/14/17 0831)     Initial Impression / Assessment and Plan / ED Course  I have reviewed the triage vital signs and the nursing notes.  Pertinent labs & imaging results that were available during my care of the patient were reviewed by me and considered in my medical decision making (see chart for details).  Clinical Course as of Mar 14 912  Fri Mar 14, 2017  0809 IMPRESSION: 1. Possible mucosal hyper-enhancement at the rectum which could reflect exacerbation of ulcerative colitis vs. Mild other proctitis. No complicating features and chronically prominent perirectal lymph nodes are stable. 2. No other acute or inflammatory process identified in the abdomen or pelvis. 3. Chronic sacroiliitis. CT Abdomen Pelvis W Contrast [CG]  6468 Reassessed patient. Reports 7/10 pain, moderate discomfort with palpation. Will give analgesia and reassess.   [CG]  [id number] Fecal Occult Blood, POC: (!) POSITIVE [CG]    Clinical Course User Index [CG] Kinnie Feil, PA-C   56 yo female with h/o untreated UC presents to ED for indigestion, epigastric abdominal discomfort, looser and darker BMs with  chills. VS w/o fever. She has moderate tenderness most significant at epigastrium but no G/R/R. +hemoccult but no gross melena or hematochezia  Lab work unremarkable. CT scan shows mucosal inflammation of rectum w/o abscess or fistula. Pt does not use enemas or suppositories to cause inflammation, likely UC..   Pain and nausea controlled in ED. She tolerated PO challenge. Pt has not followed up with GI or taken maintenance med for UC in many years, states due to financial reasons. Given poor f/u and compliance concerned for UC complication, explain s/s that would warrant return to ED. Will d/c with abx and prednisone. Pt verbalized understanding. Encouraged her to f/u with GI for routine colonoscopy and maintenance med. Pt shared with SP.   Final Clinical Impressions(s) / ED Diagnoses   Final diagnoses:  Ulcerative colitis, acute, without complications [facility])    ED Discharge Orders    None       Kinnie Feil, PA-C 03/14/17 0913    Ripley Fraise, MD 03/16/17 1036

## 2017-03-14 NOTE — ED Triage Notes (Signed)
Patient reports that around midnight she started having severe upper abdominal pain. It feels somewhat like her ulcerative colitis. She was having bad heartburn and took a zantac for the same. Nauseated now, had some diarrhea yesterday morning.

## 2017-03-14 NOTE — Discharge Instructions (Signed)
You CT scan showed some inflammation in rectum likely representing flare of ulcerative colitis.   Please take antibiotic and prednisone as prescribed.   You need routine GI follow up for colonoscopy and medication maintenance. Please follow up with Dr Paulita Fujita as soon as possible.   Return for fevers, worsening abdominal pain, vomiting

## 2017-03-14 NOTE — ED Notes (Signed)
Pt given Ginger ale per MD approval. 

## 2017-05-11 ENCOUNTER — Emergency Department (HOSPITAL_COMMUNITY)
Admission: EM | Admit: 2017-05-11 | Discharge: 2017-05-11 | Disposition: A | Discharge status: Home / Self Care | Payer: BLUE CROSS/BLUE SHIELD | Attending: Emergency Medicine | Admitting: Emergency Medicine

## 2017-05-11 ENCOUNTER — Emergency Department (HOSPITAL_COMMUNITY): Payer: BLUE CROSS/BLUE SHIELD

## 2017-05-11 ENCOUNTER — Other Ambulatory Visit: Payer: Self-pay

## 2017-05-11 DIAGNOSIS — J209 Acute bronchitis, unspecified: Secondary | ICD-10-CM | POA: Insufficient documentation

## 2017-05-11 DIAGNOSIS — J0100 Acute maxillary sinusitis, unspecified: Secondary | ICD-10-CM | POA: Diagnosis not present

## 2017-05-11 DIAGNOSIS — R0981 Nasal congestion: Secondary | ICD-10-CM | POA: Diagnosis present

## 2017-05-11 MED ORDER — ALBUTEROL SULFATE HFA 108 (90 BASE) MCG/ACT IN AERS
1.0000 | INHALATION_SPRAY | RESPIRATORY_TRACT | Status: DC | PRN
  Administered 2017-05-11: 2 via RESPIRATORY_TRACT
  Filled 2017-05-11: qty 6.7

## 2017-05-11 MED ORDER — FLUTICASONE PROPIONATE 50 MCG/ACT NA SUSP: 2.0000 | Freq: Every day | NASAL | 0 refills | Status: DC

## 2017-05-11 MED ORDER — PREDNISONE 20 MG PO TABS
60.0000 mg | ORAL_TABLET | Freq: Once | ORAL | Status: AC
  Administered 2017-05-11: 60 mg via ORAL
  Filled 2017-05-11: qty 3

## 2017-05-11 MED ORDER — LORATADINE 10 MG PO TABS: 10.0000 mg | ORAL_TABLET | Freq: Every day | ORAL | 0 refills | Status: DC

## 2017-05-11 MED ORDER — PREDNISONE 20 MG PO TABS: ORAL_TABLET | ORAL | 0 refills | Status: DC

## 2017-05-11 MED ORDER — AZITHROMYCIN 250 MG PO TABS: 250.0000 mg | ORAL_TABLET | Freq: Every day | ORAL | 0 refills | Status: DC

## 2017-05-11 MED ORDER — ALBUTEROL SULFATE (2.5 MG/3ML) 0.083% IN NEBU
5.0000 mg | INHALATION_SOLUTION | Freq: Once | RESPIRATORY_TRACT | Status: AC
  Administered 2017-05-11: 5 mg via RESPIRATORY_TRACT
  Filled 2017-05-11: qty 6

## 2017-05-11 NOTE — ED Provider Notes (Signed)
[facility] EMERGENCY DEPARTMENT Provider Note   CSN: [id number] Arrival date & time: 05/11/17  1122     History   Chief Complaint Chief Complaint  Patient presents with  . URI    HPI Crystal Benton is a 57 y.o. female.  HPI Patient presents with 4 days of nasal congestion, sinus pressure, sore throat, cough productive of yellow mucus, wheezing and shortness of breath.  Has taken Mucinex at home with no relief.  Complains of subjective fevers and chills. Past Medical History:  Diagnosis Date  . Ulcerative colitis     Patient Active Problem List   Diagnosis Date Noted  . UNSPECIFIED ANEMIA 01/24/2009  . IBD 01/24/2009    Past Surgical History:  Procedure Laterality Date  . ABDOMINAL SURGERY    . COLON SURGERY      OB History    No data available       Home Medications    Prior to Admission medications   Medication Sig Start Date End Date Taking? Authorizing Provider  amoxicillin-clavulanate (AUGMENTIN) 875-125 MG tablet Take 1 tablet every 12 (twelve) hours by mouth. 03/14/17   Kinnie Feil, PA-C  azithromycin (ZITHROMAX) 250 MG tablet Take 1 tablet (250 mg total) by mouth daily. Take first 2 tablets together, then 1 every day until finished. 05/11/17   Julianne Rice, MD  fluticasone (FLONASE) 50 MCG/ACT nasal spray Place 2 sprays into both nostrils daily. 05/11/17   Julianne Rice, MD  loratadine (CLARITIN) 10 MG tablet Take 1 tablet (10 mg total) by mouth daily. 05/11/17   Julianne Rice, MD  naproxen sodium (ALEVE) 220 MG tablet Take 440 mg daily as needed by mouth (pain).    [provider]  ondansetron (ZOFRAN ODT) 4 MG disintegrating tablet Take 1 tablet (4 mg total) every 8 (eight) hours as needed by mouth for nausea or vomiting. 03/14/17   Kinnie Feil, PA-C  predniSONE (DELTASONE) 20 MG tablet 3 tabs po day one, then 2 po daily x 4 days 05/11/17   Julianne Rice, MD    Family History Family History  Problem Relation  Age of Onset  . Hypertension Mother   . Stroke Mother   . Multiple sclerosis Sister     Social History Social History   Tobacco Use  . Smoking status: Never Smoker  . Smokeless tobacco: Never Used  Substance Use Topics  . Alcohol use: Yes    Comment: occ  . Drug use: No     Allergies   Onion; Flagyl [metronidazole]; and Metoclopramide hcl   Review of Systems Review of Systems  Constitutional: Positive for chills, fatigue and fever.  HENT: Positive for congestion, sinus pressure, sinus pain and sore throat.   Respiratory: Positive for cough, shortness of breath and wheezing.   Cardiovascular: Negative for chest pain.  Gastrointestinal: Negative for abdominal pain, nausea and vomiting.  Musculoskeletal: Negative for back pain, myalgias, neck pain and neck stiffness.  Skin: Negative for rash and wound.  Neurological: Positive for headaches. Negative for dizziness, weakness and numbness.  All other systems reviewed and are negative.    Physical Exam Updated Vital Signs BP 123/84   Pulse 82   Temp 98.6 F (37 C)   Resp 18   Wt 95.3 kg (210 lb)   LMP 05/12/2013   SpO2 100%   BMI 32.89 kg/m   Physical Exam  Constitutional: She is oriented to person, place, and time. She appears well-developed and well-nourished. No distress.  HENT:  Head: Normocephalic and atraumatic.  Mouth/Throat: Oropharynx is clear and moist.  Bilateral nasal mucosal edema.  Oropharynx is mildly erythematous.  Eyes: EOM are normal. Pupils are equal, round, and reactive to light.  Neck: Normal range of motion. Neck supple.  Cardiovascular: Normal rate and regular rhythm. Exam reveals no gallop and no friction rub.  No murmur heard. Pulmonary/Chest: Effort normal. She has wheezes.  Expiratory wheezing throughout  Abdominal: Soft. Bowel sounds are normal. There is no tenderness. There is no rebound and no guarding.  Musculoskeletal: Normal range of motion. She exhibits no edema or tenderness.    Lymphadenopathy:    She has no cervical adenopathy.  Neurological: She is alert and oriented to person, place, and time.  Moves all extremities without focal deficit.  Sensation fully intact.  Skin: Skin is warm and dry. Capillary refill takes less than 2 seconds. No rash noted. No erythema.  Psychiatric: She has a normal mood and affect. Her behavior is normal.  Nursing note and vitals reviewed.    ED Treatments / Results  Labs (all labs ordered are listed, but only abnormal results are displayed) Labs Reviewed - No data to display  EKG  EKG Interpretation None       Radiology Dg Chest 2 View  Result Date: 05/11/2017 CLINICAL DATA:  Cough and fever for 3 days. EXAM: CHEST  2 VIEW COMPARISON:  09/09/2016 and prior radiographs. FINDINGS: The cardiomediastinal silhouette is unremarkable. No significant change from prior radiographs. There is no evidence of focal airspace disease, pulmonary edema, suspicious pulmonary nodule/mass, pleural effusion, or pneumothorax. No acute bony abnormalities are identified. IMPRESSION: No active cardiopulmonary disease. Electronically Signed   By: Margarette Canada M.D.   On: 05/11/2017 12:30    Procedures Procedures (including critical care time)  Medications Ordered in ED Medications  albuterol (PROVENTIL HFA;VENTOLIN HFA) 108 (90 Base) MCG/ACT inhaler 1-2 puff (not administered)  predniSONE (DELTASONE) tablet 60 mg (60 mg Oral Given 05/11/17 1446)  albuterol (PROVENTIL) (2.5 MG/3ML) 0.083% nebulizer solution 5 mg (5 mg Nebulization Given 05/11/17 1446)     Initial Impression / Assessment and Plan / ED Course  I have reviewed the triage vital signs and the nursing notes.  Pertinent labs & imaging results that were available during my care of the patient were reviewed by me and considered in my medical decision making (see chart for details).     Given dose of steroids and albuterol treatment in the ED.  We will treat for bronchitis/bronchospasm.   Return precautions given.  Final Clinical Impressions(s) / ED Diagnoses   Final diagnoses:  Acute maxillary sinusitis, recurrence not specified  Bronchitis with bronchospasm    ED Discharge Orders        Ordered    loratadine (CLARITIN) 10 MG tablet  Daily     05/11/17 1529    fluticasone (FLONASE) 50 MCG/ACT nasal spray  Daily     05/11/17 1529    predniSONE (DELTASONE) 20 MG tablet     05/11/17 1529    azithromycin (ZITHROMAX) 250 MG tablet  Daily     05/11/17 1530       Julianne Rice, MD 05/11/17 1530

## 2017-05-11 NOTE — ED Triage Notes (Addendum)
Pt states on Thursday she began having a cough with green and yellow mucous, fevers at home. Afebrile at present. She works at [employer] a and cannot go back to work yet. 100% on room air, vitals stable. Pt also congestion. Pt took mucinex with no relief.

## 2017-05-11 NOTE — ED Notes (Signed)
Declined W/C at D/C and was escorted to lobby by RN. 

## 2017-07-30 ENCOUNTER — Other Ambulatory Visit: Payer: Self-pay

## 2017-07-30 ENCOUNTER — Emergency Department (HOSPITAL_COMMUNITY)
Admission: EM | Admit: 2017-07-30 | Discharge: 2017-07-30 | Disposition: A | Discharge status: Home / Self Care | Payer: BLUE CROSS/BLUE SHIELD | Attending: Emergency Medicine | Admitting: Emergency Medicine

## 2017-07-30 ENCOUNTER — Encounter (HOSPITAL_COMMUNITY): Payer: Self-pay

## 2017-07-30 DIAGNOSIS — Z5321 Procedure and treatment not carried out due to patient leaving prior to being seen by health care provider: Secondary | ICD-10-CM | POA: Diagnosis not present

## 2017-07-30 DIAGNOSIS — R109 Unspecified abdominal pain: Secondary | ICD-10-CM | POA: Diagnosis not present

## 2017-07-30 DIAGNOSIS — R110 Nausea: Secondary | ICD-10-CM | POA: Insufficient documentation

## 2017-07-30 LAB — URINALYSIS, ROUTINE W REFLEX MICROSCOPIC
BACTERIA UA: NONE SEEN
BILIRUBIN URINE: NEGATIVE
Glucose, UA: NEGATIVE mg/dL
Hgb urine dipstick: NEGATIVE
Ketones, ur: NEGATIVE mg/dL
Nitrite: NEGATIVE
PH: 5 (ref 5.0–8.0)
Protein, ur: NEGATIVE mg/dL
SPECIFIC GRAVITY, URINE: 1.028 (ref 1.005–1.030)

## 2017-07-30 LAB — COMPREHENSIVE METABOLIC PANEL
ALBUMIN: 4.2 g/dL (ref 3.5–5.0)
ALT: 20 U/L (ref 14–54)
AST: 19 U/L (ref 15–41)
Alkaline Phosphatase: 97 U/L (ref 38–126)
Anion gap: 11 (ref 5–15)
BILIRUBIN TOTAL: 0.6 mg/dL (ref 0.3–1.2)
BUN: 24 mg/dL — AB (ref 6–20)
CHLORIDE: 106 mmol/L (ref 101–111)
CO2: 24 mmol/L (ref 22–32)
CREATININE: 0.77 mg/dL (ref 0.44–1.00)
Calcium: 9.5 mg/dL (ref 8.9–10.3)
GFR calc Af Amer: >60 mL/min (ref >60)
Glucose, Bld: 108 mg/dL — ABNORMAL HIGH (ref 65–99)
Potassium: 4 mmol/L (ref 3.5–5.1)
Sodium: 141 mmol/L (ref 135–145)
TOTAL PROTEIN: 7.3 g/dL (ref 6.5–8.1)

## 2017-07-30 LAB — CBC
HEMATOCRIT: 43.1 % (ref 36.0–46.0)
Hemoglobin: 14.7 g/dL (ref 12.0–15.0)
MCH: 33 pg (ref 26.0–34.0)
MCHC: 34.1 g/dL (ref 30.0–36.0)
MCV: 96.6 fL (ref 78.0–100.0)
PLATELETS: 250 10*3/uL (ref 150–400)
RBC: 4.46 MIL/uL (ref 3.87–5.11)
RDW: 12.9 % (ref 11.5–15.5)
WBC: 6.4 10*3/uL (ref 4.0–10.5)

## 2017-07-30 LAB — LIPASE, BLOOD: Lipase: 30 U/L (ref 11–51)

## 2017-07-30 NOTE — ED Triage Notes (Signed)
Patient complains of abdominal pain that started this am 0400 wirth nausea. States that she has flare up of colitis.

## 2017-07-30 NOTE — ED Notes (Signed)
Pt requesting to leave due to extended wait time. Pt advised to stay but refused, armband removed and moved to OTF.

## 2018-02-22 ENCOUNTER — Encounter (HOSPITAL_COMMUNITY): Payer: Self-pay | Admitting: *Deleted

## 2018-02-22 ENCOUNTER — Emergency Department (HOSPITAL_COMMUNITY)
Admission: EM | Admit: 2018-02-22 | Discharge: 2018-02-22 | Disposition: A | Discharge status: Home / Self Care | Payer: BLUE CROSS/BLUE SHIELD | Attending: Emergency Medicine | Admitting: Emergency Medicine

## 2018-02-22 ENCOUNTER — Other Ambulatory Visit: Payer: Self-pay

## 2018-02-22 DIAGNOSIS — H9202 Otalgia, left ear: Secondary | ICD-10-CM

## 2018-02-22 DIAGNOSIS — J069 Acute upper respiratory infection, unspecified: Secondary | ICD-10-CM

## 2018-02-22 DIAGNOSIS — J020 Streptococcal pharyngitis: Secondary | ICD-10-CM | POA: Diagnosis not present

## 2018-02-22 DIAGNOSIS — J029 Acute pharyngitis, unspecified: Secondary | ICD-10-CM | POA: Diagnosis present

## 2018-02-22 MED ORDER — FLUTICASONE PROPIONATE 50 MCG/ACT NA SUSP: 1.0000 | Freq: Every day | NASAL | 0 refills | Status: DC

## 2018-02-22 MED ORDER — PHENOL 1.4 % MT LIQD: 1.0000 | OROMUCOSAL | 0 refills | Status: DC | PRN

## 2018-02-22 MED ORDER — NAPROXEN 500 MG PO TABS
500.0000 mg | ORAL_TABLET | Freq: Once | ORAL | Status: AC
  Administered 2018-02-22: 500 mg via ORAL
  Filled 2018-02-22: qty 1

## 2018-02-22 MED ORDER — NAPROXEN 500 MG PO TABS: 500.0000 mg | ORAL_TABLET | Freq: Two times a day (BID) | ORAL | 0 refills | Status: DC

## 2018-02-22 NOTE — ED Triage Notes (Signed)
Pt reports starting Thursday night she had discomfort in her throat and Friday, worse along with left ear pain.

## 2018-02-22 NOTE — Discharge Instructions (Signed)
You likely have a viral illness.  This should be treated symptomatically. Use naproxen as needed for pain. You may supplement with tylenol.  Use Flonase daily for nasal congestion and cough. Use the sore throat spray as needed.  Make sure you stay well-hydrated with water. Wash your hands frequently to prevent spread of infection. Follow-up with your primary care doctor in 1 week if your symptoms are not improving. Return to the emergency room if you develop chest pain, difficulty breathing, or any new or worsening symptoms.

## 2018-02-22 NOTE — ED Provider Notes (Signed)
[facility] DEPT Provider Note   CSN: [id number] Arrival date & time: 02/22/18  0201     History   Chief Complaint Chief Complaint  Patient presents with  . Sore Throat    HPI Crystal Benton is a 57 y.o. female presenting for evaluation of sore throat and ear pain.  Patient states for the past 3 days, she has been having left-sided ear pain and left-sided sore throat.  Pain is constant.  She tried 1 dose of Aleve without improvement of symptoms.  Has not tried anything else.  Nothing makes the pain better or worse.  She denies associated fevers, chills, right-sided ear pain, eye pain, nasal congestion, cough, chest pain, shortness of breath, nausea, vomiting, abdominal pain.  Patient denies sick contacts.  Does not use tobacco products.  Patient states she has a history of ulcerative colitis, takes no medications daily.  Patient denies difficulty swallowing saliva.  No muffled voice.  Patient denies dental pain.  HPI  Past Medical History:  Diagnosis Date  . Ulcerative colitis     Patient Active Problem List   Diagnosis Date Noted  . UNSPECIFIED ANEMIA 01/24/2009  . IBD 01/24/2009    Past Surgical History:  Procedure Laterality Date  . ABDOMINAL SURGERY    . COLON SURGERY       OB History   None      Home Medications    Prior to Admission medications   Medication Sig Start Date End Date Taking? Authorizing Provider  amoxicillin-clavulanate (AUGMENTIN) 875-125 MG tablet Take 1 tablet every 12 (twelve) hours by mouth. 03/14/17   Kinnie Feil, PA-C  azithromycin (ZITHROMAX) 250 MG tablet Take 1 tablet (250 mg total) by mouth daily. Take first 2 tablets together, then 1 every day until finished. 05/11/17   Julianne Rice, MD  fluticasone (FLONASE) 50 MCG/ACT nasal spray Place 1 spray into both nostrils daily. 02/22/18   Jaun Galluzzo, PA-C  loratadine (CLARITIN) 10 MG tablet Take 1 tablet (10 mg total) by mouth daily. 05/11/17    Julianne Rice, MD  naproxen (NAPROSYN) 500 MG tablet Take 1 tablet (500 mg total) by mouth 2 (two) times daily with a meal. 02/22/18   Delando Satter, PA-C  naproxen sodium (ALEVE) 220 MG tablet Take 440 mg daily as needed by mouth (pain).    [provider]  ondansetron (ZOFRAN ODT) 4 MG disintegrating tablet Take 1 tablet (4 mg total) every 8 (eight) hours as needed by mouth for nausea or vomiting. 03/14/17   Kinnie Feil, PA-C  phenol (CHLORASEPTIC) 1.4 % LIQD Use as directed 1 spray in the mouth or throat as needed for throat irritation / pain. 02/22/18   Jancie Kercher, PA-C  predniSONE (DELTASONE) 20 MG tablet 3 tabs po day one, then 2 po daily x 4 days 05/11/17   Julianne Rice, MD    Family History Family History  Problem Relation Age of Onset  . Hypertension Mother   . Stroke Mother   . Multiple sclerosis Sister     Social History Social History   Tobacco Use  . Smoking status: Never Smoker  . Smokeless tobacco: Never Used  Substance Use Topics  . Alcohol use: Yes    Comment: occ  . Drug use: No     Allergies   Onion; Flagyl [metronidazole]; and Metoclopramide hcl   Review of Systems Review of Systems  HENT: Positive for ear pain and sore throat.      Physical Exam Updated Vital  Signs BP (!) 145/93   Pulse 84   Temp 98.4 F (36.9 C) (Oral)   Resp 16   LMP 05/12/2013   SpO2 100%   Physical Exam  Constitutional: She is oriented to person, place, and time. She appears well-developed and well-nourished. No distress.  In no acute distress  HENT:  Head: Normocephalic and atraumatic.  Right Ear: Tympanic membrane, external ear and ear canal normal.  Left Ear: Tympanic membrane, external ear and ear canal normal.  Nose: Mucosal edema present. Right sinus exhibits no maxillary sinus tenderness and no frontal sinus tenderness. Left sinus exhibits no maxillary sinus tenderness and no frontal sinus tenderness.  Mouth/Throat: Uvula is  midline, oropharynx is clear and moist and mucous membranes are normal. No tonsillar exudate.  Overall very poor dentition.  OP clear without tonsillar swelling or exudate.  Uvula midline with equal palate rise.  Handling secretions easily.  Airway intact.  No muffled voice.  No pain under the tongue.  No obvious dental abscess or gingival swelling/erythema.  TMs nonerythematous and nonbulging bilaterally.  Eyes: Pupils are equal, round, and reactive to light. Conjunctivae and EOM are normal.  Neck: Normal range of motion.  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  Pulmonary/Chest: Effort normal and breath sounds normal. She has no decreased breath sounds. She has no wheezes. She has no rhonchi. She has no rales.  Pt speaking in full sentences without difficulty.  Clear lung sounds in all fields  Abdominal: Soft. She exhibits no distension. There is no tenderness.  Musculoskeletal: Normal range of motion.  Lymphadenopathy:    She has cervical adenopathy.  Neurological: She is alert and oriented to person, place, and time.  Skin: Skin is warm. Capillary refill takes less than 2 seconds.  Psychiatric: She has a normal mood and affect.  Nursing note and vitals reviewed.    ED Treatments / Results  Labs (all labs ordered are listed, but only abnormal results are displayed) Labs Reviewed - No data to display  EKG None  Radiology No results found.  Procedures Procedures (including critical care time)  Medications Ordered in ED Medications  naproxen (NAPROSYN) tablet 500 mg (500 mg Oral Given 02/22/18 0240)     Initial Impression / Assessment and Plan / ED Course  I have reviewed the triage vital signs and the nursing notes.  Pertinent labs & imaging results that were available during my care of the patient were reviewed by me and considered in my medical decision making (see chart for details).     Patient  presenting for evaluation of left-sided ear and throat pain for the past several days.  Physical exam reassuring, she is afebrile not tachycardic.  Appears nontoxic.  Oral exam reassuring, no tonsillar swelling or exudate.  Uvula is midline.  Doubt peritonsillar abscess.  No obvious dental abscess, although patient with overall poor dentition. No AOM. Pt with L sided post-auricular LAD and cervical LAD.  Consider viral source.  Discussed findings with patient.  Discussed symptomatic treatment, follow-up with PCP if symptoms are not improving.  At this time, patient appears safe for discharge.  Return precautions given.  Patient states she understands and agrees to plan.   Final Clinical Impressions(s) / ED Diagnoses   Final diagnoses:  Strep throat  Left ear pain  Upper respiratory tract infection, unspecified type    ED Discharge Orders         Ordered  naproxen (NAPROSYN) 500 MG tablet  2 times daily with meals     02/22/18 0236    fluticasone (FLONASE) 50 MCG/ACT nasal spray  Daily     02/22/18 0236    phenol (CHLORASEPTIC) 1.4 % LIQD  As needed     02/22/18 0236           Ahmira Boisselle, PA-C 79/98/72 1587    Delora Fuel, MD 27/61/84 [phone]

## 2018-05-26 ENCOUNTER — Emergency Department (HOSPITAL_COMMUNITY): Payer: BLUE CROSS/BLUE SHIELD

## 2018-05-26 ENCOUNTER — Other Ambulatory Visit: Payer: Self-pay

## 2018-05-26 ENCOUNTER — Inpatient Hospital Stay (HOSPITAL_COMMUNITY)
Admission: EM | Admit: 2018-05-26 | Discharge: 2018-05-30 | DRG: 387 | Disposition: A | Discharge status: Home / Self Care | Payer: BLUE CROSS/BLUE SHIELD | Attending: Internal Medicine | Admitting: Internal Medicine

## 2018-05-26 ENCOUNTER — Encounter (HOSPITAL_COMMUNITY): Payer: Self-pay

## 2018-05-26 DIAGNOSIS — Z9119 Patient's noncompliance with other medical treatment and regimen: Secondary | ICD-10-CM | POA: Diagnosis not present

## 2018-05-26 DIAGNOSIS — Z7951 Long term (current) use of inhaled steroids: Secondary | ICD-10-CM | POA: Diagnosis not present

## 2018-05-26 DIAGNOSIS — K219 Gastro-esophageal reflux disease without esophagitis: Secondary | ICD-10-CM | POA: Diagnosis not present

## 2018-05-26 DIAGNOSIS — Z87892 Personal history of anaphylaxis: Secondary | ICD-10-CM

## 2018-05-26 DIAGNOSIS — K51311 Ulcerative (chronic) rectosigmoiditis with rectal bleeding: Secondary | ICD-10-CM | POA: Diagnosis present

## 2018-05-26 DIAGNOSIS — Z86010 Personal history of colonic polyps: Secondary | ICD-10-CM | POA: Diagnosis not present

## 2018-05-26 DIAGNOSIS — K51211 Ulcerative (chronic) proctitis with rectal bleeding: Secondary | ICD-10-CM | POA: Diagnosis not present

## 2018-05-26 DIAGNOSIS — K5190 Ulcerative colitis, unspecified, without complications: Secondary | ICD-10-CM | POA: Diagnosis not present

## 2018-05-26 DIAGNOSIS — K5180 Other ulcerative colitis without complications: Secondary | ICD-10-CM | POA: Diagnosis present

## 2018-05-26 DIAGNOSIS — K6289 Other specified diseases of anus and rectum: Secondary | ICD-10-CM | POA: Diagnosis present

## 2018-05-26 DIAGNOSIS — K635 Polyp of colon: Secondary | ICD-10-CM | POA: Diagnosis not present

## 2018-05-26 DIAGNOSIS — E860 Dehydration: Secondary | ICD-10-CM | POA: Diagnosis present

## 2018-05-26 DIAGNOSIS — Z79899 Other long term (current) drug therapy: Secondary | ICD-10-CM | POA: Diagnosis not present

## 2018-05-26 DIAGNOSIS — Z91018 Allergy to other foods: Secondary | ICD-10-CM | POA: Diagnosis not present

## 2018-05-26 DIAGNOSIS — Z888 Allergy status to other drugs, medicaments and biological substances status: Secondary | ICD-10-CM | POA: Diagnosis not present

## 2018-05-26 DIAGNOSIS — D120 Benign neoplasm of cecum: Secondary | ICD-10-CM | POA: Diagnosis present

## 2018-05-26 LAB — URINALYSIS, ROUTINE W REFLEX MICROSCOPIC
Bilirubin Urine: NEGATIVE
GLUCOSE, UA: NEGATIVE mg/dL
HGB URINE DIPSTICK: NEGATIVE
Ketones, ur: NEGATIVE mg/dL
LEUKOCYTES UA: NEGATIVE
Nitrite: NEGATIVE
PH: 5 (ref 5.0–8.0)
PROTEIN: NEGATIVE mg/dL
Specific Gravity, Urine: >1.046 — ABNORMAL HIGH (ref 1.005–1.030)

## 2018-05-26 LAB — COMPREHENSIVE METABOLIC PANEL
ALT: 24 U/L (ref 0–44)
AST: 19 U/L (ref 15–41)
Albumin: 3.8 g/dL (ref 3.5–5.0)
Alkaline Phosphatase: 89 U/L (ref 38–126)
Anion gap: 9 (ref 5–15)
BUN: 17 mg/dL (ref 6–20)
CO2: 23 mmol/L (ref 22–32)
Calcium: 9.2 mg/dL (ref 8.9–10.3)
Chloride: 108 mmol/L (ref 98–111)
Creatinine, Ser: 0.83 mg/dL (ref 0.44–1.00)
GFR calc Af Amer: >60 mL/min (ref >60)
GFR calc non Af Amer: >60 mL/min (ref >60)
Glucose, Bld: 109 mg/dL — ABNORMAL HIGH (ref 70–99)
Potassium: 3.6 mmol/L (ref 3.5–5.1)
Sodium: 140 mmol/L (ref 135–145)
Total Bilirubin: 0.4 mg/dL (ref 0.3–1.2)
Total Protein: 6.8 g/dL (ref 6.5–8.1)

## 2018-05-26 LAB — CBC
HEMATOCRIT: 40.1 % (ref 36.0–46.0)
Hemoglobin: 13.6 g/dL (ref 12.0–15.0)
MCH: 32.9 pg (ref 26.0–34.0)
MCHC: 33.9 g/dL (ref 30.0–36.0)
MCV: 96.9 fL (ref 80.0–100.0)
PLATELETS: 283 10*3/uL (ref 150–400)
RBC: 4.14 MIL/uL (ref 3.87–5.11)
RDW: 12.6 % (ref 11.5–15.5)
WBC: 5.7 10*3/uL (ref 4.0–10.5)
nRBC: 0 % (ref 0.0–0.2)

## 2018-05-26 LAB — C-REACTIVE PROTEIN: CRP: 1.5 mg/dL — ABNORMAL HIGH (ref <1.0)

## 2018-05-26 LAB — LIPASE, BLOOD: LIPASE: 29 U/L (ref 11–51)

## 2018-05-26 LAB — I-STAT BETA HCG BLOOD, ED (MC, WL, AP ONLY): I-stat hCG, quantitative: <5 m[IU]/mL (ref <5)

## 2018-05-26 MED ORDER — HYDROMORPHONE HCL 1 MG/ML IJ SOLN
1.0000 mg | Freq: Once | INTRAMUSCULAR | Status: AC
  Administered 2018-05-26: 1 mg via INTRAVENOUS
  Filled 2018-05-26: qty 1

## 2018-05-26 MED ORDER — ACETAMINOPHEN 650 MG RE SUPP: 650.0000 mg | Freq: Four times a day (QID) | RECTAL | Status: DC | PRN

## 2018-05-26 MED ORDER — SENNOSIDES-DOCUSATE SODIUM 8.6-50 MG PO TABS: 1.0000 | ORAL_TABLET | Freq: Every evening | ORAL | Status: DC | PRN

## 2018-05-26 MED ORDER — PROMETHAZINE HCL 25 MG/ML IJ SOLN
12.5000 mg | Freq: Once | INTRAMUSCULAR | Status: AC
  Administered 2018-05-26: 12.5 mg via INTRAVENOUS
  Filled 2018-05-26: qty 1

## 2018-05-26 MED ORDER — ONDANSETRON HCL 4 MG/2ML IJ SOLN
4.0000 mg | Freq: Once | INTRAMUSCULAR | Status: AC
  Administered 2018-05-26: 4 mg via INTRAVENOUS
  Filled 2018-05-26: qty 2

## 2018-05-26 MED ORDER — MORPHINE SULFATE (PF) 4 MG/ML IV SOLN
4.0000 mg | Freq: Once | INTRAVENOUS | Status: AC
  Administered 2018-05-26: 4 mg via INTRAVENOUS
  Filled 2018-05-26: qty 1

## 2018-05-26 MED ORDER — METHYLPREDNISOLONE SODIUM SUCC 40 MG IJ SOLR
20.0000 mg | Freq: Three times a day (TID) | INTRAMUSCULAR | Status: DC
  Administered 2018-05-26 – 2018-05-30 (×13): 20 mg via INTRAVENOUS
  Filled 2018-05-26 (×13): qty 1

## 2018-05-26 MED ORDER — IOHEXOL 300 MG/ML  SOLN
100.0000 mL | Freq: Once | INTRAMUSCULAR | Status: AC | PRN
  Administered 2018-05-26: 100 mL via INTRAVENOUS

## 2018-05-26 MED ORDER — HYDROCORTISONE ACETATE 25 MG RE SUPP
25.0000 mg | Freq: Once | RECTAL | Status: DC
  Filled 2018-05-26: qty 1

## 2018-05-26 MED ORDER — ENOXAPARIN SODIUM 40 MG/0.4ML ~~LOC~~ SOLN
40.0000 mg | SUBCUTANEOUS | Status: DC
  Administered 2018-05-27 – 2018-05-30 (×3): 40 mg via SUBCUTANEOUS
  Filled 2018-05-26 (×4): qty 0.4

## 2018-05-26 MED ORDER — ALUM & MAG HYDROXIDE-SIMETH 200-200-20 MG/5ML PO SUSP
30.0000 mL | Freq: Once | ORAL | Status: AC
  Administered 2018-05-26: 30 mL via ORAL
  Filled 2018-05-26: qty 30

## 2018-05-26 MED ORDER — HYDROMORPHONE HCL 1 MG/ML IJ SOLN
0.5000 mg | Freq: Once | INTRAMUSCULAR | Status: AC
  Administered 2018-05-26: 0.5 mg via INTRAVENOUS
  Filled 2018-05-26: qty 1

## 2018-05-26 MED ORDER — HYDROMORPHONE HCL 1 MG/ML IJ SOLN
1.0000 mg | INTRAMUSCULAR | Status: DC | PRN
  Administered 2018-05-26 – 2018-05-30 (×17): 1 mg via INTRAVENOUS
  Filled 2018-05-26 (×18): qty 1

## 2018-05-26 MED ORDER — SODIUM CHLORIDE 0.9 % IV SOLN
INTRAVENOUS | Status: AC
  Administered 2018-05-26: 14:00:00 via INTRAVENOUS

## 2018-05-26 MED ORDER — ACETAMINOPHEN 325 MG PO TABS: 650.0000 mg | ORAL_TABLET | Freq: Four times a day (QID) | ORAL | Status: DC | PRN

## 2018-05-26 MED ORDER — ONDANSETRON 4 MG PO TBDP
4.0000 mg | ORAL_TABLET | Freq: Once | ORAL | Status: AC | PRN
  Administered 2018-05-26: 4 mg via ORAL
  Filled 2018-05-26: qty 1

## 2018-05-26 MED ORDER — ONDANSETRON HCL 4 MG/2ML IJ SOLN
4.0000 mg | Freq: Four times a day (QID) | INTRAMUSCULAR | Status: DC | PRN
  Administered 2018-05-26 – 2018-05-30 (×4): 4 mg via INTRAVENOUS
  Filled 2018-05-26 (×4): qty 2

## 2018-05-26 MED ORDER — HYDROXYZINE HCL 25 MG PO TABS
50.0000 mg | ORAL_TABLET | Freq: Once | ORAL | Status: AC
  Administered 2018-05-26: 50 mg via ORAL
  Filled 2018-05-26: qty 2

## 2018-05-26 MED ORDER — METHYLPREDNISOLONE SODIUM SUCC 125 MG IJ SOLR
125.0000 mg | Freq: Once | INTRAMUSCULAR | Status: AC
  Administered 2018-05-26: 125 mg via INTRAVENOUS
  Filled 2018-05-26: qty 2

## 2018-05-26 MED ORDER — SODIUM CHLORIDE 0.9% FLUSH
3.0000 mL | Freq: Once | INTRAVENOUS | Status: AC
  Administered 2018-05-26: 3 mL via INTRAVENOUS

## 2018-05-26 NOTE — ED Provider Notes (Signed)
[facility] EMERGENCY DEPARTMENT Provider Note   CSN: [id number] Arrival date & time: 05/26/18  0229     History   Chief Complaint Chief Complaint  Patient presents with  . Abdominal Pain    HPI Crystal Benton is a 58 y.o. female.  Patient with a history of ulcerative colitis presents with upper abdominal pain for the past 3 days, constant, associated with nausea and vomiting. She reports cold chills but no recorded fever. She has chronic diarrhea and states that is the same with the exception that it appears to have increased amount of blood. No chest pain, SOB, cough, urinary symptoms. She states she had an abscess associated with her ulcerative colitis 10 years ago and was concerned for this based on her symptoms.   The history is provided by the patient. No language interpreter was used.  Abdominal Pain  Associated symptoms: chills, diarrhea, nausea and vomiting     Past Medical History:  Diagnosis Date  . Ulcerative colitis     Patient Active Problem List   Diagnosis Date Noted  . UNSPECIFIED ANEMIA 01/24/2009  . IBD 01/24/2009    Past Surgical History:  Procedure Laterality Date  . ABDOMINAL SURGERY    . COLON SURGERY       OB History   No obstetric history on file.      Home Medications    Prior to Admission medications   Medication Sig Start Date End Date Taking? Authorizing Provider  amoxicillin-clavulanate (AUGMENTIN) 875-125 MG tablet Take 1 tablet every 12 (twelve) hours by mouth. 03/14/17   Kinnie Feil, PA-C  azithromycin (ZITHROMAX) 250 MG tablet Take 1 tablet (250 mg total) by mouth daily. Take first 2 tablets together, then 1 every day until finished. 05/11/17   Julianne Rice, MD  fluticasone (FLONASE) 50 MCG/ACT nasal spray Place 1 spray into both nostrils daily. 02/22/18   Caccavale, Sophia, PA-C  loratadine (CLARITIN) 10 MG tablet Take 1 tablet (10 mg total) by mouth daily. 05/11/17   Julianne Rice, MD  naproxen  (NAPROSYN) 500 MG tablet Take 1 tablet (500 mg total) by mouth 2 (two) times daily with a meal. 02/22/18   Caccavale, Sophia, PA-C  naproxen sodium (ALEVE) 220 MG tablet Take 440 mg daily as needed by mouth (pain).    [provider]  ondansetron (ZOFRAN ODT) 4 MG disintegrating tablet Take 1 tablet (4 mg total) every 8 (eight) hours as needed by mouth for nausea or vomiting. 03/14/17   Kinnie Feil, PA-C  phenol (CHLORASEPTIC) 1.4 % LIQD Use as directed 1 spray in the mouth or throat as needed for throat irritation / pain. 02/22/18   Caccavale, Sophia, PA-C  predniSONE (DELTASONE) 20 MG tablet 3 tabs po day one, then 2 po daily x 4 days 05/11/17   Julianne Rice, MD    Family History Family History  Problem Relation Age of Onset  . Hypertension Mother   . Stroke Mother   . Multiple sclerosis Sister     Social History Social History   Tobacco Use  . Smoking status: Never Smoker  . Smokeless tobacco: Never Used  Substance Use Topics  . Alcohol use: Yes    Comment: occ  . Drug use: No     Allergies   Onion; Flagyl [metronidazole]; and Metoclopramide hcl   Review of Systems Review of Systems  Constitutional: Positive for chills.  HENT: Negative.   Respiratory: Negative.   Cardiovascular: Negative.   Gastrointestinal: Positive for abdominal pain,  blood in stool, diarrhea, nausea and vomiting.  Genitourinary: Negative.   Musculoskeletal: Negative.   Skin: Negative.   Neurological: Negative.      Physical Exam Updated Vital Signs Ht 5\' 7"  (1.702 m)   Wt 93.9 kg   LMP 05/12/2013   BMI 32.42 kg/m   Physical Exam Constitutional:      Appearance: She is well-developed.  HENT:     Head: Normocephalic.  Neck:     Musculoskeletal: Normal range of motion and neck supple.  Cardiovascular:     Rate and Rhythm: Normal rate and regular rhythm.  Pulmonary:     Effort: Pulmonary effort is normal.     Breath sounds: Normal breath sounds. No wheezing, rhonchi  or rales.  Abdominal:     General: Bowel sounds are normal. There is no distension.     Palpations: Abdomen is soft.     Tenderness: There is generalized abdominal tenderness. There is guarding. There is no rebound.  Musculoskeletal: Normal range of motion.  Skin:    General: Skin is warm and dry.     Findings: No rash.  Neurological:     Mental Status: She is alert.     Cranial Nerves: No cranial nerve deficit.      ED Treatments / Results  Labs (all labs ordered are listed, but only abnormal results are displayed) Labs Reviewed  LIPASE, BLOOD  COMPREHENSIVE METABOLIC PANEL  CBC  URINALYSIS, ROUTINE W REFLEX MICROSCOPIC  I-STAT BETA HCG BLOOD, ED ([facility], [facility], [facility] ONLY)    EKG None  Radiology No results found.  Procedures Procedures (including critical care time)  Medications Ordered in ED Medications  sodium chloride flush (NS) 0.9 % injection 3 mL (has no administration in time range)  morphine 4 MG/ML injection 4 mg (has no administration in time range)  ondansetron (ZOFRAN-ODT) disintegrating tablet 4 mg (4 mg Oral Given 05/26/18 0250)     Initial Impression / Assessment and Plan / ED Course  I have reviewed the triage vital signs and the nursing notes.  Pertinent labs & imaging results that were available during my care of the patient were reviewed by me and considered in my medical decision making (see chart for details).     Patient with history of UC presents with 3 days of abdominal pain, N, V concerning to her of UC flare vs recurrent bowel abscess related to UC. Subjective fever, bloody diarrhea "more than usual".   Labs are reassuring, no leukocytosis. Pain has been difficult to control requiring multiple doses. Nausea also difficult to control. Unable to tolerate oral CM for CT scan. Multiple doses of antiemetics given as well.   CT scan shows findings consistent with UC flare, no evidence of abscess. IV steroids ordered. Patient updated on results. PO  challenge given.   She is not tolerating PO challenge. Additional medications ordered. Will need reassessment to determine if she can be discharged home vs admitted for intractable pain and vomiting associated with UC flare.   Patient care signed out to [facility], PA-C, pending reassessment to determine disposition.  Final Clinical Impressions(s) / ED Diagnoses   Final diagnoses:  None   1. Abdominal pain 2. Exacerbation of Ulcerative Colitis 3. Nausea and vomiting  ED Discharge Orders    None       Charlann Lange, Hershal Coria 82/99/37 1696    Delora Fuel, MD 78/93/81 [phone]

## 2018-05-26 NOTE — ED Triage Notes (Signed)
Pt arrived POV with abdominal pain x 2 days progressively worsening.  N&V + Diarrhea (PMHx Ulcerativ colitis)

## 2018-05-26 NOTE — ED Notes (Signed)
Patient ambulatory to bathroom with steady gait at this time 

## 2018-05-26 NOTE — ED Notes (Signed)
MSO4 given and pt almost instatnly began itching.  Itching lasted about 3 min. And has not returned since.

## 2018-05-26 NOTE — H&P (Signed)
Date: 05/26/2018               Patient Name:  Tariah Transue MRN: [id number]  DOB: 17-Jun-1960 Age / Sex: 58 y.o., female   PCP: Arta Silence, MD         Medical Service: Internal Medicine Teaching Service         Attending Physician: Dr. Rebeca Alert Raynaldo Opitz, MD    First Contact: Dr. Laural Golden Pager: [phone]  Second Contact: Dr. Tarri Abernethy Pager: [phone]       After Hours (After 5p/  First Contact Pager: [phone]  weekends / holidays): Second Contact Pager: [phone]   Chief Complaint: abdominal pain  History of Present Illness: Ms. Kenasia Scheller is a 58 year old female with past medical history of ulcerative colitis presenting with a two day history of abdominal pain, nausea and vomiting. She states she was in her usual state of health until Sunday when she started having severe abdominal pain. She states she has been unable to keep anything down since then. She says the pain worsens with movement and is mostly periumbilical. She has also noticed blood in her stool and when she wipes. She states at baseline she normally has some abdominal soreness and blood when she wipes but not to the extent she had Sunday. She was diagnosed with UC in 2010 and has not been on any medications for the past 3 years due to cost. She does not follow any GI doctor outpatient currently. She states she has these flare ups every few months. In the past she has come to the hospital for the pain and usually responds well to the pain medications. She feels her pain level has not improved at all since she has been in the ED.   ED course: On arrival she was hemodynamically stable. UA unremarkable. Lipase 29. CMP and CBC unremarkable. CT abdomen illustrated Mild circumferential wall thickening and mucosal enhancement about the rectosigmoid colon, which could reflect acute exacerbation of ulcerative colitis and/or proctitis. She was treated with zofran and dilaudid in the ED with minimum pain control.   Meds:  Current Meds    Medication Sig  . naproxen (NAPROSYN) 500 MG tablet Take 1 tablet (500 mg total) by mouth 2 (two) times daily with a meal. (Patient taking differently: Take 500 mg by mouth 2 (two) times daily as needed for moderate pain. )  . naproxen sodium (ALEVE) 220 MG tablet Take 440 mg daily as needed by mouth (pain).  Marland Kitchen omeprazole (PRILOSEC) 20 MG capsule Take 20 mg by mouth daily as needed (acid reflux).     Allergies: Allergies as of 05/26/2018 - Review Complete 05/26/2018  Allergen Reaction Noted  . Onion Anaphylaxis 07/03/2015  . Flagyl [metronidazole] Other (See Comments) 02/20/2012  . Metoclopramide hcl Other (See Comments) 01/24/2009   Past Medical History:  Diagnosis Date  . Ulcerative colitis     Family History:  Mother and sister- sarcoidosis  Family History  Problem Relation Age of Onset  . Hypertension Mother   . Stroke Mother   . Multiple sclerosis Sister     Social History:  Denies tobacco or illicit drug use. Drinks wine socially, approximately once a month.   Review of Systems: A complete ROS was negative except as per HPI.   Physical Exam: Blood pressure 116/74, pulse 65, temperature (S) (!) 96.9 F (36.1 C), temperature source Rectal, resp. rate 14, height 5' 7"  (1.702 m), weight 93.9 kg, last menstrual period 05/12/2013, SpO2 96 %.  Physical Exam  Constitutional: She is oriented to person, place, and time and well-developed, well-nourished, and in no distress.  Cardiovascular: Normal rate, regular rhythm and normal heart sounds.  No murmur heard. Pulmonary/Chest: Effort normal and breath sounds normal. No respiratory distress.  Abdominal: Soft. Bowel sounds are normal. She exhibits no distension. There is abdominal tenderness. There is guarding.  Diffuse TTP  Musculoskeletal: Normal range of motion.        General: No edema.  Neurological: She is alert and oriented to person, place, and time.  Skin: Skin is warm and dry.  Psychiatric: Memory, affect and  judgment normal.    EKG: n/a  CXR: n/a  CT abdomen IMPRESSION: 1. Mild circumferential wall thickening and mucosal enhancement about the rectosigmoid colon, which could reflect acute exacerbation of ulcerative colitis and/or proctitis. No associated complication. Associated mildly prominent perirectal lymph nodes are stable, and likely reactive. 2. Symmetric sclerosis about the SI joints bilaterally, compatible with chronic sacroiliitis, and most likely related to history of inflammatory bowel disease.   Assessment & Plan by Problem: Active Problems:   Ulcerative colitis, rectosigmoid, with rectal bleeding ([place])  Ms. Tamana Hatfield is a 58 year old female with past medical history of ulcerative colitis presenting with a two day history of abdominal pain, nausea and vomiting. CT abdomen consistent with acute exacerbation of UC.   Ulcerative Colitis Patient presenting with a UC flare. Lipase is normal and patient does not have a leukocytosis.  CT abdomen/pelvis shows mild circumferential wall thickening and mucosal enhancement about the rectosignoid colon.  Will treat with IV steroids - IV methylprednisolone 20 mg Q8H - GI consult - Follow up CRP - IV fluids - Phenergan q6h prn for nausea - Pain control with hydromorphone 1 mg q4h prn  Diet: Clear liquids, advance as tolerated DVT prophylaxis: Lovenox Full Code  Dispo: Admit patient to Inpatient with expected length of stay greater than 2 midnights.  SignedMike Craze, DO 05/26/2018, 12:35 PM  Pager: [phone]

## 2018-05-27 LAB — CBC
HCT: 38.9 % (ref 36.0–46.0)
Hemoglobin: 12.7 g/dL (ref 12.0–15.0)
MCH: 31.1 pg (ref 26.0–34.0)
MCHC: 32.6 g/dL (ref 30.0–36.0)
MCV: 95.1 fL (ref 80.0–100.0)
Platelets: 311 10*3/uL (ref 150–400)
RBC: 4.09 MIL/uL (ref 3.87–5.11)
RDW: 12.1 % (ref 11.5–15.5)
WBC: 10.9 10*3/uL — ABNORMAL HIGH (ref 4.0–10.5)
nRBC: 0 % (ref 0.0–0.2)

## 2018-05-27 LAB — C-REACTIVE PROTEIN: CRP: 1.1 mg/dL — ABNORMAL HIGH (ref <1.0)

## 2018-05-27 LAB — BASIC METABOLIC PANEL
Anion gap: 10 (ref 5–15)
BUN: 10 mg/dL (ref 6–20)
CO2: 23 mmol/L (ref 22–32)
Calcium: 9.3 mg/dL (ref 8.9–10.3)
Chloride: 107 mmol/L (ref 98–111)
Creatinine, Ser: 0.69 mg/dL (ref 0.44–1.00)
GFR calc Af Amer: >60 mL/min (ref >60)
GFR calc non Af Amer: >60 mL/min (ref >60)
Glucose, Bld: 130 mg/dL — ABNORMAL HIGH (ref 70–99)
Potassium: 3.8 mmol/L (ref 3.5–5.1)
Sodium: 140 mmol/L (ref 135–145)

## 2018-05-27 LAB — HIV ANTIBODY (ROUTINE TESTING W REFLEX): HIV Screen 4th Generation wRfx: NONREACTIVE

## 2018-05-27 NOTE — Consult Note (Addendum)
[facility]  Dr. Collene Mares and Dr. Benson Norway   HPI Crystal Benton is a 58 y.o f with ulcerative colitis involving rectosigmoid and bowel abscess diagnosed in 2010 and anemia who presented with 1 week history of upper abdominal pain, nausea, vomiting, chills, and diarrhea. Patient describes the abdominal pain as 8/10 intensity, diffuse, radiating to her back, and burning in nature. She states that she has been noticing minimal amount of blood in her stool over the past year, but over the past few days she has been noticing clots. She has been having similar symptoms every few months over the past 3 years. She has not lost any weight and in fact states that she has gained approximately 6lbs in the past month.  She was being followed by Dr. Paulita Fujita [facility]) for her ulcerative colitis and was being prescribed azulfidine 1000mg  qid (5-ASA therapy). However, due to financial constraints she has not been able to follow up with GI and take her medication for the last 3 years. No EGD or colonoscopies could be seen on Epic. Path report from 2010 rectosigmoid colon biopsy showed chronic active mucosal colitis consistent with UC.   Patient has been continuing to have nausea, vomiting, tenesmus, and diarrhea over the past 24 hrs. She has vomited once today but has been able to keep clear food down.   ROS  As noted above  Physical Exam Vitals:   05/27/18 0436 05/27/18 0918  BP: 136/83 (!) 105/58  Pulse: 65 67  Resp: 19 18  Temp: 98.3 F (36.8 C) 97.9 F (36.6 C)  SpO2: 96% 100%    Physical Exam  Constitutional: She appears well-developed and well-nourished. No distress.  HENT:  Head: Normocephalic and atraumatic.  Eyes: Conjunctivae are normal.  Cardiovascular: Normal rate, regular rhythm and normal heart sounds.  No murmur heard. Respiratory: Effort normal and breath sounds normal. No respiratory distress. She has no wheezes.  GI: Soft. Bowel sounds are normal. She exhibits no distension. There  is abdominal tenderness (diffuse, but more marked in epigastric region).  Musculoskeletal:        General: No edema.  Neurological: She is alert.  Skin: She is not diaphoretic. No erythema.  Psychiatric: She has a normal mood and affect. Her behavior is normal. Judgment and thought content normal.   Assessment and plan  Crystal Benton is a 58 y.o female with ulcerative colitis involving rectosigmoid and bowel abscess and anemia presenting with nausea, vomiting, diarrhea, and abdominal pain.  Ulcerative Colitis Flare CT abdomen showed mild circumferential wall thickening and mucosal enhancement with mild perirectal lymph nodes, symm sclerosis of bilateral SI joint. Patient's finding about the rectosigmoid colon suggestive of UC or proctitis.   Patient is stable-afebrile, mild leukocytosis (10.9), minimal elevation. Need to monitor for fever or worsening pain to determine if patient is having superimposed infection. Agree with starting glucocorticoids in setting of UC flare.  The patient's history of being on azulfidine, the mild nature of her symptoms, and the rectosigmoid location of UC make the patient a good candidate for 5-ASA therapy. She can be continued on IV solumedrol or changed to oral multimatrix budesonide in the acute setting and we can then transition her onto 5-ASA therapy long-term based on her current financial state.  We will consider doing colonoscopy to see if disease has spread when patient is more stable. Will do colonoscopy 05/28/18, clears and npo at midnight.  -continue anti-emetics -continue pain control with Dilaudid 1mg  q4h prn -make sure patient is getting  dvt prophylaxis (IBD pts more prone to coagulopathy) -Avoid NSAIDS  Lars Mage, MD Internal Medicine PGY2 LXBWI:[phone] 05/27/2018, 10:32 AM

## 2018-05-27 NOTE — Progress Notes (Addendum)
   Subjective: [name] reports that she had nausea and vomiting last night and this morning and has only been able to eat some jello today. She states that she doesn't usually have vomiting with her UC flares. She continues to have abdominal pain. She reports that the steroids make her feel woozy, so nursing has advised her to stay in bed for now.   Objective:  Vital signs in last 24 hours: Vitals:   05/26/18 1335 05/26/18 1628 05/26/18 2053 05/27/18 0436  BP: 112/68 130/72 (!) 146/79 136/83  Pulse: 70 72 79 65  Resp: 14 18 18 19   Temp:  98 F (36.7 C) 98.4 F (36.9 C) 98.3 F (36.8 C)  TempSrc:  Oral Oral Oral  SpO2:  99% 98% 96%  Weight:  99.5 kg    Height:  5\' 7"  (1.702 m)     Gen: laying comfortably in bed, no distress CV: RRR, no murmurs Abd: bowel sounds present, soft, mildly distended, tender to palpation particularly in the epigastric region, but also in the lower abdomen Ext: no edema  Assessment/Plan:  Active Problems:   Ulcerative colitis, rectosigmoid, with rectal bleeding ([place])  [name] is a 58 year old female with past medical history of ulcerative colitis who presented with a two day history of abdominal pain, nausea and vomiting. CT abdomen consistent with acute exacerbation of UC.   Ulcerative Colitis - Patient is continuing to have n/v and abdominal pain - Pathology report from 2010 stated -changes in the rectosigmoid biopsy are usually seen in ulcerative colitis. However, some of these changes may be mimicked by diverticulitis related inflammation - She states she was on oral azulfidine 1000 mg qid therapy til about 3 years ago when she stopped due to cost - No records of any colonoscopies  - WBC 10.9 - CRP 1.1, down from 1.5 - Will consult GI today - IV methylprednisolone 20 mg Q8H - IV fluids - Phenergan q6h prn for nausea - Pain control with hydromorphone 1 mg q4h prn  Dispo: Anticipated discharge pending clinical improvement.  Mike Craze, DO 05/27/2018, 6:47 AM Pager: [phone]

## 2018-05-28 DIAGNOSIS — E860 Dehydration: Secondary | ICD-10-CM | POA: Diagnosis present

## 2018-05-28 MED ORDER — MESALAMINE 1.2 G PO TBEC
1.2000 g | DELAYED_RELEASE_TABLET | Freq: Every day | ORAL | Status: DC
  Administered 2018-05-30: 1.2 g via ORAL
  Filled 2018-05-28 (×2): qty 1

## 2018-05-28 MED ORDER — PEG 3350-KCL-NA BICARB-NACL 420 G PO SOLR
4000.0000 mL | Freq: Once | ORAL | Status: AC
  Administered 2018-05-28: 4000 mL via ORAL
  Filled 2018-05-28: qty 4000

## 2018-05-28 MED ORDER — LACTATED RINGERS IV SOLN
INTRAVENOUS | Status: AC
  Administered 2018-05-28 – 2018-05-29 (×3): via INTRAVENOUS

## 2018-05-28 NOTE — Progress Notes (Addendum)
   Subjective: No overnight events. No further episodes of vomiting. No BMs since admission. She no longer feels woozy this morning. She endorses cramping in her calves. It was explained that she can eat today and will be made NPO at midnight for colonoscopy tomorrow. All her questions were addressed.  Objective:  Vital signs in last 24 hours: Vitals:   05/27/18 0918 05/27/18 1702 05/27/18 2053 05/28/18 0447  BP: (!) 105/58 136/79 131/87 134/77  Pulse: 67 71 77 (!) 55  Resp: 18 18 19 19   Temp: 97.9 F (36.6 C) (!) 97.5 F (36.4 C) 98.5 F (36.9 C) 98.2 F (36.8 C)  TempSrc: Oral Oral Oral Oral  SpO2: 100% 100% 100% 96%  Weight:   99.5 kg   Height:       Gen: laying comfortably in bed, no distress Abd: bowel sounds present, soft, non-distended, ttp in the epigastric region Ext: mild bilateral ankle edema  Assessment/Plan:  Principal Problem:   Ulcerative colitis, rectosigmoid, with rectal bleeding ([facility])  [name] is a 58 year old female with past medical history of ulcerative colitiswho presented with a two day history of abdominal pain, nausea and vomiting. CT abdomen consistent with acute exacerbation of UC.   Ulcerative Colitis - Patient scheduled for a colonoscopy tomorrow - Start mesalamine 1.2 gMS 2 po BID, per GI - IV methylprednisolone 20 mg Q8H - IV fluids -Phenerganq6h prn for nausea - Pain control with hydromorphone 1 mg q4h prn   Dispo: Anticipated discharge pending colonoscopy and clinical improvement.   Mike Craze, DO 05/28/2018, 6:44 AM Pager: [phone]

## 2018-05-28 NOTE — Progress Notes (Signed)
Subjective: No complaints.  Objective: Vital signs in last 24 hours: Temp:  [97.7 F (36.5 C)-98.5 F (36.9 C)] 97.8 F (36.6 C) (01/23 1654) Pulse Rate:  [53-77] 65 (01/23 1654) Resp:  [18-19] 18 (01/23 1654) BP: (131-136)/(69-87) 136/69 (01/23 1654) SpO2:  [96 %-100 %] 100 % (01/23 1654) Weight:  [99.5 kg] 99.5 kg (01/22 2053) Last BM Date: 05/26/18  Intake/Output from previous day: 01/22 0701 - 01/23 0700 In: 900 [P.O.:900] Out: 0  Intake/Output this shift: Total I/O In: 120 [P.O.:120] Out: 0   General appearance: alert and no distress GI: soft, non-tender; bowel sounds normal; no masses,  no organomegaly  Lab Results: Recent Labs    05/26/18 0247 05/27/18 0446  WBC 5.7 10.9*  HGB 13.6 12.7  HCT 40.1 38.9  PLT 283 311   BMET Recent Labs    05/26/18 0247 05/27/18 0446  NA 140 140  K 3.6 3.8  CL 108 107  CO2 23 23  GLUCOSE 109* 130*  BUN 17 10  CREATININE 0.83 0.69  CALCIUM 9.2 9.3   LFT Recent Labs    05/26/18 0247  PROT 6.8  ALBUMIN 3.8  AST 19  ALT 24  ALKPHOS 89  BILITOT 0.4   PT/INR No results for input(s): LABPROT, INR in the last 72 hours. Hepatitis Panel No results for input(s): HEPBSAG, HCVAB, HEPAIGM, HEPBIGM in the last 72 hours. C-Diff No results for input(s): CDIFFTOX in the last 72 hours. Fecal Lactopherrin No results for input(s): FECLLACTOFRN in the last 72 hours.  Studies/Results: No results found.  Medications:  Scheduled: . enoxaparin (LOVENOX) injection  40 mg Subcutaneous Q24H  . hydrocortisone  25 mg Rectal Once  . [START ON 05/29/2018] mesalamine  1.2 g Oral Q breakfast  . methylPREDNISolone (SOLU-MEDROL) injection  20 mg Intravenous Q8H   Continuous: . lactated ringers 100 mL/hr at 05/28/18 1520    Assessment/Plan: 1) Ulcerative colitis. 2) Diarrhea.  Plan: 1) Colonoscopy tomorrow.  LOS: 2 days   Crystal Benton D 05/28/2018, 5:50 PM

## 2018-05-28 NOTE — Care Management Note (Addendum)
Case Management Note  Patient Details  Name: Crystal Benton MRN: [id number] Date of Birth: [birth year]  Subjective/Objective:                 UC flare, colonoscopy pending   Action/Plan:  Spoke w patient at bedside. She states that she lives at home with her 57 yo son. She states she has no barriers to care, is insured. Discussed PCP. She will access [employer] site, or call number on card for in network providers to establish primary care with. She understands that she will follow up w GI after DC as well.  No CM needs identified at this time.  Expected Discharge Date:                  Expected Discharge Plan:  Home/Self Care  In-House Referral:     Discharge planning Services  CM Consult  Post Acute Care Choice:    Choice offered to:     DME Arranged:    DME Agency:     HH Arranged:    HH Agency:     Status of Service:  In process, will continue to follow  If discussed at Long Length of Stay Meetings, dates discussed:    Additional Comments:  Carles Collet, RN 05/28/2018, 11:34 AM

## 2018-05-29 ENCOUNTER — Inpatient Hospital Stay (HOSPITAL_COMMUNITY): Payer: BLUE CROSS/BLUE SHIELD | Admitting: Certified Registered"

## 2018-05-29 ENCOUNTER — Encounter (HOSPITAL_COMMUNITY)
Admission: EM | Disposition: A | Discharge status: Home / Self Care | Payer: Self-pay | Source: Home / Self Care | Attending: Internal Medicine

## 2018-05-29 ENCOUNTER — Encounter (HOSPITAL_COMMUNITY): Payer: Self-pay | Admitting: *Deleted

## 2018-05-29 DIAGNOSIS — K51211 Ulcerative (chronic) proctitis with rectal bleeding: Secondary | ICD-10-CM

## 2018-05-29 DIAGNOSIS — K51311 Ulcerative (chronic) rectosigmoiditis with rectal bleeding: Principal | ICD-10-CM

## 2018-05-29 DIAGNOSIS — K635 Polyp of colon: Secondary | ICD-10-CM

## 2018-05-29 HISTORY — PX: POLYPECTOMY: SHX5525

## 2018-05-29 HISTORY — PX: BIOPSY: SHX5522

## 2018-05-29 HISTORY — PX: COLONOSCOPY WITH PROPOFOL: SHX5780

## 2018-05-29 SURGERY — COLONOSCOPY WITH PROPOFOL
Anesthesia: Monitor Anesthesia Care

## 2018-05-29 MED ORDER — LACTATED RINGERS IV SOLN
INTRAVENOUS | Status: DC | PRN
  Administered 2018-05-29: 12:00:00 via INTRAVENOUS

## 2018-05-29 MED ORDER — SODIUM CHLORIDE 0.9 % IV SOLN: INTRAVENOUS | Status: DC

## 2018-05-29 MED ORDER — PROPOFOL 500 MG/50ML IV EMUL
INTRAVENOUS | Status: DC | PRN
  Administered 2018-05-29: 75 ug/kg/min via INTRAVENOUS

## 2018-05-29 MED ORDER — PROPOFOL 10 MG/ML IV BOLUS
INTRAVENOUS | Status: DC | PRN
  Administered 2018-05-29: 30 mg via INTRAVENOUS
  Administered 2018-05-29: 20 mg via INTRAVENOUS

## 2018-05-29 MED ORDER — LIDOCAINE 2% (20 MG/ML) 5 ML SYRINGE
INTRAMUSCULAR | Status: DC | PRN
  Administered 2018-05-29: 40 mg via INTRAVENOUS

## 2018-05-29 SURGICAL SUPPLY — 21 items

## 2018-05-29 NOTE — Anesthesia Preprocedure Evaluation (Signed)
Anesthesia Evaluation  Patient identified by MRN, date of birth, ID band Patient awake    Reviewed: Allergy & Precautions, H&P , NPO status , Patient's Chart, lab work & pertinent test results, reviewed documented beta blocker date and time   Airway Mallampati: II  TM Distance: >3 FB Neck ROM: full    Dental no notable dental hx.    Pulmonary neg pulmonary ROS,    Pulmonary exam normal breath sounds clear to auscultation       Cardiovascular Exercise Tolerance: Good negative cardio ROS   Rhythm:regular Rate:Normal     Neuro/Psych negative neurological ROS  negative psych ROS   GI/Hepatic Neg liver ROS, PUD,   Endo/Other  negative endocrine ROS  Renal/GU negative Renal ROS  negative genitourinary   Musculoskeletal negative musculoskeletal ROS (+)   Abdominal   Peds negative pediatric ROS (+)  Hematology  (+) Blood dyscrasia, anemia ,   Anesthesia Other Findings   Reproductive/Obstetrics negative OB ROS                             Anesthesia Physical Anesthesia Plan  ASA: II  Anesthesia Plan: MAC   Post-op Pain Management:    Induction: Intravenous  PONV Risk Score and Plan: 2 and Treatment may vary due to age or medical condition  Airway Management Planned: Mask and Natural Airway  Additional Equipment:   Intra-op Plan:   Post-operative Plan:   Informed Consent: I have reviewed the patients History and Physical, chart, labs and discussed the procedure including the risks, benefits and alternatives for the proposed anesthesia with the patient or authorized representative who has indicated his/her understanding and acceptance.     Dental Advisory Given  Plan Discussed with: CRNA, Anesthesiologist and Surgeon  Anesthesia Plan Comments:         Anesthesia Quick Evaluation

## 2018-05-29 NOTE — Op Note (Signed)
[facility] Patient Name: Crystal Benton Procedure Date : 05/29/2018 MRN: [id number] Attending MD: Carol Ada , MD Date of Birth: 22-Sep-1960 CSN: [id number] Age: 58 Admit Type: Inpatient Procedure:                Colonoscopy Indications:              Generalized abdominal pain Providers:                Carol Ada, MD, Carmie End, RN, Cherylynn Ridges, Technician, Sampson Si, CRNA Referring MD:              Medicines:                Propofol per Anesthesia Complications:            No immediate complications. Estimated Blood Loss:     Estimated blood loss was minimal. Procedure:                Pre-Anesthesia Assessment:                           - Prior to the procedure, a History and Physical                            was performed, and patient medications and                            allergies were reviewed. The patient's tolerance of                            previous anesthesia was also reviewed. The risks                            and benefits of the procedure and the sedation                            options and risks were discussed with the patient.                            All questions were answered, and informed consent                            was obtained. Prior Anticoagulants: The patient has                            taken no previous anticoagulant or antiplatelet                            agents. ASA Grade Assessment: II - A patient with                            mild systemic disease. After reviewing the risks  and benefits, the patient was deemed in                            satisfactory condition to undergo the procedure.                           - Sedation was administered by an anesthesia                            professional. Deep sedation was attained.                           After obtaining informed consent, the colonoscope                            was passed under  direct vision. Throughout the                            procedure, the patient's blood pressure, pulse, and                            oxygen saturations were monitored continuously. The                            CF-HQ190L ([id number] Olympus colonoscope was                            introduced through the anus and advanced to the the                            cecum, identified by appendiceal orifice and                            ileocecal valve. The colonoscopy was technically                            difficult and complex due to poor bowel prep.                            Successful completion of the procedure was aided by                            lavage. The patient tolerated the procedure well.                            The quality of the bowel preparation was good. The                            ileocecal valve, appendiceal orifice, and rectum                            were photographed. Scope In: 12:03:10 PM Scope Out: 12:33:43 PM Scope Withdrawal Time: 0 hours 18 minutes 19 seconds  Total Procedure Duration: 0 hours 30 minutes  33 seconds  Findings:      A 3 mm polyp was found in the cecum. The polyp was sessile. The polyp       was removed with a cold snare. Resection and retrieval were complete.      Inflammation characterized by erosions and granularity was found in a       continuous and circumferential pattern from the anus to the rectum. This       was moderate in severity. Biopsies were taken with a cold forceps for       histology.      The proctitis extended from the dentate line up to 10 cm proximally.       Cold biopsies were obtained. Impression:               - One 3 mm polyp in the cecum, removed with a cold                            snare. Resected and retrieved.                           - Proctitis. Inflammation was found from the anus                            to the rectum. This was moderate in severity.                             Biopsied. Recommendation:           - Return patient to hospital ward for ongoing care.                           - Resume regular diet.                           - Continue present medications.                           - Canasa 1000 mg PR QHS.                           - Follow up with Dr. Collene Mares in one month.                           - Signing off. Procedure Code(s):        --- Professional ---                           [phone], Colonoscopy, flexible; with removal of                            tumor(s), polyp(s), or other lesion(s) by snare                            technique                           14970, 97, Colonoscopy, flexible; with biopsy,  single or multiple Diagnosis Code(s):        --- Professional ---                           D12.0, Benign neoplasm of cecum                           K52.9, Noninfective gastroenteritis and colitis,                            unspecified                           R10.84, Generalized abdominal pain CPT copyright 2018 American Medical Association. All rights reserved. The codes documented in this report are preliminary and upon coder review may  be revised to meet current compliance requirements. Carol Ada, MD Carol Ada, MD 05/29/2018 12:41:33 PM This report has been signed electronically. Number of Addenda: 0

## 2018-05-29 NOTE — Anesthesia Postprocedure Evaluation (Signed)
Anesthesia Post Note  Patient: Arlene Brickel  Procedure(s) Performed: COLONOSCOPY WITH PROPOFOL (N/A ) BIOPSY     Patient location during evaluation: PACU Anesthesia Type: MAC Level of consciousness: awake and alert Pain management: pain level controlled Vital Signs Assessment: post-procedure vital signs reviewed and stable Respiratory status: spontaneous breathing, nonlabored ventilation, respiratory function stable and patient connected to nasal cannula oxygen Cardiovascular status: stable and blood pressure returned to baseline Postop Assessment: no apparent nausea or vomiting Anesthetic complications: no    Last Vitals:  Vitals:   05/29/18 1245 05/29/18 1316  BP: (!) 172/96 (!) 149/83  Pulse:  (!) 56  Resp:  17  Temp:  36.5 C  SpO2:  99%    Last Pain:  Vitals:   05/29/18 1352  TempSrc:   PainSc: 4                  Gaje Tennyson

## 2018-05-29 NOTE — Progress Notes (Addendum)
   Subjective: Patient was evaluated after her colonoscopy today. She reported soreness in her rectum and abdomen. She said her abdominal pain has improved since yesterday, it is a 7/10. She felt nauseated from the colonoscopy prep. She feels very hungry today. She continues to notice a lot of blood in her stool. All questions and concerns addressed.  Objective:  Vital signs in last 24 hours: Vitals:   05/28/18 0915 05/28/18 1654 05/28/18 2153 05/29/18 0515  BP: 135/70 136/69 137/74 (!) 163/76  Pulse: (!) 53 65 (!) 58 (!) 58  Resp: 18 18 20 20   Temp: 97.7 F (36.5 C) 97.8 F (36.6 C) 98.2 F (36.8 C) 97.9 F (36.6 C)  TempSrc: Oral Oral Oral Oral  SpO2: 97% 100% 97% 100%  Weight:      Height:       Gen: seen laying in bed comfortably, nad  Abdomen: TTP in all four quadrants, greatest in the epigastric region, bowel sounds present, no distension Extremities: no edema  Assessment/Plan:  Principal Problem:   Ulcerative colitis, rectosigmoid, with rectal bleeding (HCC) Active Problems:   Dehydration  [name] is a 58 year old female with past medical history of ulcerative colitiswho presentedwith a two day history of abdominal pain, nausea and vomiting. CT abdomen consistent with acute exacerbation of UC.   Ulcerative Colitis - Patient had a colonoscopy today - Patient found to have one sessile polyp in the cecum which was removed, inflammation characterized by erosions and granularity was found in a continuous and circumferential pattern from the anus to the rectum. This was moderate in severity. Biopsies were taken with a cold forceps for histology The proctitis extended from the dentate line up to 10 cm proximally. Cold biopsies were obtained - She is to follow up with Dr. Collene Mares in one month - Continue mesalamine 1.2 g BID - IV methylprednisolone 20 mg Q8H - IV fluids -Phenerganq6h prn for nausea - Pain control with hydromorphone 1 mg q4h prn  Dispo: Anticipated  discharge in approximately 1-2 day(s).   Mike Craze, DO 05/29/2018, 6:47 AM Pager: [phone]

## 2018-05-29 NOTE — Anesthesia Procedure Notes (Signed)
Date/Time: 05/29/2018 12:00 PM Performed by: Trinna Post., CRNA Pre-anesthesia Checklist: Patient identified, Emergency Drugs available, Suction available, Patient being monitored and Timeout performed Patient Re-evaluated:Patient Re-evaluated prior to induction Oxygen Delivery Method: Nasal cannula Preoxygenation: Pre-oxygenation with 100% oxygen Induction Type: IV induction Placement Confirmation: positive ETCO2

## 2018-05-29 NOTE — Transfer of Care (Signed)
Immediate Anesthesia Transfer of Care Note  Patient: Crystal Benton  Procedure(s) Performed: COLONOSCOPY WITH PROPOFOL (N/A ) BIOPSY  Patient Location: Endoscopy Unit  Anesthesia Type:MAC  Level of Consciousness: lethargic and responds to stimulation  Airway & Oxygen Therapy: Patient Spontanous Breathing  Post-op Assessment: Report given to RN  Post vital signs: Reviewed and stable  Last Vitals:  Vitals Value Taken Time  BP 136/68 05/29/2018 12:38 PM  Temp    Pulse 58 05/29/2018 12:39 PM  Resp 18 05/29/2018 12:39 PM  SpO2 98 % 05/29/2018 12:39 PM  Vitals shown include unvalidated device data.  Last Pain:  Vitals:   05/29/18 1109  TempSrc: Oral  PainSc: 10-Worst pain ever      Patients Stated Pain Goal: 3 (10/27/74 2831)  Complications: No apparent anesthesia complications

## 2018-05-30 DIAGNOSIS — Z86010 Personal history of colonic polyps: Secondary | ICD-10-CM

## 2018-05-30 DIAGNOSIS — Z79899 Other long term (current) drug therapy: Secondary | ICD-10-CM

## 2018-05-30 MED ORDER — MESALAMINE 1000 MG RE SUPP: 1000.0000 mg | Freq: Every day | RECTAL | 12 refills | Status: DC

## 2018-05-30 MED ORDER — OXYCODONE HCL 5 MG PO TABS: 5.0000 mg | ORAL_TABLET | ORAL | Status: DC | PRN

## 2018-05-30 MED ORDER — OXYCODONE HCL 5 MG PO TABS: 5.0000 mg | ORAL_TABLET | Freq: Four times a day (QID) | ORAL | 0 refills | Status: AC | PRN

## 2018-05-30 MED ORDER — CALCIUM CARBONATE ANTACID 500 MG PO CHEW
1.0000 | CHEWABLE_TABLET | Freq: Two times a day (BID) | ORAL | Status: DC
  Administered 2018-05-30: 200 mg via ORAL
  Filled 2018-05-30: qty 1

## 2018-05-30 NOTE — Progress Notes (Signed)
   Subjective: [name] reports she is doing well this morning. Denies any vomiting but endorses nausea. She tolerated PO intake yesterday but said once she started feeling nauseated she stopped eating. She is having acid reflux. Abdominal pain is about the same.   Objective:  Vital signs in last 24 hours: Vitals:   05/29/18 1316 05/29/18 1732 05/29/18 2217 05/30/18 0533  BP: (!) 149/83 132/67 133/70 128/74  Pulse: (!) 56 75 (!) 56 (!) 57  Resp: 17 19 16 16   Temp: 97.7 F (36.5 C) 98 F (36.7 C) 98.1 F (36.7 C) 97.8 F (36.6 C)  TempSrc: Oral Oral Oral Oral  SpO2: 99% 98% 98% 98%  Weight:      Height:       Gen: seen comfortably lying in bed, no distress Abdomen: TTP in all four quadrants, no distension  Assessment/Plan:  Principal Problem:   Ulcerative colitis, rectosigmoid, with rectal bleeding (HCC) Active Problems:   Dehydration  [name] is a 58 year old female with past medical history of ulcerative colitiswho presentedwith a two day history of abdominal pain, nausea and vomiting. CT abdomen consistent with acute exacerbation of UC.   Ulcerative Colitis -Patient had a colonoscopy yesterday, no complications - She is tolerating PO intake  - She is to follow up with Dr. Collene Mares in one month - Per GI, she does not need any more steroids, just to continue on Mesalamine 1000 mg qhs   Dispo: Patient is medically stable for discharge today.  Mike Craze, DO 05/30/2018, 6:45 AM Pager: [phone]

## 2018-05-30 NOTE — Discharge Summary (Signed)
Name: Crystal Benton MRN: [id number] DOB: 1960-11-23 58 y.o. PCP: Arta Silence, MD  Date of Admission: 05/26/2018  2:29 AM Date of Discharge: 05/30/2018 Attending Physician: Sid Falcon, MD  Discharge Diagnosis: 1. Ulcerative Colitis   Discharge Medications: Allergies as of 05/30/2018      Reactions   Onion Anaphylaxis   Flagyl [metronidazole] Other (See Comments)   Makes her feel like she's dying   Metoclopramide Hcl Other (See Comments)   cramping      Medication List    STOP taking these medications   amoxicillin-clavulanate 875-125 MG tablet Commonly known as:  AUGMENTIN   azithromycin 250 MG tablet Commonly known as:  ZITHROMAX   fluticasone 50 MCG/ACT nasal spray Commonly known as:  FLONASE   loratadine 10 MG tablet Commonly known as:  CLARITIN   naproxen 500 MG tablet Commonly known as:  NAPROSYN   naproxen sodium 220 MG tablet Commonly known as:  ALEVE   ondansetron 4 MG disintegrating tablet Commonly known as:  ZOFRAN ODT   phenol 1.4 % Liqd Commonly known as:  CHLORASEPTIC   predniSONE 20 MG tablet Commonly known as:  DELTASONE     TAKE these medications   mesalamine 1000 MG suppository Commonly known as:  CANASA Place 1 suppository (1,000 mg total) rectally at bedtime.   mesalamine 1000 MG suppository Commonly known as:  CANASA Place 1 suppository (1,000 mg total) rectally at bedtime.   omeprazole 20 MG capsule Commonly known as:  PRILOSEC Take 20 mg by mouth daily as needed (acid reflux).   oxyCODONE 5 MG immediate release tablet Commonly known as:  Oxy IR/ROXICODONE Take 1 tablet (5 mg total) by mouth every 6 (six) hours as needed for up to 3 days for moderate pain.       Disposition and follow-up:   Ms.Crystal Benton was discharged from [facility] in Stable condition.  At the hospital follow up visit please address:  1.  Ulcerative colitis- patient discharged to continue on mesalamine suppositories, please make  sure patient has been able to obtain this medication and been compliant   2.  Labs / imaging needed at time of follow-up: none  3.  Pending labs/ test needing follow-up: biopsies from colonoscopy   Follow-up Appointments: Follow-up Information    Juanita Craver, MD. Schedule an appointment as soon as possible for a visit in 4 week(s).   Specialty:  Gastroenterology Contact information: [address], [place] [address] [place] Follow up.   Contact information: [address] [place] [facility] Course by problem list: 1. Ulcerative colitis- Presented with a two day history of abdominal pain, nausea and vomiting. CT abdomen was consistent with acute exacerbation of UC. She had a colonoscopy on 05/29/18 and found to have one sessile polyp in the cecum which was removed, inflammation characterized by erosions and granularity was found in a continuous and circumferential pattern from the anus to the rectum. This was moderate in severity. Biopsies were taken with a cold forceps for histology. The proctitis extended from the dentate line up to 10 cm proximally. Cold biopsies were obtained. She is to follow up with GI in one month and to continue mesalamine 1000 mg suppositories qhs.   Discharge Vitals:   BP (!) 134/58 (BP Location: Left Arm)   Pulse 69   Temp 98  F (36.7 C) (Oral)   Resp 19   Ht 5\' 7"  (1.702 m)   Wt 99.5 kg   LMP 05/12/2013   SpO2 97%   BMI 34.36 kg/m   Pertinent Labs, Studies, and Procedures:   CBC Latest Ref Rng & Units 05/27/2018 05/26/2018 07/30/2017  WBC 4.0 - 10.5 K/uL 10.9(H) 5.7 6.4  Hemoglobin 12.0 - 15.0 g/dL 12.7 13.6 14.7  Hematocrit 36.0 - 46.0 % 38.9 40.1 43.1  Platelets 150 - 400 K/uL 311 283 250   Ct Abdomen Pelvis W Contrast  Result Date: 05/26/2018 CLINICAL DATA:  Initial evaluation for worsening abdominal pain with nausea,  vomiting, diarrhea. History of ulcerative colitis. EXAM: CT ABDOMEN AND PELVIS WITH CONTRAST TECHNIQUE: Multidetector CT imaging of the abdomen and pelvis was performed using the standard protocol following bolus administration of intravenous contrast. CONTRAST:  186mL OMNIPAQUE IOHEXOL 300 MG/ML  SOLN COMPARISON:  Prior CT from 03/14/2017 FINDINGS: Lower chest: Subsegmental atelectatic changes seen dependently within the visualized lung bases. Visualized lungs are otherwise clear. Hepatobiliary: Liver demonstrates a normal contrast enhanced appearance. Gallbladder within normal limits. No biliary dilatation. Pancreas: Pancreas within normal limits. Spleen: Spleen within normal limits. Adrenals/Urinary Tract: Adrenal glands are normal. Kidneys equal in size with symmetric enhancement. No nephrolithiasis, hydronephrosis or focal enhancing renal mass. Subcentimeter hypodensity at the lower pole left kidney too small the characterize, but statistically likely reflects a small cyst. No hydroureter. Bladder within normal limits. Stomach/Bowel: Stomach within normal limits. No evidence for bowel obstruction. Normal appendix. Mild circumferential wall thickening with mucosal enhancement about the rectosigmoid colon and rectum, which could reflect acute colitis related to history of ulcerative colitis (series 3, image 79). No other acute inflammatory changes seen about the bowels. Vascular/Lymphatic: Normal intravascular enhancement seen throughout the intra-abdominal aorta. Mesenteric vessels patent proximally. Few scattered mildly prominent perirectal lymph nodes again noted, similar to previous. No other adenopathy. Reproductive: Uterus and ovaries within normal limits. Other: No free air or fluid. Musculoskeletal: Symmetric sclerosis noted about the SI joints bilaterally, compatible with psoriatic arthritis, likely related to inflammatory bowel disease. No acute osseous abnormality. No discrete lytic or blastic osseous  lesions. IMPRESSION: 1. Mild circumferential wall thickening and mucosal enhancement about the rectosigmoid colon, which could reflect acute exacerbation of ulcerative colitis and/or proctitis. No associated complication. Associated mildly prominent perirectal lymph nodes are stable, and likely reactive. 2. Symmetric sclerosis about the SI joints bilaterally, compatible with chronic sacroiliitis, and most likely related to history of inflammatory bowel disease. Electronically Signed   By: Jeannine Boga M.D.   On: 05/26/2018 06:08   Discharge Instructions: Discharge Instructions    Diet - low sodium heart healthy   Complete by:  As directed    Increase activity slowly   Complete by:  As directed      [name],  You were hospitalized due to an ulcerative colitis flare up. GI wants you to continue taking a Mesalamine suppository, 1000 mg daily. I have sent a prescription to your [facility] pharmacy. Please follow with GI in one month. I also want you to follow up with our clinic in one week. They will call you to set up an appointment time.   Thank you for allowing [place] to be a part of your care!  Signed: Mike Craze, DO 05/30/2018, 11:18 AM

## 2018-05-30 NOTE — Progress Notes (Signed)
Crystal Benton to be D/C'd Home per MD order.  Discussed prescriptions and follow up appointments with the patient. Prescriptions given to patient, medication list explained in detail. Pt verbalized understanding.  Allergies as of 05/30/2018      Reactions   Onion Anaphylaxis   Flagyl [metronidazole] Other (See Comments)   Makes her feel like she's dying   Metoclopramide Hcl Other (See Comments)   cramping      Medication List    STOP taking these medications   amoxicillin-clavulanate 875-125 MG tablet Commonly known as:  AUGMENTIN   azithromycin 250 MG tablet Commonly known as:  ZITHROMAX   fluticasone 50 MCG/ACT nasal spray Commonly known as:  FLONASE   loratadine 10 MG tablet Commonly known as:  CLARITIN   naproxen 500 MG tablet Commonly known as:  NAPROSYN   naproxen sodium 220 MG tablet Commonly known as:  ALEVE   ondansetron 4 MG disintegrating tablet Commonly known as:  ZOFRAN ODT   phenol 1.4 % Liqd Commonly known as:  CHLORASEPTIC   predniSONE 20 MG tablet Commonly known as:  DELTASONE     TAKE these medications   mesalamine 1000 MG suppository Commonly known as:  CANASA Place 1 suppository (1,000 mg total) rectally at bedtime.   mesalamine 1000 MG suppository Commonly known as:  CANASA Place 1 suppository (1,000 mg total) rectally at bedtime.   omeprazole 20 MG capsule Commonly known as:  PRILOSEC Take 20 mg by mouth daily as needed (acid reflux).   oxyCODONE 5 MG immediate release tablet Commonly known as:  Oxy IR/ROXICODONE Take 1 tablet (5 mg total) by mouth every 6 (six) hours as needed for up to 3 days for moderate pain.       Vitals:   05/30/18 0533 05/30/18 0849  BP: 128/74 (!) 134/58  Pulse: (!) 57 69  Resp: 16 19  Temp: 97.8 F (36.6 C) 98 F (36.7 C)  SpO2: 98% 97%    Skin clean, dry and intact without evidence of skin break down, no evidence of skin tears noted. IV catheter discontinued intact. Site without signs and symptoms  of complications. Dressing and pressure applied. Pt denies pain at this time. No complaints noted.  An After Visit Summary was printed and given to the patient. Patient escorted via [place], and D/C home via private auto.

## 2018-05-30 NOTE — Discharge Instructions (Signed)
[name], Rey were hospitalized due to an ulcerative colitis flare up. GI wants you to continue taking a Mesalamine suppository, 1000 mg daily. I have sent a prescription to your [facility] pharmacy. Please follow with GI in one month. I also want you to follow up with our clinic in one week. They will call you to set up an appointment time.   Thank you for allowing [place] to be a part of your care!

## 2018-05-31 ENCOUNTER — Encounter (HOSPITAL_COMMUNITY): Payer: Self-pay | Admitting: Gastroenterology

## 2018-09-17 ENCOUNTER — Emergency Department (HOSPITAL_COMMUNITY): Payer: BLUE CROSS/BLUE SHIELD

## 2018-09-17 ENCOUNTER — Emergency Department (HOSPITAL_COMMUNITY)
Admission: EM | Admit: 2018-09-17 | Discharge: 2018-09-17 | Disposition: A | Discharge status: Home / Self Care | Payer: BLUE CROSS/BLUE SHIELD | Attending: Emergency Medicine | Admitting: Emergency Medicine

## 2018-09-17 ENCOUNTER — Other Ambulatory Visit: Payer: Self-pay

## 2018-09-17 ENCOUNTER — Encounter (HOSPITAL_COMMUNITY): Payer: Self-pay

## 2018-09-17 DIAGNOSIS — R1013 Epigastric pain: Secondary | ICD-10-CM | POA: Diagnosis present

## 2018-09-17 DIAGNOSIS — K5190 Ulcerative colitis, unspecified, without complications: Secondary | ICD-10-CM | POA: Insufficient documentation

## 2018-09-17 LAB — COMPREHENSIVE METABOLIC PANEL
ALT: 21 U/L (ref 0–44)
AST: 21 U/L (ref 15–41)
Albumin: 4.4 g/dL (ref 3.5–5.0)
Alkaline Phosphatase: 116 U/L (ref 38–126)
Anion gap: 10 (ref 5–15)
BUN: 19 mg/dL (ref 6–20)
CO2: 24 mmol/L (ref 22–32)
Calcium: 9.5 mg/dL (ref 8.9–10.3)
Chloride: 107 mmol/L (ref 98–111)
Creatinine, Ser: 0.74 mg/dL (ref 0.44–1.00)
GFR calc Af Amer: >60 mL/min (ref >60)
GFR calc non Af Amer: >60 mL/min (ref >60)
Glucose, Bld: 112 mg/dL — ABNORMAL HIGH (ref 70–99)
Potassium: 3.6 mmol/L (ref 3.5–5.1)
Sodium: 141 mmol/L (ref 135–145)
Total Bilirubin: 0.5 mg/dL (ref 0.3–1.2)
Total Protein: 7.5 g/dL (ref 6.5–8.1)

## 2018-09-17 LAB — CBC WITH DIFFERENTIAL/PLATELET
Abs Immature Granulocytes: 0.03 10*3/uL (ref 0.00–0.07)
Basophils Absolute: 0 10*3/uL (ref 0.0–0.1)
Basophils Relative: 0 %
Eosinophils Absolute: 0.1 10*3/uL (ref 0.0–0.5)
Eosinophils Relative: 1 %
HCT: 41.7 % (ref 36.0–46.0)
Hemoglobin: 13.6 g/dL (ref 12.0–15.0)
Immature Granulocytes: 0 %
Lymphocytes Relative: 8 %
Lymphs Abs: 0.8 10*3/uL (ref 0.7–4.0)
MCH: 31.7 pg (ref 26.0–34.0)
MCHC: 32.6 g/dL (ref 30.0–36.0)
MCV: 97.2 fL (ref 80.0–100.0)
Monocytes Absolute: 0.1 10*3/uL (ref 0.1–1.0)
Monocytes Relative: 1 %
Neutro Abs: 9.1 10*3/uL — ABNORMAL HIGH (ref 1.7–7.7)
Neutrophils Relative %: 90 %
Platelets: 238 10*3/uL (ref 150–400)
RBC: 4.29 MIL/uL (ref 3.87–5.11)
RDW: 12.9 % (ref 11.5–15.5)
WBC: 10 10*3/uL (ref 4.0–10.5)
nRBC: 0 % (ref 0.0–0.2)

## 2018-09-17 LAB — URINALYSIS, ROUTINE W REFLEX MICROSCOPIC
Bilirubin Urine: NEGATIVE
Glucose, UA: NEGATIVE mg/dL
Hgb urine dipstick: NEGATIVE
Ketones, ur: NEGATIVE mg/dL
Leukocytes,Ua: NEGATIVE
Nitrite: NEGATIVE
Protein, ur: NEGATIVE mg/dL
Specific Gravity, Urine: 1.017 (ref 1.005–1.030)
pH: 6 (ref 5.0–8.0)

## 2018-09-17 LAB — LIPASE, BLOOD: Lipase: 31 U/L (ref 11–51)

## 2018-09-17 MED ORDER — HYDROCODONE-ACETAMINOPHEN 5-325 MG PO TABS: 1.0000 | ORAL_TABLET | Freq: Four times a day (QID) | ORAL | 0 refills | Status: DC | PRN

## 2018-09-17 MED ORDER — ONDANSETRON HCL 4 MG/2ML IJ SOLN
4.0000 mg | Freq: Once | INTRAMUSCULAR | Status: AC
  Administered 2018-09-17: 4 mg via INTRAVENOUS
  Filled 2018-09-17: qty 2

## 2018-09-17 MED ORDER — METHYLPREDNISOLONE SODIUM SUCC 125 MG IJ SOLR
125.0000 mg | Freq: Once | INTRAMUSCULAR | Status: AC
  Administered 2018-09-17: 05:00:00 125 mg via INTRAVENOUS
  Filled 2018-09-17: qty 2

## 2018-09-17 MED ORDER — SODIUM CHLORIDE (PF) 0.9 % IJ SOLN
INTRAMUSCULAR | Status: AC
  Filled 2018-09-17: qty 50

## 2018-09-17 MED ORDER — SODIUM CHLORIDE 0.9 % IV BOLUS
1000.0000 mL | Freq: Once | INTRAVENOUS | Status: AC
  Administered 2018-09-17: 03:00:00 1000 mL via INTRAVENOUS

## 2018-09-17 MED ORDER — MORPHINE SULFATE (PF) 4 MG/ML IV SOLN
4.0000 mg | Freq: Once | INTRAVENOUS | Status: AC
  Administered 2018-09-17: 03:00:00 4 mg via INTRAVENOUS
  Filled 2018-09-17: qty 1

## 2018-09-17 MED ORDER — PREDNISONE 10 MG PO TABS: ORAL_TABLET | ORAL | 0 refills | Status: DC

## 2018-09-17 MED ORDER — IOHEXOL 300 MG/ML  SOLN
100.0000 mL | Freq: Once | INTRAMUSCULAR | Status: AC | PRN
  Administered 2018-09-17: 100 mL via INTRAVENOUS

## 2018-09-17 NOTE — ED Notes (Signed)
Pt resting in bed. Reports being much more comfortable and feeling relief after the medicine. NAD

## 2018-09-17 NOTE — ED Notes (Signed)
Pt given and verbalized understanding of d/c instructions and need for follow up with pcp and gastroenterology. Told to return if s/s worsen. No further distress or questions at time of ambulation out of department.

## 2018-09-17 NOTE — ED Notes (Signed)
Pt changed into gown and given warm blanket. Pt has call bell within reach

## 2018-09-17 NOTE — Discharge Instructions (Addendum)
Begin taking prednisone as prescribed.  Begin taking hydrocodone as prescribed this evening as needed for pain.  Follow-up with your gastroenterologist if your symptoms are not improving in the next 3 to 4 days, and return to the ER if symptoms significantly worsen or change.

## 2018-09-17 NOTE — ED Notes (Signed)
Pt ambulatory to BR with steady gait.

## 2018-09-17 NOTE — ED Notes (Signed)
Pt resting. Reports still feeling much better and is able to rest now. Provider notified of desire to go home. NAD

## 2018-09-17 NOTE — ED Provider Notes (Signed)
[facility] DEPT Provider Note   CSN: [id number] Arrival date & time: 09/17/18  0153    History   Chief Complaint Chief Complaint  Patient presents with  . Abdominal Pain    HPI Crystal Benton is a 58 y.o. female.     Patient is a 58 year old female with past medical history of ulcerative colitis, anemia.  She presents today for evaluation of abdominal pain.  This started yesterday evening after eating spaghetti.  Pain is located across the upper abdomen and worse when she presses on the area or changes position.  She denies any fevers or chills.  She has been nauseated and having several episodes of vomiting that has been nonbloody.  She does describe an episode of bloody stool earlier in the week, however she states that this is not unusual for her.  Her most recent colonoscopy was in January 2020 according to the patient.  The history is provided by the patient.  Abdominal Pain  Pain location:  Epigastric and periumbilical Pain quality: cramping   Pain radiates to:  Does not radiate Pain severity:  Moderate Onset quality:  Sudden Timing:  Constant Progression:  Worsening Chronicity:  New Relieved by:  Nothing Worsened by:  Movement and palpation Ineffective treatments:  None tried   Past Medical History:  Diagnosis Date  . Ulcerative colitis     Patient Active Problem List   Diagnosis Date Noted  . Dehydration 05/28/2018  . Ulcerative colitis, rectosigmoid, with rectal bleeding ([place]) 05/26/2018  . UNSPECIFIED ANEMIA 01/24/2009  . IBD 01/24/2009    Past Surgical History:  Procedure Laterality Date  . ABDOMINAL SURGERY    . BIOPSY  05/29/2018   Procedure: BIOPSY;  Surgeon: Carol Ada, MD;  Location: [facility];  Service: Endoscopy;;  . COLON SURGERY    . COLONOSCOPY WITH PROPOFOL N/A 05/29/2018   Procedure: COLONOSCOPY WITH PROPOFOL;  Surgeon: Carol Ada, MD;  Location: [facility];  Service: Endoscopy;  Laterality: N/A;  .  POLYPECTOMY  05/29/2018   Procedure: POLYPECTOMY;  Surgeon: Carol Ada, MD;  Location: [facility] ENDOSCOPY;  Service: Endoscopy;;     OB History   No obstetric history on file.      Home Medications    Prior to Admission medications   Medication Sig Start Date End Date Taking? Authorizing Provider  mesalamine (CANASA) 1000 MG suppository Place 1 suppository (1,000 mg total) rectally at bedtime. 05/30/18   Rehman, Areeg N, DO  mesalamine (CANASA) 1000 MG suppository Place 1 suppository (1,000 mg total) rectally at bedtime. 05/30/18   Rehman, Areeg N, DO  omeprazole (PRILOSEC) 20 MG capsule Take 20 mg by mouth daily as needed (acid reflux).    [provider]    Family History Family History  Problem Relation Age of Onset  . Hypertension Mother   . Stroke Mother   . Multiple sclerosis Sister     Social History Social History   Tobacco Use  . Smoking status: Never Smoker  . Smokeless tobacco: Never Used  Substance Use Topics  . Alcohol use: Yes    Comment: occ  . Drug use: No     Allergies   Onion; Flagyl [metronidazole]; and Metoclopramide hcl   Review of Systems Review of Systems  Gastrointestinal: Positive for abdominal pain.  All other systems reviewed and are negative.    Physical Exam Updated Vital Signs BP (!) 149/88   Pulse 89   Temp 98.5 F (36.9 C) (Oral)   Resp 20  LMP 05/12/2013   SpO2 99%   Physical Exam Vitals signs and nursing note reviewed.  Constitutional:      General: She is not in acute distress.    Appearance: She is well-developed. She is not diaphoretic.  HENT:     Head: Normocephalic and atraumatic.  Neck:     Musculoskeletal: Normal range of motion and neck supple.  Cardiovascular:     Rate and Rhythm: Normal rate and regular rhythm.     Heart sounds: No murmur. No friction rub. No gallop.   Pulmonary:     Effort: Pulmonary effort is normal. No respiratory distress.     Breath sounds: Normal breath sounds. No  wheezing.  Abdominal:     General: Bowel sounds are normal. There is no distension.     Palpations: Abdomen is soft.     Tenderness: There is abdominal tenderness in the epigastric area and periumbilical area. There is no right CVA tenderness, left CVA tenderness, guarding or rebound.  Musculoskeletal: Normal range of motion.  Skin:    General: Skin is warm and dry.  Neurological:     Mental Status: She is alert and oriented to person, place, and time.      ED Treatments / Results  Labs (all labs ordered are listed, but only abnormal results are displayed) Labs Reviewed  COMPREHENSIVE METABOLIC PANEL  LIPASE, BLOOD  CBC WITH DIFFERENTIAL/PLATELET  URINALYSIS, ROUTINE W REFLEX MICROSCOPIC    EKG None  Radiology No results found.  Procedures Procedures (including critical care time)  Medications Ordered in ED Medications  sodium chloride 0.9 % bolus 1,000 mL (has no administration in time range)  ondansetron (ZOFRAN) injection 4 mg (has no administration in time range)  morphine 4 MG/ML injection 4 mg (has no administration in time range)     Initial Impression / Assessment and Plan / ED Course  I have reviewed the triage vital signs and the nursing notes.  Pertinent labs & imaging results that were available during my care of the patient were reviewed by me and considered in my medical decision making (see chart for details).  Patient presents here with complaints of abdominal pain.  She has a history of ulcerative colitis and based on CT scan this evening, this appears to be a flareup of this.  Patient's blood counts are within normal limits.  She will be discharged with steroids, pain medication, and follow-up with her GI doctor.  Final Clinical Impressions(s) / ED Diagnoses   Final diagnoses:  None    ED Discharge Orders    None       Veryl Speak, MD 09/17/18 [phone]

## 2018-09-17 NOTE — ED Triage Notes (Signed)
Pt complains of acute abdominal pain that started about 10pm tonight, she also says that she's been vomiting Pt states that she noticed blood in her stool on Sunday but has ulcerative colitis and that's a symptom, tonight she said the blood was dark red and filled the toilet

## 2019-01-05 DIAGNOSIS — K219 Gastro-esophageal reflux disease without esophagitis: Secondary | ICD-10-CM

## 2019-01-05 DIAGNOSIS — E569 Vitamin deficiency, unspecified: Secondary | ICD-10-CM

## 2019-01-05 DIAGNOSIS — E782 Mixed hyperlipidemia: Secondary | ICD-10-CM

## 2019-01-05 HISTORY — DX: Vitamin deficiency, unspecified: E56.9

## 2019-01-05 HISTORY — DX: Mixed hyperlipidemia: E78.2

## 2019-01-05 HISTORY — DX: Gastro-esophageal reflux disease without esophagitis: K21.9

## 2019-01-11 ENCOUNTER — Emergency Department (HOSPITAL_COMMUNITY)
Admission: EM | Admit: 2019-01-11 | Discharge: 2019-01-11 | Disposition: A | Discharge status: Home / Self Care | Payer: BLUE CROSS/BLUE SHIELD | Attending: Emergency Medicine | Admitting: Emergency Medicine

## 2019-01-11 ENCOUNTER — Other Ambulatory Visit: Payer: Self-pay

## 2019-01-11 ENCOUNTER — Encounter (HOSPITAL_COMMUNITY): Payer: Self-pay | Admitting: Emergency Medicine

## 2019-01-11 DIAGNOSIS — K51911 Ulcerative colitis, unspecified with rectal bleeding: Secondary | ICD-10-CM

## 2019-01-11 DIAGNOSIS — R1013 Epigastric pain: Secondary | ICD-10-CM | POA: Diagnosis present

## 2019-01-11 LAB — COMPREHENSIVE METABOLIC PANEL
ALT: 21 U/L (ref 0–44)
AST: 18 U/L (ref 15–41)
Albumin: 4.4 g/dL (ref 3.5–5.0)
Alkaline Phosphatase: 95 U/L (ref 38–126)
Anion gap: 9 (ref 5–15)
BUN: 16 mg/dL (ref 6–20)
CO2: 25 mmol/L (ref 22–32)
Calcium: 9.7 mg/dL (ref 8.9–10.3)
Chloride: 108 mmol/L (ref 98–111)
Creatinine, Ser: 0.75 mg/dL (ref 0.44–1.00)
GFR calc Af Amer: >60 mL/min (ref >60)
GFR calc non Af Amer: >60 mL/min (ref >60)
Glucose, Bld: 93 mg/dL (ref 70–99)
Potassium: 3.9 mmol/L (ref 3.5–5.1)
Sodium: 142 mmol/L (ref 135–145)
Total Bilirubin: 0.3 mg/dL (ref 0.3–1.2)
Total Protein: 7.5 g/dL (ref 6.5–8.1)

## 2019-01-11 LAB — CBC WITH DIFFERENTIAL/PLATELET
Abs Immature Granulocytes: 0.02 10*3/uL (ref 0.00–0.07)
Basophils Absolute: 0 10*3/uL (ref 0.0–0.1)
Basophils Relative: 1 %
Eosinophils Absolute: 0.2 10*3/uL (ref 0.0–0.5)
Eosinophils Relative: 3 %
HCT: 39.3 % (ref 36.0–46.0)
Hemoglobin: 12.8 g/dL (ref 12.0–15.0)
Immature Granulocytes: 0 %
Lymphocytes Relative: 35 %
Lymphs Abs: 1.7 10*3/uL (ref 0.7–4.0)
MCH: 31.9 pg (ref 26.0–34.0)
MCHC: 32.6 g/dL (ref 30.0–36.0)
MCV: 98 fL (ref 80.0–100.0)
Monocytes Absolute: 0.4 10*3/uL (ref 0.1–1.0)
Monocytes Relative: 8 %
Neutro Abs: 2.6 10*3/uL (ref 1.7–7.7)
Neutrophils Relative %: 53 %
Platelets: 263 10*3/uL (ref 150–400)
RBC: 4.01 MIL/uL (ref 3.87–5.11)
RDW: 12.5 % (ref 11.5–15.5)
WBC: 4.9 10*3/uL (ref 4.0–10.5)
nRBC: 0 % (ref 0.0–0.2)

## 2019-01-11 LAB — LIPASE, BLOOD: Lipase: 34 U/L (ref 11–51)

## 2019-01-11 MED ORDER — ALUM & MAG HYDROXIDE-SIMETH 200-200-20 MG/5ML PO SUSP
30.0000 mL | Freq: Once | ORAL | Status: AC
  Administered 2019-01-11: 30 mL via ORAL
  Filled 2019-01-11: qty 30

## 2019-01-11 MED ORDER — DIPHENHYDRAMINE HCL 50 MG/ML IJ SOLN
25.0000 mg | Freq: Once | INTRAMUSCULAR | Status: AC
  Administered 2019-01-11: 25 mg via INTRAVENOUS
  Filled 2019-01-11: qty 1

## 2019-01-11 MED ORDER — PREDNISONE 20 MG PO TABS
60.0000 mg | ORAL_TABLET | Freq: Once | ORAL | Status: AC
  Administered 2019-01-11: 60 mg via ORAL
  Filled 2019-01-11: qty 3

## 2019-01-11 MED ORDER — PREDNISONE 20 MG PO TABS: ORAL_TABLET | ORAL | 0 refills | Status: DC

## 2019-01-11 MED ORDER — HYDROMORPHONE HCL 1 MG/ML IJ SOLN
0.5000 mg | Freq: Once | INTRAMUSCULAR | Status: AC
  Administered 2019-01-11: 17:00:00 0.5 mg via INTRAVENOUS
  Filled 2019-01-11: qty 1

## 2019-01-11 MED ORDER — PROCHLORPERAZINE EDISYLATE 10 MG/2ML IJ SOLN
10.0000 mg | Freq: Once | INTRAMUSCULAR | Status: AC
  Administered 2019-01-11: 10 mg via INTRAVENOUS
  Filled 2019-01-11: qty 2

## 2019-01-11 MED ORDER — SODIUM CHLORIDE 0.9 % IV BOLUS
1000.0000 mL | Freq: Once | INTRAVENOUS | Status: AC
  Administered 2019-01-11: 1000 mL via INTRAVENOUS

## 2019-01-11 NOTE — ED Triage Notes (Signed)
Patient c/o generalized abdominal pain since last night. Reports one episode of vomiting last night and one bloody stool this morning. Hx ulcerative colitis.

## 2019-01-11 NOTE — Discharge Instructions (Signed)
Follow up with your PCP and GI.  Return for worsening pain, fever, inability to eat or drink.

## 2019-01-11 NOTE — Progress Notes (Addendum)
TOC CM will arrange appt with [facility] [place] and call pt with appt time. Spoke to pt and update her with information. Requested appt time after 3 pm because she works. Agreeable to being set up at [facility]. Jonnie Finner RN CCM, [facility] ED TOC CM [phone]  5:26 pm Contacted pt with info on appt at [place] on 01/20/2019 at 1:00 pm. Left HIPAA complaint voice message. [place], [place] ED TOC CM [phone]

## 2019-01-11 NOTE — ED Provider Notes (Signed)
[facility] DEPT Provider Note   CSN: [id number] Arrival date & time: 01/11/19  1439     History   Chief Complaint Chief Complaint  Patient presents with  . Abdominal Pain    HPI Crystal Benton is a 58 y.o. female.     58 yo F with a chief complaints of epigastric abdominal pain.  Started last night.  Associated with vomiting and diarrhea.  Patient has a history of ulcerative colitis and thinks this feels like a flare.  She had 1 bowel movement this morning that was formed but followed by a large amount of blood.  Has had a couple episodes of vomiting.  Has been able to tolerate some p.o.  Some chills but no fevers.  The history is provided by the patient.  Abdominal Pain Pain location:  Epigastric Pain quality: burning   Pain radiates to:  Does not radiate Pain severity:  Moderate Onset quality:  Gradual Duration:  1 day Timing:  Constant Progression:  Worsening Chronicity:  New Relieved by:  Nothing Worsened by:  Nothing Associated symptoms: chills, diarrhea, nausea and vomiting   Associated symptoms: no chest pain, no dysuria, no fever and no shortness of breath     Past Medical History:  Diagnosis Date  . Ulcerative colitis     Patient Active Problem List   Diagnosis Date Noted  . Dehydration 05/28/2018  . Ulcerative colitis, rectosigmoid, with rectal bleeding ([place]) 05/26/2018  . UNSPECIFIED ANEMIA 01/24/2009  . IBD 01/24/2009    Past Surgical History:  Procedure Laterality Date  . ABDOMINAL SURGERY    . BIOPSY  05/29/2018   Procedure: BIOPSY;  Surgeon: Carol Ada, MD;  Location: [facility];  Service: Endoscopy;;  . COLON SURGERY    . COLONOSCOPY WITH PROPOFOL N/A 05/29/2018   Procedure: COLONOSCOPY WITH PROPOFOL;  Surgeon: Carol Ada, MD;  Location: [facility];  Service: Endoscopy;  Laterality: N/A;  . POLYPECTOMY  05/29/2018   Procedure: POLYPECTOMY;  Surgeon: Carol Ada, MD;  Location: [facility] ENDOSCOPY;  Service:  Endoscopy;;     OB History   No obstetric history on file.      Home Medications    Prior to Admission medications   Medication Sig Start Date End Date Taking? Authorizing Provider  HYDROcodone-acetaminophen (NORCO) 5-325 MG tablet Take 1-2 tablets by mouth every 6 (six) hours as needed. Patient not taking: Reported on 01/11/2019 09/17/18   Veryl Speak, MD  mesalamine (CANASA) 1000 MG suppository Place 1 suppository (1,000 mg total) rectally at bedtime. Patient not taking: Reported on 01/11/2019 05/30/18   Rehman, Areeg N, DO  mesalamine (CANASA) 1000 MG suppository Place 1 suppository (1,000 mg total) rectally at bedtime. Patient not taking: Reported on 01/11/2019 05/30/18   Rehman, Areeg N, DO  predniSONE (DELTASONE) 20 MG tablet 2 tabs po daily x 4 days 01/11/19   Deno Etienne, DO    Family History Family History  Problem Relation Age of Onset  . Hypertension Mother   . Stroke Mother   . Multiple sclerosis Sister     Social History Social History   Tobacco Use  . Smoking status: Never Smoker  . Smokeless tobacco: Never Used  Substance Use Topics  . Alcohol use: Yes    Comment: occ  . Drug use: No     Allergies   Onion, Flagyl [metronidazole], and Metoclopramide hcl   Review of Systems Review of Systems  Constitutional: Positive for chills. Negative for fever.  HENT: Negative for congestion and rhinorrhea.  Eyes: Negative for redness and visual disturbance.  Respiratory: Negative for shortness of breath and wheezing.   Cardiovascular: Negative for chest pain and palpitations.  Gastrointestinal: Positive for abdominal pain, blood in stool, diarrhea, nausea and vomiting.  Genitourinary: Negative for dysuria and urgency.  Musculoskeletal: Negative for arthralgias and myalgias.  Skin: Negative for pallor and wound.  Neurological: Negative for dizziness and headaches.     Physical Exam Updated Vital Signs BP (!) 144/103 (BP Location: Right Wrist)   Pulse 67   Temp  98.2 F (36.8 C) (Oral)   Resp 18   Ht 5\' 7"  (1.702 m)   Wt 98.4 kg   LMP 05/12/2013   SpO2 100%   BMI 33.99 kg/m   Physical Exam Vitals signs and nursing note reviewed.  Constitutional:      General: She is not in acute distress.    Appearance: She is well-developed. She is not diaphoretic.  HENT:     Head: Normocephalic and atraumatic.  Eyes:     Pupils: Pupils are equal, round, and reactive to light.  Neck:     Musculoskeletal: Normal range of motion and neck supple.  Cardiovascular:     Rate and Rhythm: Normal rate and regular rhythm.     Heart sounds: No murmur. No friction rub. No gallop.   Pulmonary:     Effort: Pulmonary effort is normal.     Breath sounds: No wheezing or rales.  Abdominal:     General: There is no distension.     Palpations: Abdomen is soft.     Tenderness: There is abdominal tenderness (mild, negative murphys) in the epigastric area.  Musculoskeletal:        General: No tenderness.  Skin:    General: Skin is warm and dry.  Neurological:     Mental Status: She is alert and oriented to person, place, and time.  Psychiatric:        Behavior: Behavior normal.      ED Treatments / Results  Labs (all labs ordered are listed, but only abnormal results are displayed) Labs Reviewed  COMPREHENSIVE METABOLIC PANEL  LIPASE, BLOOD  CBC WITH DIFFERENTIAL/PLATELET  CBC WITH DIFFERENTIAL/PLATELET    EKG None  Radiology No results found.  Procedures Procedures (including critical care time)  Medications Ordered in ED Medications  sodium chloride 0.9 % bolus 1,000 mL (0 mLs Intravenous Stopped 01/11/19 2007)  HYDROmorphone (DILAUDID) injection 0.5 mg (0.5 mg Intravenous Given 01/11/19 1643)  alum & mag hydroxide-simeth (MAALOX/MYLANTA) 200-200-20 MG/5ML suspension 30 mL (30 mLs Oral Given 01/11/19 1644)  prochlorperazine (COMPAZINE) injection 10 mg (10 mg Intravenous Given 01/11/19 1640)  diphenhydrAMINE (BENADRYL) injection 25 mg (25 mg  Intravenous Given 01/11/19 1642)  predniSONE (DELTASONE) tablet 60 mg (60 mg Oral Given 01/11/19 2012)     Initial Impression / Assessment and Plan / ED Course  I have reviewed the triage vital signs and the nursing notes.  Pertinent labs & imaging results that were available during my care of the patient were reviewed by me and considered in my medical decision making (see chart for details).        58 yo F with a chief complaint of abdominal pain.  Feels like her prior ulcerative colitis flare.  We will treat her pain and nausea.  Lab work.  Reassess.  Patient is reassessed and feeling much better.  Tolerating p.o.  Lab work without leukocytosis or concerning LFT elevation.  Will discharge the patient home.  GI follow-up.  Case management was consulted and arrange for the patient to establish a PCP.  8:14 PM:  I have discussed the diagnosis/risks/treatment options with the patient and believe the pt to be eligible for discharge home to follow-up with PCP, GI. We also discussed returning to the ED immediately if new or worsening sx occur. We discussed the sx which are most concerning (e.g., sudden worsening pain, fever, inability to tolerate by mouth) that necessitate immediate return. Medications administered to the patient during their visit and any new prescriptions provided to the patient are listed below.  Medications given during this visit Medications  sodium chloride 0.9 % bolus 1,000 mL (0 mLs Intravenous Stopped 01/11/19 2007)  HYDROmorphone (DILAUDID) injection 0.5 mg (0.5 mg Intravenous Given 01/11/19 1643)  alum & mag hydroxide-simeth (MAALOX/MYLANTA) 200-200-20 MG/5ML suspension 30 mL (30 mLs Oral Given 01/11/19 1644)  prochlorperazine (COMPAZINE) injection 10 mg (10 mg Intravenous Given 01/11/19 1640)  diphenhydrAMINE (BENADRYL) injection 25 mg (25 mg Intravenous Given 01/11/19 1642)  predniSONE (DELTASONE) tablet 60 mg (60 mg Oral Given 01/11/19 2012)     The patient appears  reasonably screen and/or stabilized for discharge and I doubt any other medical condition or other [facility] requiring further screening, evaluation, or treatment in the ED at this time prior to discharge.    Final Clinical Impressions(s) / ED Diagnoses   Final diagnoses:  Ulcerative colitis with rectal bleeding, unspecified location [facility])    ED Discharge Orders         Ordered    predniSONE (DELTASONE) 20 MG tablet     01/11/19 Orr, Risha Barretta, DO 01/11/19 2014

## 2019-01-20 ENCOUNTER — Ambulatory Visit (INDEPENDENT_AMBULATORY_CARE_PROVIDER_SITE_OTHER): Payer: BLUE CROSS/BLUE SHIELD | Admitting: Family Medicine

## 2019-01-20 ENCOUNTER — Encounter: Payer: Self-pay | Admitting: Family Medicine

## 2019-01-20 ENCOUNTER — Other Ambulatory Visit: Payer: Self-pay

## 2019-01-20 VITALS — BP 110/76 | HR 76 | Ht 67.0 in | Wt 230.2 lb

## 2019-01-20 DIAGNOSIS — Z0000 Encounter for general adult medical examination without abnormal findings: Secondary | ICD-10-CM | POA: Diagnosis not present

## 2019-01-20 DIAGNOSIS — K51919 Ulcerative colitis, unspecified with unspecified complications: Secondary | ICD-10-CM | POA: Diagnosis not present

## 2019-01-20 DIAGNOSIS — R600 Localized edema: Secondary | ICD-10-CM

## 2019-01-20 DIAGNOSIS — K625 Hemorrhage of anus and rectum: Secondary | ICD-10-CM

## 2019-01-20 DIAGNOSIS — Z1239 Encounter for other screening for malignant neoplasm of breast: Secondary | ICD-10-CM

## 2019-01-20 DIAGNOSIS — R197 Diarrhea, unspecified: Secondary | ICD-10-CM

## 2019-01-20 DIAGNOSIS — Z7689 Persons encountering health services in other specified circumstances: Secondary | ICD-10-CM

## 2019-01-20 DIAGNOSIS — Z09 Encounter for follow-up examination after completed treatment for conditions other than malignant neoplasm: Secondary | ICD-10-CM

## 2019-01-20 LAB — POCT URINALYSIS DIPSTICK
Bilirubin, UA: NEGATIVE
Blood, UA: NEGATIVE
Glucose, UA: NEGATIVE
Ketones, UA: NEGATIVE
Nitrite, UA: NEGATIVE
Protein, UA: NEGATIVE
Spec Grav, UA: >=1.03 — AB (ref 1.010–1.025)
Urobilinogen, UA: 0.2 E.U./dL
pH, UA: 6.5 (ref 5.0–8.0)

## 2019-01-20 MED ORDER — FUROSEMIDE 20 MG PO TABS: 20.0000 mg | ORAL_TABLET | Freq: Every day | ORAL | 3 refills | Status: DC

## 2019-01-20 MED ORDER — LOPERAMIDE HCL 2 MG PO TABS: 2.0000 mg | ORAL_TABLET | Freq: Four times a day (QID) | ORAL | 4 refills | Status: DC | PRN

## 2019-01-20 NOTE — Progress Notes (Signed)
[name]  New Patient--Hospital Follow Up--Establish Care  Subjective:  Patient ID: Crystal Benton, female    DOB: 1960/12/20  Age: 58 y.o. MRN: [id number]  CC:  Chief Complaint  Patient presents with  . New Patient (Initial Visit)    establishing, swelling in feet, feeling trid, heartburn     HPI Crystal Benton is a 58 year old female who presents to Establish Care   Past Medical History:  Diagnosis Date  . Acid reflux 01/2019  . Diarrhea   . Ulcerative colitis    Current Status: This will be her initial office visit with me. She was previously not seeing a physician for her PC needs. Since her last office visit, she has been having increased fatigue recently. She has c/o acid reflux frequently. She has increased episodes of diarrhea lately. She reports 3-4 episodes a day, which happens about 1 hour after she eats. She has a history of Ulcerative Colitis, in which her symptoms in. She was a previous patient of Dr. Paulita Fujita @ [facility]. She denies nausea, vomiting, and constipation.  She denies fevers, chills, fatigue, recent infections, weight loss, and night sweats. She has not had any headaches, visual changes, dizziness, and falls. No chest pain, heart palpitations, cough and shortness of breath reported. She has no reports of blood in stools, dysuria and hematuria. No depression or anxiety, and denies suicidal ideations, homicidal ideations, or auditory hallucinations. She denies pain today.   Past Surgical History:  Procedure Laterality Date  . ABDOMINAL SURGERY    . BIOPSY  05/29/2018   Procedure: BIOPSY;  Surgeon: Carol Ada, MD;  Location: [place];  Service: Endoscopy;;  . COLON SURGERY    . COLONOSCOPY WITH PROPOFOL N/A 05/29/2018   Procedure: COLONOSCOPY WITH PROPOFOL;  Surgeon: Carol Ada, MD;  Location: [place];  Service: Endoscopy;  Laterality: N/A;  . POLYPECTOMY  05/29/2018   Procedure: POLYPECTOMY;  Surgeon:  Carol Ada, MD;  Location: [facility] ENDOSCOPY;  Service: Endoscopy;;    Family History  Problem Relation Age of Onset  . Hypertension Mother   . Stroke Mother   . Seizures Mother   . Multiple sclerosis Sister     Social History   Socioeconomic History  . Marital status: Divorced    Spouse name: Not on file  . Number of children: Not on file  . Years of education: Not on file  . Highest education level: Not on file  Occupational History  . Not on file  Social Needs  . Financial resource strain: Not on file  . Food insecurity    Worry: Not on file    Inability: Not on file  . Transportation needs    Medical: Not on file    Non-medical: Not on file  Tobacco Use  . Smoking status: Never Smoker  . Smokeless tobacco: Never Used  Substance and Sexual Activity  . Alcohol use: Yes    Comment: occ  . Drug use: No  . Sexual activity: Yes  Lifestyle  . Physical activity    Days per week: Not on file    Minutes per session: Not on file  . Stress: Not on file  Relationships  . Social Herbalist on phone: Not on file    Gets together: Not on file    Attends religious service: Not on file    Active member of club or organization: Not on file    Attends meetings of clubs  or organizations: Not on file    Relationship status: Not on file  . Intimate partner violence    Fear of current or ex partner: Not on file    Emotionally abused: Not on file    Physically abused: Not on file    Forced sexual activity: Not on file  Other Topics Concern  . Not on file  Social History Narrative  . Not on file    Outpatient Medications Prior to Visit  Medication Sig Dispense Refill  . omeprazole (PRILOSEC) 20 MG capsule Take 20 mg by mouth daily.    Marland Kitchen HYDROcodone-acetaminophen (NORCO) 5-325 MG tablet Take 1-2 tablets by mouth every 6 (six) hours as needed. (Patient not taking: Reported on 01/11/2019) 15 tablet 0  . mesalamine (CANASA) 1000 MG suppository Place 1 suppository (1,000  mg total) rectally at bedtime. (Patient not taking: Reported on 01/11/2019) 30 suppository 12  . mesalamine (CANASA) 1000 MG suppository Place 1 suppository (1,000 mg total) rectally at bedtime. (Patient not taking: Reported on 01/11/2019) 30 suppository 12  . predniSONE (DELTASONE) 20 MG tablet 2 tabs po daily x 4 days (Patient not taking: Reported on 01/20/2019) 8 tablet 0   No facility-administered medications prior to visit.     Allergies  Allergen Reactions  . Onion Anaphylaxis  . Flagyl [Metronidazole] Other (See Comments)    Makes her feel like she's dying  . Metoclopramide Hcl Other (See Comments)    cramping    ROS Review of Systems  Constitutional: Positive for fatigue (increased).  HENT: Negative.   Eyes: Negative.   Respiratory: Negative.   Cardiovascular: Negative.   Gastrointestinal: Positive for diarrhea (increased frequency r/t history of ulcerative colitis).       Acid reflux  Endocrine: Negative.   Genitourinary: Negative.   Musculoskeletal: Negative.   Skin: Negative.   Allergic/Immunologic: Negative.   Neurological: Negative.   Hematological: Negative.   Psychiatric/Behavioral: Negative.    Objective:    Physical Exam  Constitutional: She is oriented to person, place, and time. She appears well-developed and well-nourished.  HENT:  Head: Normocephalic and atraumatic.  Eyes: Conjunctivae are normal.  Neck: Normal range of motion. Neck supple.  Cardiovascular: Normal rate, regular rhythm, normal heart sounds and intact distal pulses.  Pulmonary/Chest: Effort normal and breath sounds normal.  Abdominal: Soft. Bowel sounds are normal. She exhibits distension (obese).  Musculoskeletal: Normal range of motion.  Neurological: She is alert and oriented to person, place, and time. She has normal reflexes.  Skin: Skin is warm and dry.  Psychiatric: She has a normal mood and affect. Her behavior is normal. Judgment and thought content normal.  Nursing note and  vitals reviewed.   BP 110/76 (BP Location: Left Arm, Patient Position: Sitting, Cuff Size: Normal)   Pulse 76   Ht 5\' 7"  (1.702 m)   Wt 230 lb 3.2 oz (104.4 kg)   LMP 05/12/2013   SpO2 100%   BMI 36.05 kg/m  Wt Readings from Last 3 Encounters:  01/20/19 230 lb 3.2 oz (104.4 kg)  01/11/19 217 lb (98.4 kg)  05/27/18 219 lb 5.7 oz (99.5 kg)     Health Maintenance Due  Topic Date Due  . Hepatitis C Screening  01/05/62  . TETANUS/TDAP  01/04/1981  . PAP SMEAR-Modifier  01/05/1983  . MAMMOGRAM  01/05/2012    There are no preventive care reminders to display for this patient.  Lab Results  Component Value Date   TSH 1.690 01/20/2019   Lab Results  Component Value Date   WBC 4.9 01/11/2019   HGB 12.8 01/11/2019   HCT 39.3 01/11/2019   MCV 98.0 01/11/2019   PLT 263 01/11/2019   Lab Results  Component Value Date   NA 142 01/11/2019   K 3.9 01/11/2019   CO2 25 01/11/2019   GLUCOSE 93 01/11/2019   BUN 16 01/11/2019   CREATININE 0.75 01/11/2019   BILITOT 0.3 01/11/2019   ALKPHOS 95 01/11/2019   AST 18 01/11/2019   ALT 21 01/11/2019   PROT 7.5 01/11/2019   ALBUMIN 4.4 01/11/2019   CALCIUM 9.7 01/11/2019   ANIONGAP 9 01/11/2019   Lab Results  Component Value Date   CHOL 219 (H) 01/20/2019   Lab Results  Component Value Date   HDL 58 01/20/2019   No results found for: [facility] Lab Results  Component Value Date   TRIG 216 (H) 01/20/2019   Lab Results  Component Value Date   CHOLHDL 3.8 01/20/2019   Lab Results  Component Value Date   HGBA1C 5.6 01/20/2019    Assessment & Plan:   1. Encounter to establish care  2. Ulcerative colitis with complication, unspecified location [facility]) - Ambulatory referral to Gastroenterology - loperamide (IMODIUM A-D) 2 MG tablet; Take 1 tablet (2 mg total) by mouth 4 (four) times daily as needed for diarrhea or loose stools. (Up to 8 tablets a day).  Dispense: 30 tablet; Refill: 4  3. Rectal bleeding Scant. R/t  ulcerative colitis.  - Ambulatory referral to Gastroenterology - loperamide (IMODIUM A-D) 2 MG tablet; Take 1 tablet (2 mg total) by mouth 4 (four) times daily as needed for diarrhea or loose stools. (Up to 8 tablets a day).  Dispense: 30 tablet; Refill: 4  4. Diarrhea, unspecified type We will initiate Imodium today.   5. Bilateral lower extremity edema We will initiate diuretic today.  - furosemide (LASIX) 20 MG tablet; Take 1 tablet (20 mg total) by mouth daily.  Dispense: 30 tablet; Refill: 3  6. Screening for breast cancer - MM 3D SCREEN BREAST BILATERAL; Future  7. Healthcare maintenance - POCT Urinalysis Dipstick - TSH - Lipid Panel - Hemoglobin A1c - Vitamin B12 - Vitamin D, 25-hydroxy  8. Follow up She will follow up in 1 month.   Meds ordered this encounter  Medications  . loperamide (IMODIUM A-D) 2 MG tablet    Sig: Take 1 tablet (2 mg total) by mouth 4 (four) times daily as needed for diarrhea or loose stools. (Up to 8 tablets a day).    Dispense:  30 tablet    Refill:  4  . furosemide (LASIX) 20 MG tablet    Sig: Take 1 tablet (20 mg total) by mouth daily.    Dispense:  30 tablet    Refill:  3    Orders Placed This Encounter  Procedures  . MM 3D SCREEN BREAST BILATERAL  . TSH  . Lipid Panel  . Hemoglobin A1c  . Vitamin B12  . Vitamin D, 25-hydroxy  . Ambulatory referral to Gastroenterology  . POCT Urinalysis Dipstick     Referral Orders     Ambulatory referral to Gastroenterology   Kathe Becton,  MSN, FNP-BC [facility] [address] [place], [place] [address] [phone] fax   Problem List Items Addressed This Visit    None    Visit Diagnoses    Healthcare maintenance    -  Primary   Relevant Orders  POCT Urinalysis Dipstick (Completed)   TSH (Completed)   Lipid Panel (Completed)   Hemoglobin A1c (Completed)   Vitamin B12 (Completed)   Vitamin D,  25-hydroxy (Completed)   Encounter to establish care       Ulcerative colitis with complication, unspecified location [facility])       Relevant Medications   loperamide (IMODIUM A-D) 2 MG tablet   Other Relevant Orders   Ambulatory referral to Gastroenterology   Rectal bleeding       Relevant Medications   loperamide (IMODIUM A-D) 2 MG tablet   Other Relevant Orders   Ambulatory referral to Gastroenterology   Diarrhea, unspecified type       Bilateral lower extremity edema       Relevant Medications   furosemide (LASIX) 20 MG tablet   Screening for breast cancer       Relevant Orders   MM 3D SCREEN BREAST BILATERAL   Follow up          Meds ordered this encounter  Medications  . loperamide (IMODIUM A-D) 2 MG tablet    Sig: Take 1 tablet (2 mg total) by mouth 4 (four) times daily as needed for diarrhea or loose stools. (Up to 8 tablets a day).    Dispense:  30 tablet    Refill:  4  . furosemide (LASIX) 20 MG tablet    Sig: Take 1 tablet (20 mg total) by mouth daily.    Dispense:  30 tablet    Refill:  3    Follow-up: Return in about 1 month (around 02/19/2019).    Azzie Glatter, FNP

## 2019-01-20 NOTE — Patient Instructions (Addendum)
Diarrhea, Adult Diarrhea is when you pass loose and watery poop (stool) often. Diarrhea can make you feel weak and cause you to lose water in your body (get dehydrated). Losing water in your body can cause you to:  Feel tired and thirsty.  Have a dry mouth.  Go pee (urinate) less often. Diarrhea often lasts 2-3 days. However, it can last longer if it is a sign of something more serious. It is important to treat your diarrhea as told by your doctor. Follow these instructions at home: Eating and drinking     Follow these instructions as told by your doctor:  Take an ORS (oral rehydration solution). This is a drink that helps you replace fluids and minerals your body lost. It is sold at pharmacies and stores.  Drink plenty of fluids, such as: ? Water. ? Ice chips. ? Diluted fruit juice. ? Low-calorie sports drinks. ? Milk, if you want.  Avoid drinking fluids that have a lot of sugar or caffeine in them.  Eat bland, easy-to-digest foods in small amounts as you are able. These foods include: ? Bananas. ? Applesauce. ? Rice. ? Low-fat (lean) meats. ? Toast. ? Crackers.  Avoid alcohol.  Avoid spicy or fatty foods.  Medicines  Take over-the-counter and prescription medicines only as told by your doctor.  If you were prescribed an antibiotic medicine, take it as told by your doctor. Do not stop using the antibiotic even if you start to feel better. General instructions   Wash your hands often using soap and water. If soap and water are not available, use a hand sanitizer. Others in your home should wash their hands as well. Hands should be washed: ? After using the toilet or changing a diaper. ? Before preparing, cooking, or serving food. ? While caring for a sick person. ? While visiting someone in a hospital.  Drink enough fluid to keep your pee (urine) pale yellow.  Rest at home while you get better.  Watch your condition for any changes.  Take a warm bath to help  with any burning or pain from having diarrhea.  Keep all follow-up visits as told by your doctor. This is important. Contact a doctor if:  You have a fever.  Your diarrhea gets worse.  You have new symptoms.  You cannot keep fluids down.  You feel light-headed or dizzy.  You have a headache.  You have muscle cramps. Get help right away if:  You have chest pain.  You feel very weak or you pass out (faint).  You have bloody or black poop or poop that looks like tar.  You have very bad pain, cramping, or bloating in your belly (abdomen).  You have trouble breathing or you are breathing very quickly.  Your heart is beating very quickly.  Your skin feels cold and clammy.  You feel confused.  You have signs of losing too much water in your body, such as: ? Dark pee, very little pee, or no pee. ? Cracked lips. ? Dry mouth. ? Sunken eyes. ? Sleepiness. ? Weakness. Summary  Diarrhea is when you pass loose and watery poop (stool) often.  Diarrhea can make you feel weak and cause you to lose water in your body (get dehydrated).  Take an ORS (oral rehydration solution). This is a drink that is sold at pharmacies and stores.  Eat bland, easy-to-digest foods in small amounts as you are able.  Contact a doctor if your condition gets worse. Get help right  away if you have signs that you have lost too much water in your body. This information is not intended to replace advice given to you by your health care provider. Make sure you discuss any questions you have with your health care provider. Document Released: 10/09/2007 Document Revised: 09/26/2017 Document Reviewed: 09/26/2017 Elsevier Patient Education  [employer]. Loperamide; Simethicone oral tablets What is this medicine? LOPERAMIDE; SIMETHICONE (loe PER a mide; sye METH i kone) is a combination product used to treat diarrhea and to decrease the discomfort caused by gas. This medicine may be used for other  purposes; ask your health care provider or pharmacist if you have questions. COMMON BRAND NAME(S): Imodium Advanced, Imodium Multi-Symptom Relief What should I tell my health care provider before I take this medicine? They need to know if you have any of these conditions:  a black or bloody stool  bacterial food poisoning  colitis or mucus in your stool  currently taking an antibiotic medication for an infection  fever  history of irregular heartbeat  liver disease  severe abdominal pain, swelling or bulging  an unusual or allergic reaction to loperamide, simethicone, other medicines, foods, dyes, or preservatives  pregnant or trying to get pregnant  breast-feeding How should I use this medicine? Take this medicine by mouth with a glass of water. Follow the directions on the label. Do not take it more often than directed. Talk to your pediatrician regarding the use of this medicine in children. While this drug may be prescribed for children as young as 60 years of age for selected conditions, precautions do apply. Overdosage: If you think you have taken too much of this medicine contact a poison control center or emergency room at once. NOTE: This medicine is only for you. Do not share this medicine with others. What if I miss a dose? This does not apply. This medicine is not for regular use. Only take this medicine while you continue to have loose bowel movements. Do not take more medicine than recommended by the packaging label or by your healthcare professional. What may interact with this medicine? Do not take this medicine with any of the following medications:  alosetron This medicine may also interact with the following medications:  cimetidine  clarithromycin  erythromycin  gemfibrozil  itraconazole  ketoconazole  quinidinequinine  ranitidine  ritonavir  saquinavir This list may not describe all possible interactions. Give your health care provider a  list of all the medicines, herbs, non-prescription drugs, or dietary supplements you use. Also tell them if you smoke, drink alcohol, or use illegal drugs. Some items may interact with your medicine. What should I watch for while using this medicine? Tell your doctor or healthcare professional if your symptoms do not start to get better within two days or if they get worse. Do not use doses higher than those prescribed by your doctor or listed on the label. Check with your doctor or health care professional right away if you develop a fever, severe abdominal pain, swelling or bulging, or if you have have bloody/black diarrhea or stools. You may get drowsy or dizzy. Do not drive, use machinery, or do anything that needs mental alertness until you know how this medicine affects you. Do not stand or sit up quickly, especially if you are an older patient. This reduces the risk of dizzy or fainting spells. Alcohol may interfere with the effect of this medicine. Your mouth may get dry. Chewing sugarless gum or sucking hard candy,  and drinking plenty of water may help. Contact your doctor if the problem does not go away or is severe. Drinking plenty of water can also help prevent dehydration that can occur with diarrhea. Elderly patients may have a more variable response to the effects of this medicine, and are more susceptible to the effects of dehydration. What side effects may I notice from receiving this medicine? Side effects that you should report to your doctor or health care professional as soon as possible:  allergic reactions like skin rash, itching or hives, swelling of the face, lips, or tongue  bloating  signs and symptoms of a dangerous change in heartbeat or heart rhythm like chest pain; dizziness; fast or irregular heartbeat palpitations; feeling faint or lightheaded, falls; breathing problems  stomach pain Side effects that usually do not require medical attention (report to your doctor or  health care professional if they continue or are bothersome):  constipation  dizziness  dry mouth  nausea, vomiting  tiredness This list may not describe all possible side effects. Call your doctor for medical advice about side effects. You may report side effects to FDA at 1-800-FDA-1088. Where should I keep my medicine? Keep out of the reach of children. Store between 20 and 25 degrees C (68 and 77 degrees F). Throw away any unused medicine after the expiration date. NOTE: This sheet is a summary. It may not cover all possible information. If you have questions about this medicine, talk to your doctor, pharmacist, or health care provider.  2020 Elsevier/Gold Standard (2017-07-14 19:53:30) Edema  Edema is an abnormal buildup of fluids in the body tissues and under the skin. Swelling of the legs, feet, and ankles is a common symptom that becomes more likely as you get older. Swelling is also common in looser tissues, like around the eyes. When the affected area is squeezed, the fluid may move out of that spot and leave a dent for a few moments. This dent is called pitting edema. There are many possible causes of edema. Eating too much salt (sodium) and being on your feet or sitting for a long time can cause edema in your legs, feet, and ankles. Hot weather may make edema worse. Common causes of edema include:  Heart failure.  Liver or kidney disease.  Weak leg blood vessels.  Cancer.  An injury.  Pregnancy.  Medicines.  Being obese.  Low protein levels in the blood. Edema is usually painless. Your skin may look swollen or shiny. Follow these instructions at home:  Keep the affected body part raised (elevated) above the level of your heart when you are sitting or lying down.  Do not sit still or stand for long periods of time.  Do not wear tight clothing. Do not wear garters on your upper legs.  Exercise your legs to get your circulation going. This helps to move the  fluid back into your blood vessels, and it may help the swelling go down.  Wear elastic bandages or support stockings to reduce swelling as told by your health care provider.  Eat a low-salt (low-sodium) diet to reduce fluid as told by your health care provider.  Depending on the cause of your swelling, you may need to limit how much fluid you drink (fluid restriction).  Take over-the-counter and prescription medicines only as told by your health care provider. Contact a health care provider if:  Your edema does not get better with treatment.  You have heart, liver, or kidney disease and have  symptoms of edema.  You have sudden and unexplained weight gain. Get help right away if:  You develop shortness of breath or chest pain.  You cannot breathe when you lie down.  You develop pain, redness, or warmth in the swollen areas.  You have heart, liver, or kidney disease and suddenly get edema.  You have a fever and your symptoms suddenly get worse. Summary  Edema is an abnormal buildup of fluids in the body tissues and under the skin.  Eating too much salt (sodium) and being on your feet or sitting for a long time can cause edema in your legs, feet, and ankles.  Keep the affected body part raised (elevated) above the level of your heart when you are sitting or lying down. This information is not intended to replace advice given to you by your health care provider. Make sure you discuss any questions you have with your health care provider. Document Released: 04/22/2005 Document Revised: 04/25/2017 Document Reviewed: 05/25/2016 Elsevier Patient Education  [place]. Furosemide tablets What is this medicine? FUROSEMIDE (fyoor [place] se mide) is a diuretic. It helps you make more urine and to lose salt and excess water from your body. This medicine is used to treat high blood pressure, and edema or swelling from heart, kidney, or liver disease. This medicine may be used for other  purposes; ask your health care provider or pharmacist if you have questions. COMMON BRAND NAME(S): Active-Medicated Specimen Kit, Delone, Diuscreen, Lasix, RX Specimen Collection Kit, Specimen Collection Kit, URINX Medicated Specimen Collection What should I tell my health care provider before I take this medicine? They need to know if you have any of these conditions:  abnormal blood electrolytes  diarrhea or vomiting  gout  heart disease  kidney disease, small amounts of urine, or difficulty passing urine  liver disease  thyroid disease  an unusual or allergic reaction to furosemide, sulfa drugs, other medicines, foods, dyes, or preservatives  pregnant or trying to get pregnant  breast-feeding How should I use this medicine? Take this medicine by mouth with a glass of water. Follow the directions on the prescription label. You may take this medicine with or without food. If it upsets your stomach, take it with food or milk. Do not take your medicine more often than directed. Remember that you will need to pass more urine after taking this medicine. Do not take your medicine at a time of day that will cause you problems. Do not take at bedtime. Talk to your pediatrician regarding the use of this medicine in children. While this drug may be prescribed for selected conditions, precautions do apply. Overdosage: If you think you have taken too much of this medicine contact a poison control center or emergency room at once. NOTE: This medicine is only for you. Do not share this medicine with others. What if I miss a dose? If you miss a dose, take it as soon as you can. If it is almost time for your next dose, take only that dose. Do not take double or extra doses. What may interact with this medicine?  aspirin and aspirin-like medicines  certain antibiotics  chloral hydrate  cisplatin  cyclosporine  digoxin  diuretics  laxatives  lithium  medicines for blood  pressure  medicines that relax muscles for surgery  methotrexate  NSAIDs, medicines for pain and inflammation like ibuprofen, naproxen, or indomethacin  phenytoin  steroid medicines like prednisone or cortisone  sucralfate  thyroid hormones This list may  not describe all possible interactions. Give your health care provider a list of all the medicines, herbs, non-prescription drugs, or dietary supplements you use. Also tell them if you smoke, drink alcohol, or use illegal drugs. Some items may interact with your medicine. What should I watch for while using this medicine? Visit your doctor or health care provider for regular checks on your progress. Check your blood pressure regularly. Ask your doctor or health care provider what your blood pressure should be, and when you should contact him or her. If you are a diabetic, check your blood sugar as directed. This medicine may cause serious skin reactions. They can happen weeks to months after starting the medicine. Contact your health care provider right away if you notice fevers or flu-like symptoms with a rash. The rash may be red or purple and then turn into blisters or peeling of the skin. Or, you might notice a red rash with swelling of the face, lips or lymph nodes in your neck or under your arms. You may need to be on a special diet while taking this medicine. Check with your doctor. Also, ask how many glasses of fluid you need to drink a day. You must not get dehydrated. You may get drowsy or dizzy. Do not drive, use machinery, or do anything that needs mental alertness until you know how this drug affects you. Do not stand or sit up quickly, especially if you are an older patient. This reduces the risk of dizzy or fainting spells. Alcohol can make you more drowsy and dizzy. Avoid alcoholic drinks. This medicine can make you more sensitive to the sun. Keep out of the sun. If you cannot avoid being in the sun, wear protective clothing and  use sunscreen. Do not use sun lamps or tanning beds/booths. What side effects may I notice from receiving this medicine? Side effects that you should report to your doctor or health care professional as soon as possible:  blood in urine or stools  dry mouth  fever or chills  hearing loss or ringing in the ears  irregular heartbeat  muscle pain or weakness, cramps  rash, fever, and swollen lymph nodes  redness, blistering, peeling or loosening of the skin, including inside the mouth  skin rash  stomach upset, pain, or nausea  tingling or numbness in the hands or feet  unusually weak or tired  vomiting or diarrhea  yellowing of the eyes or skin Side effects that usually do not require medical attention (report to your doctor or health care professional if they continue or are bothersome):  headache  loss of appetite  unusual bleeding or bruising This list may not describe all possible side effects. Call your doctor for medical advice about side effects. You may report side effects to FDA at 1-800-FDA-1088. Where should I keep my medicine? Keep out of the reach of children. Store at room temperature between 15 and 30 degrees C (59 and 86 degrees F). Protect from light. Throw away any unused medicine after the expiration date. NOTE: This sheet is a summary. It may not cover all possible information. If you have questions about this medicine, talk to your doctor, pharmacist, or health care provider.  2020 Elsevier/Gold Standard (2018-07-24 14:04:13)

## 2019-01-21 ENCOUNTER — Encounter: Payer: Self-pay | Admitting: Family Medicine

## 2019-01-21 DIAGNOSIS — K625 Hemorrhage of anus and rectum: Secondary | ICD-10-CM | POA: Insufficient documentation

## 2019-01-21 DIAGNOSIS — R197 Diarrhea, unspecified: Secondary | ICD-10-CM | POA: Insufficient documentation

## 2019-01-21 DIAGNOSIS — K51919 Ulcerative colitis, unspecified with unspecified complications: Secondary | ICD-10-CM | POA: Insufficient documentation

## 2019-01-21 LAB — LIPID PANEL
Chol/HDL Ratio: 3.8 ratio (ref 0.0–4.4)
Cholesterol, Total: 219 mg/dL — ABNORMAL HIGH (ref 100–199)
HDL: 58 mg/dL (ref >39)
LDL Chol Calc (NIH): 123 mg/dL — ABNORMAL HIGH (ref 0–99)
Triglycerides: 216 mg/dL — ABNORMAL HIGH (ref 0–149)
VLDL Cholesterol Cal: 38 mg/dL (ref 5–40)

## 2019-01-21 LAB — VITAMIN B12: Vitamin B-12: 566 pg/mL (ref 232–1245)

## 2019-01-21 LAB — TSH: TSH: 1.69 u[IU]/mL (ref 0.450–4.500)

## 2019-01-21 LAB — HEMOGLOBIN A1C
Est. average glucose Bld gHb Est-mCnc: 114 mg/dL
Hgb A1c MFr Bld: 5.6 % (ref 4.8–5.6)

## 2019-01-21 LAB — VITAMIN D 25 HYDROXY (VIT D DEFICIENCY, FRACTURES): Vit D, 25-Hydroxy: 13 ng/mL — ABNORMAL LOW (ref 30.0–100.0)

## 2019-01-25 ENCOUNTER — Encounter: Payer: Self-pay | Admitting: Family Medicine

## 2019-01-25 ENCOUNTER — Other Ambulatory Visit: Payer: Self-pay | Admitting: Family Medicine

## 2019-01-25 DIAGNOSIS — E559 Vitamin D deficiency, unspecified: Secondary | ICD-10-CM

## 2019-01-25 MED ORDER — VITAMIN D (ERGOCALCIFEROL) 1.25 MG (50000 UNIT) PO CAPS: 50000.0000 [IU] | ORAL_CAPSULE | ORAL | 6 refills | Status: AC

## 2019-02-19 ENCOUNTER — Encounter: Payer: Self-pay | Admitting: Family Medicine

## 2019-02-19 ENCOUNTER — Ambulatory Visit (INDEPENDENT_AMBULATORY_CARE_PROVIDER_SITE_OTHER): Payer: BLUE CROSS/BLUE SHIELD | Admitting: Family Medicine

## 2019-02-19 ENCOUNTER — Other Ambulatory Visit: Payer: Self-pay

## 2019-02-19 VITALS — BP 126/61 | HR 78 | Temp 97.8°F | Ht 67.0 in | Wt 229.4 lb

## 2019-02-19 DIAGNOSIS — K219 Gastro-esophageal reflux disease without esophagitis: Secondary | ICD-10-CM | POA: Diagnosis not present

## 2019-02-19 DIAGNOSIS — K51919 Ulcerative colitis, unspecified with unspecified complications: Secondary | ICD-10-CM | POA: Diagnosis not present

## 2019-02-19 DIAGNOSIS — R197 Diarrhea, unspecified: Secondary | ICD-10-CM | POA: Diagnosis not present

## 2019-02-19 DIAGNOSIS — R600 Localized edema: Secondary | ICD-10-CM | POA: Diagnosis not present

## 2019-02-19 DIAGNOSIS — Z09 Encounter for follow-up examination after completed treatment for conditions other than malignant neoplasm: Secondary | ICD-10-CM

## 2019-02-19 DIAGNOSIS — Z0000 Encounter for general adult medical examination without abnormal findings: Secondary | ICD-10-CM

## 2019-02-19 DIAGNOSIS — Z23 Encounter for immunization: Secondary | ICD-10-CM | POA: Diagnosis not present

## 2019-02-19 DIAGNOSIS — R7303 Prediabetes: Secondary | ICD-10-CM

## 2019-02-19 LAB — POCT URINALYSIS DIPSTICK
Bilirubin, UA: NEGATIVE
Blood, UA: NEGATIVE
Glucose, UA: NEGATIVE
Ketones, UA: NEGATIVE
Nitrite, UA: NEGATIVE
Protein, UA: NEGATIVE
Spec Grav, UA: >=1.03 — AB (ref 1.010–1.025)
Urobilinogen, UA: 0.2 E.U./dL
pH, UA: 7 (ref 5.0–8.0)

## 2019-02-19 MED ORDER — METFORMIN HCL 500 MG PO TABS: 500.0000 mg | ORAL_TABLET | Freq: Two times a day (BID) | ORAL | 3 refills | Status: DC

## 2019-02-19 NOTE — Patient Instructions (Signed)
DASH Eating Plan DASH stands for "Dietary Approaches to Stop Hypertension." The DASH eating plan is a healthy eating plan that has been shown to reduce high blood pressure (hypertension). It may also reduce your risk for type 2 diabetes, heart disease, and stroke. The DASH eating plan may also help with weight loss. What are tips for following this plan?  General guidelines  Avoid eating more than 2,300 mg (milligrams) of salt (sodium) a day. If you have hypertension, you may need to reduce your sodium intake to 1,500 mg a day.  Limit alcohol intake to no more than 1 drink a day for nonpregnant women and 2 drinks a day for men. One drink equals 12 oz of beer, 5 oz of wine, or 1 oz of hard liquor.  Work with your health care provider to maintain a healthy body weight or to lose weight. Ask what an ideal weight is for you.  Get at least 30 minutes of exercise that causes your heart to beat faster (aerobic exercise) most days of the week. Activities may include walking, swimming, or biking.  Work with your health care provider or diet and nutrition specialist (dietitian) to adjust your eating plan to your individual calorie needs. Reading food labels   Check food labels for the amount of sodium per serving. Choose foods with less than 5 percent of the Daily Value of sodium. Generally, foods with less than 300 mg of sodium per serving fit into this eating plan.  To find whole grains, look for the word "whole" as the first word in the ingredient list. Shopping  Buy products labeled as "low-sodium" or "no salt added."  Buy fresh foods. Avoid canned foods and premade or frozen meals. Cooking  Avoid adding salt when cooking. Use salt-free seasonings or herbs instead of table salt or sea salt. Check with your health care provider or pharmacist before using salt substitutes.  Do not fry foods. Cook foods using healthy methods such as baking, boiling, grilling, and broiling instead.  Cook with  heart-healthy oils, such as olive, canola, soybean, or sunflower oil. Meal planning  Eat a balanced diet that includes: ? 5 or more servings of fruits and vegetables each day. At each meal, try to fill half of your plate with fruits and vegetables. ? Up to 6-8 servings of whole grains each day. ? Less than 6 oz of lean meat, poultry, or fish each day. A 3-oz serving of meat is about the same size as a deck of cards. One egg equals 1 oz. ? 2 servings of low-fat dairy each day. ? A serving of nuts, seeds, or beans 5 times each week. ? Heart-healthy fats. Healthy fats called Omega-3 fatty acids are found in foods such as flaxseeds and coldwater fish, like sardines, salmon, and mackerel.  Limit how much you eat of the following: ? Canned or prepackaged foods. ? Food that is high in trans fat, such as fried foods. ? Food that is high in saturated fat, such as fatty meat. ? Sweets, desserts, sugary drinks, and other foods with added sugar. ? Full-fat dairy products.  Do not salt foods before eating.  Try to eat at least 2 vegetarian meals each week.  Eat more home-cooked food and less restaurant, buffet, and fast food.  When eating at a restaurant, ask that your food be prepared with less salt or no salt, if possible. What foods are recommended? The items listed may not be a complete list. Talk with your dietitian about   what dietary choices are best for you. Grains Whole-grain or whole-wheat bread. Whole-grain or whole-wheat pasta. Brown rice. Oatmeal. Quinoa. Bulgur. Whole-grain and low-sodium cereals. Pita bread. Low-fat, low-sodium crackers. Whole-wheat flour tortillas. Vegetables Fresh or frozen vegetables (raw, steamed, roasted, or grilled). Low-sodium or reduced-sodium tomato and vegetable juice. Low-sodium or reduced-sodium tomato sauce and tomato paste. Low-sodium or reduced-sodium canned vegetables. Fruits All fresh, dried, or frozen fruit. Canned fruit in natural juice (without  added sugar). Meat and other protein foods Skinless chicken or turkey. Ground chicken or turkey. Pork with fat trimmed off. Fish and seafood. Egg whites. Dried beans, peas, or lentils. Unsalted nuts, nut butters, and seeds. Unsalted canned beans. Lean cuts of beef with fat trimmed off. Low-sodium, lean deli meat. Dairy Low-fat (1%) or fat-free (skim) milk. Fat-free, low-fat, or reduced-fat cheeses. Nonfat, low-sodium ricotta or cottage cheese. Low-fat or nonfat yogurt. Low-fat, low-sodium cheese. Fats and oils Soft margarine without trans fats. Vegetable oil. Low-fat, reduced-fat, or light mayonnaise and salad dressings (reduced-sodium). Canola, safflower, olive, soybean, and sunflower oils. Avocado. Seasoning and other foods Herbs. Spices. Seasoning mixes without salt. Unsalted popcorn and pretzels. Fat-free sweets. What foods are not recommended? The items listed may not be a complete list. Talk with your dietitian about what dietary choices are best for you. Grains Baked goods made with fat, such as croissants, muffins, or some breads. Dry pasta or rice meal packs. Vegetables Creamed or fried vegetables. Vegetables in a cheese sauce. Regular canned vegetables (not low-sodium or reduced-sodium). Regular canned tomato sauce and paste (not low-sodium or reduced-sodium). Regular tomato and vegetable juice (not low-sodium or reduced-sodium). Pickles. Olives. Fruits Canned fruit in a light or heavy syrup. Fried fruit. Fruit in cream or butter sauce. Meat and other protein foods Fatty cuts of meat. Ribs. Fried meat. Bacon. Sausage. Bologna and other processed lunch meats. Salami. Fatback. Hotdogs. Bratwurst. Salted nuts and seeds. Canned beans with added salt. Canned or smoked fish. Whole eggs or egg yolks. Chicken or turkey with skin. Dairy Whole or 2% milk, cream, and half-and-half. Whole or full-fat cream cheese. Whole-fat or sweetened yogurt. Full-fat cheese. Nondairy creamers. Whipped toppings.  Processed cheese and cheese spreads. Fats and oils Butter. Stick margarine. Lard. Shortening. Ghee. Bacon fat. Tropical oils, such as coconut, palm kernel, or palm oil. Seasoning and other foods Salted popcorn and pretzels. Onion salt, garlic salt, seasoned salt, table salt, and sea salt. Worcestershire sauce. Tartar sauce. Barbecue sauce. Teriyaki sauce. Soy sauce, including reduced-sodium. Steak sauce. Canned and packaged gravies. Fish sauce. Oyster sauce. Cocktail sauce. Horseradish that you find on the shelf. Ketchup. Mustard. Meat flavorings and tenderizers. Bouillon cubes. Hot sauce and Tabasco sauce. Premade or packaged marinades. Premade or packaged taco seasonings. Relishes. Regular salad dressings. Where to find more information:  National Heart, Lung, and Blood Institute: www.nhlbi.nih.gov  American Heart Association: www.heart.org Summary  The DASH eating plan is a healthy eating plan that has been shown to reduce high blood pressure (hypertension). It may also reduce your risk for type 2 diabetes, heart disease, and stroke.  With the DASH eating plan, you should limit salt (sodium) intake to 2,300 mg a day. If you have hypertension, you may need to reduce your sodium intake to 1,500 mg a day.  When on the DASH eating plan, aim to eat more fresh fruits and vegetables, whole grains, lean proteins, low-fat dairy, and heart-healthy fats.  Work with your health care provider or diet and nutrition specialist (dietitian) to adjust your eating plan to your   individual calorie needs. This information is not intended to replace advice given to you by your health care provider. Make sure you discuss any questions you have with your health care provider. Document Released: 04/11/2011 Document Revised: 04/04/2017 Document Reviewed: 04/15/2016 Elsevier Patient Education  2020 [place] for [employer] Calories are units of energy. Your body needs a certain amount of  calories from food to keep you going throughout the day. When you eat more calories than your body needs, your body stores the extra calories as fat. When you eat fewer calories than your body needs, your body burns fat to get the energy it needs. Calorie counting means keeping track of how many calories you eat and drink each day. Calorie counting can be helpful if you need to lose weight. If you make sure to eat fewer calories than your body needs, you should lose weight. Ask your health care provider what a healthy weight is for you. For calorie counting to work, you will need to eat the right number of calories in a day in order to lose a healthy amount of weight per week. A dietitian can help you determine how many calories you need in a day and will give you suggestions on how to reach your calorie goal.  A healthy amount of weight to lose per week is usually 1-2 lb (0.5-0.9 kg). This usually means that your daily calorie intake should be reduced by 500-750 calories.  Eating 1,200 - 1,500 calories per day can help most women lose weight.  Eating 1,500 - 1,800 calories per day can help most men lose weight. What is my plan? My goal is to have __________ calories per day. If I have this many calories per day, I should lose around __________ pounds per week. What do I need to know about calorie counting? In order to meet your daily calorie goal, you will need to:  Find out how many calories are in each food you would like to eat. Try to do this before you eat.  Decide how much of the food you plan to eat.  Write down what you ate and how many calories it had. Doing this is called keeping a food log. To successfully lose weight, it is important to balance calorie counting with a healthy lifestyle that includes regular activity. Aim for 150 minutes of moderate exercise (such as walking) or 75 minutes of vigorous exercise (such as running) each week. Where do I find calorie information?  The  number of calories in a food can be found on a Nutrition Facts label. If a food does not have a Nutrition Facts label, try to look up the calories online or ask your dietitian for help. Remember that calories are listed per serving. If you choose to have more than one serving of a food, you will have to multiply the calories per serving by the amount of servings you plan to eat. For example, the label on a package of bread might say that a serving size is 1 slice and that there are 90 calories in a serving. If you eat 1 slice, you will have eaten 90 calories. If you eat 2 slices, you will have eaten 180 calories. How do I keep a food log? Immediately after each meal, record the following information in your food log:  What you ate. Don't forget to include toppings, sauces, and other extras on the food.  How much you ate. This can be measured in cups,  ounces, or number of items.  How many calories each food and drink had.  The total number of calories in the meal. Keep your food log near you, such as in a small notebook in your pocket, or use a mobile app or website. Some programs will calculate calories for you and show you how many calories you have left for the day to meet your goal. What are some calorie counting tips?   Use your calories on foods and drinks that will fill you up and not leave you hungry: ? Some examples of foods that fill you up are nuts and nut butters, vegetables, lean proteins, and high-fiber foods like whole grains. High-fiber foods are foods with more than 5 g fiber per serving. ? Drinks such as sodas, specialty coffee drinks, alcohol, and juices have a lot of calories, yet do not fill you up.  Eat nutritious foods and avoid empty calories. Empty calories are calories you get from foods or beverages that do not have many vitamins or protein, such as candy, sweets, and soda. It is better to have a nutritious high-calorie food (such as an avocado) than a food with few  nutrients (such as a bag of chips).  Know how many calories are in the foods you eat most often. This will help you calculate calorie counts faster.  Pay attention to calories in drinks. Low-calorie drinks include water and unsweetened drinks.  Pay attention to nutrition labels for "low fat" or "fat free" foods. These foods sometimes have the same amount of calories or more calories than the full fat versions. They also often have added sugar, starch, or salt, to make up for flavor that was removed with the fat.  Find a way of tracking calories that works for you. Get creative. Try different apps or programs if writing down calories does not work for you. What are some portion control tips?  Know how many calories are in a serving. This will help you know how many servings of a certain food you can have.  Use a measuring cup to measure serving sizes. You could also try weighing out portions on a kitchen scale. With time, you will be able to estimate serving sizes for some foods.  Take some time to put servings of different foods on your favorite plates, bowls, and cups so you know what a serving looks like.  Try not to eat straight from a bag or box. Doing this can lead to overeating. Put the amount you would like to eat in a cup or on a plate to make sure you are eating the right portion.  Use smaller plates, glasses, and bowls to prevent overeating.  Try not to multitask (for example, watch TV or use your computer) while eating. If it is time to eat, sit down at a table and enjoy your food. This will help you to know when you are full. It will also help you to be aware of what you are eating and how much you are eating. What are tips for following this plan? Reading food labels  Check the calorie count compared to the serving size. The serving size may be smaller than what you are used to eating.  Check the source of the calories. Make sure the food you are eating is high in vitamins and  protein and low in saturated and trans fats. Shopping  Read nutrition labels while you shop. This will help you make healthy decisions before you decide to purchase your  food.  Make a grocery list and stick to it. Cooking  Try to cook your favorite foods in a healthier way. For example, try baking instead of frying.  Use low-fat dairy products. Meal planning  Use more fruits and vegetables. Half of your plate should be fruits and vegetables.  Include lean proteins like poultry and fish. How do I count calories when eating out?  Ask for smaller portion sizes.  Consider sharing an entree and sides instead of getting your own entree.  If you get your own entree, eat only half. Ask for a box at the beginning of your meal and put the rest of your entree in it so you are not tempted to eat it.  If calories are listed on the menu, choose the lower calorie options.  Choose dishes that include vegetables, fruits, whole grains, low-fat dairy products, and lean protein.  Choose items that are boiled, broiled, grilled, or steamed. Stay away from items that are buttered, battered, fried, or served with cream sauce. Items labeled "crispy" are usually fried, unless stated otherwise.  Choose water, low-fat milk, unsweetened iced tea, or other drinks without added sugar. If you want an alcoholic beverage, choose a lower calorie option such as a glass of wine or light beer.  Ask for dressings, sauces, and syrups on the side. These are usually high in calories, so you should limit the amount you eat.  If you want a salad, choose a garden salad and ask for grilled meats. Avoid extra toppings like bacon, cheese, or fried items. Ask for the dressing on the side, or ask for olive oil and vinegar or lemon to use as dressing.  Estimate how many servings of a food you are given. For example, a serving of cooked rice is  cup or about the size of half a baseball. Knowing serving sizes will help you be aware  of how much food you are eating at restaurants. The list below tells you how big or small some common portion sizes are based on everyday objects: ? 1 oz-4 stacked dice. ? 3 oz-1 deck of cards. ? 1 tsp-1 die. ? 1 Tbsp- a ping-pong ball. ? 2 Tbsp-1 ping-pong ball. ?  cup- baseball. ? 1 cup-1 baseball. Summary  Calorie counting means keeping track of how many calories you eat and drink each day. If you eat fewer calories than your body needs, you should lose weight.  A healthy amount of weight to lose per week is usually 1-2 lb (0.5-0.9 kg). This usually means reducing your daily calorie intake by 500-750 calories.  The number of calories in a food can be found on a Nutrition Facts label. If a food does not have a Nutrition Facts label, try to look up the calories online or ask your dietitian for help.  Use your calories on foods and drinks that will fill you up, and not on foods and drinks that will leave you hungry.  Use smaller plates, glasses, and bowls to prevent overeating. This information is not intended to replace advice given to you by your health care provider. Make sure you discuss any questions you have with your health care provider. Document Released: 04/22/2005 Document Revised: 01/09/2018 Document Reviewed: 03/22/2016 Elsevier Patient Education  2020 [place]. Prediabetes Eating Plan Prediabetes is a condition that causes blood sugar (glucose) levels to be higher than normal. This increases the risk for developing diabetes. In order to prevent diabetes from developing, your health care provider may recommend a diet  and other lifestyle changes to help you:  Control your blood glucose levels.  Improve your cholesterol levels.  Manage your blood pressure. Your health care provider may recommend working with a diet and nutrition specialist (dietitian) to make a meal plan that is best for you. What are tips for following this plan? Lifestyle  Set weight loss goals  with the help of your health care team. It is recommended that most people with prediabetes lose 7% of their current body weight.  Exercise for at least 30 minutes at least 5 days a week.  Attend a support group or seek ongoing support from a mental health counselor.  Take over-the-counter and prescription medicines only as told by your health care provider. Reading food labels  Read food labels to check the amount of fat, salt (sodium), and sugar in prepackaged foods. Avoid foods that have: ? Saturated fats. ? Trans fats. ? Added sugars.  Avoid foods that have more than 300 milligrams (mg) of sodium per serving. Limit your daily sodium intake to less than 2,300 mg each day. Shopping  Avoid buying pre-made and processed foods. Cooking  Cook with olive oil. Do not use butter, lard, or ghee.  Bake, broil, grill, or boil foods. Avoid frying. Meal planning   Work with your dietitian to develop an eating plan that is right for you. This may include: ? Tracking how many calories you take in. Use a food diary, notebook, or mobile application to track what you eat at each meal. ? Using the glycemic index (GI) to plan your meals. The index tells you how quickly a food will raise your blood glucose. Choose low-GI foods. These foods take a longer time to raise blood glucose.  Consider following a Mediterranean diet. This diet includes: ? Several servings each day of fresh fruits and vegetables. ? Eating fish at least twice a week. ? Several servings each day of whole grains, beans, nuts, and seeds. ? Using olive oil instead of other fats. ? Moderate alcohol consumption. ? Eating small amounts of red meat and whole-fat dairy.  If you have high blood pressure, you may need to limit your sodium intake or follow a diet such as the DASH eating plan. DASH is an eating plan that aims to lower high blood pressure. What foods are recommended? The items listed below may not be a complete list.  Talk with your dietitian about what dietary choices are best for you. Grains Whole grains, such as whole-wheat or whole-grain breads, crackers, cereals, and pasta. Unsweetened oatmeal. Bulgur. Barley. Quinoa. Brown rice. Corn or whole-wheat flour tortillas or taco shells. Vegetables Lettuce. Spinach. Peas. Beets. Cauliflower. Cabbage. Broccoli. Carrots. Tomatoes. Squash. Eggplant. Herbs. Peppers. Onions. Cucumbers. Brussels sprouts. Fruits Berries. Bananas. Apples. Oranges. Grapes. Papaya. Mango. Pomegranate. Kiwi. Grapefruit. Cherries. Meats and other protein foods Seafood. Poultry without skin. Lean cuts of pork and beef. Tofu. Eggs. Nuts. Beans. Dairy Low-fat or fat-free dairy products, such as yogurt, cottage cheese, and cheese. Beverages Water. Tea. Coffee. Sugar-free or diet soda. Seltzer water. Lowfat or no-fat milk. Milk alternatives, such as soy or almond milk. Fats and oils Olive oil. Canola oil. Sunflower oil. Grapeseed oil. Avocado. Walnuts. Sweets and desserts Sugar-free or low-fat pudding. Sugar-free or low-fat ice cream and other frozen treats. Seasoning and other foods Herbs. Sodium-free spices. Mustard. Relish. Low-fat, low-sugar ketchup. Low-fat, low-sugar barbecue sauce. Low-fat or fat-free mayonnaise. What foods are not recommended? The items listed below may not be a complete list. Talk with your dietitian  about what dietary choices are best for you. Grains Refined white flour and flour products, such as bread, pasta, snack foods, and cereals. Vegetables Canned vegetables. Frozen vegetables with butter or cream sauce. Fruits Fruits canned with syrup. Meats and other protein foods Fatty cuts of meat. Poultry with skin. Breaded or fried meat. Processed meats. Dairy Full-fat yogurt, cheese, or milk. Beverages Sweetened drinks, such as sweet iced tea and soda. Fats and oils Butter. Lard. Ghee. Sweets and desserts Baked goods, such as cake, cupcakes, pastries,  cookies, and cheesecake. Seasoning and other foods Spice mixes with added salt. Ketchup. Barbecue sauce. Mayonnaise. Summary  To prevent diabetes from developing, you may need to make diet and other lifestyle changes to help control blood sugar, improve cholesterol levels, and manage your blood pressure.  Set weight loss goals with the help of your health care team. It is recommended that most people with prediabetes lose 7 percent of their current body weight.  Consider following a Mediterranean diet that includes plenty of fresh fruits and vegetables, whole grains, beans, nuts, seeds, fish, lean meat, low-fat dairy, and healthy oils. This information is not intended to replace advice given to you by your health care provider. Make sure you discuss any questions you have with your health care provider. Document Released: 09/06/2014 Document Revised: 08/14/2018 Document Reviewed: 06/26/2016 Elsevier Patient Education  2020 [employer].

## 2019-02-19 NOTE — Progress Notes (Signed)
Patient [facility]   Established Patient Office Visit  Subjective:  Patient ID: Crystal Benton, female    DOB: 1960/10/31  Age: 58 y.o. MRN: [id number]  CC:  Chief Complaint  Patient presents with  . Follow-up    One mth follow HTN    HPI Crystal Benton is a 58 year old female who presents for Follow Up today.   Past Medical History:  Diagnosis Date  . Acid reflux 01/2019  . Diarrhea   . Elevated cholesterol with elevated triglycerides 01/2019  . GERD (gastroesophageal reflux disease)   . Ulcerative colitis   . Vitamin deficiency 01/2019    Current Status: Since her last office visit, she is doing well with no complaints. She was previously referred to GI for Ulcerative Colitis and general signs and symptoms. She is scheduled for virtual office visit with their office on 03/01/2019. She states that episodes of bloody diarrhea have not been as frequent lately. She has occasional scant blood in her stools. She denies nausea and vomiting, and constipation. She has no reports of blood in stools, dysuria and hematuria. She denies visual changes, chest pain, cough, shortness of breath, heart palpitations, and falls. She has occasional headaches and dizziness with position changes. Denies severe headaches, confusion, seizures, double vision, and blurred vision,  She denies fevers, chills, fatigue, recent infections, weight loss, and night sweats. No depression or anxiety reported today. She denies pain today.   Past Surgical History:  Procedure Laterality Date  . ABDOMINAL SURGERY    . BIOPSY  05/29/2018   Procedure: BIOPSY;  Surgeon: Carol Ada, MD;  Location: [facility];  Service: Endoscopy;;  . COLON SURGERY    . COLONOSCOPY WITH PROPOFOL N/A 05/29/2018   Procedure: COLONOSCOPY WITH PROPOFOL;  Surgeon: Carol Ada, MD;  Location: [facility];  Service: Endoscopy;  Laterality: N/A;  . POLYPECTOMY  05/29/2018   Procedure: POLYPECTOMY;   Surgeon: Carol Ada, MD;  Location: [facility] ENDOSCOPY;  Service: Endoscopy;;    Family History  Problem Relation Age of Onset  . Hypertension Mother   . Stroke Mother   . Seizures Mother   . Multiple sclerosis Sister     Social History   Socioeconomic History  . Marital status: Divorced    Spouse name: Not on file  . Number of children: Not on file  . Years of education: Not on file  . Highest education level: Not on file  Occupational History  . Not on file  Social Needs  . Financial resource strain: Not on file  . Food insecurity    Worry: Not on file    Inability: Not on file  . Transportation needs    Medical: Not on file    Non-medical: Not on file  Tobacco Use  . Smoking status: Never Smoker  . Smokeless tobacco: Never Used  Substance and Sexual Activity  . Alcohol use: Yes    Comment: occ  . Drug use: No  . Sexual activity: Yes  Lifestyle  . Physical activity    Days per week: Not on file    Minutes per session: Not on file  . Stress: Not on file  Relationships  . Social Herbalist on phone: Not on file    Gets together: Not on file    Attends religious service: Not on file    Active member of club or organization: Not on file    Attends meetings of clubs or organizations:  Not on file    Relationship status: Not on file  . Intimate partner violence    Fear of current or ex partner: Not on file    Emotionally abused: Not on file    Physically abused: Not on file    Forced sexual activity: Not on file  Other Topics Concern  . Not on file  Social History Narrative  . Not on file    Outpatient Medications Prior to Visit  Medication Sig Dispense Refill  . furosemide (LASIX) 20 MG tablet Take 1 tablet (20 mg total) by mouth daily. 30 tablet 3  . loperamide (IMODIUM A-D) 2 MG tablet Take 1 tablet (2 mg total) by mouth 4 (four) times daily as needed for diarrhea or loose stools. (Up to 8 tablets a day). (Patient not taking: Reported on  02/19/2019) 30 tablet 4  . omeprazole (PRILOSEC) 20 MG capsule Take 20 mg by mouth daily.    . Vitamin D, Ergocalciferol, (DRISDOL) 1.25 MG (50000 UT) CAPS capsule Take 1 capsule (50,000 Units total) by mouth every 7 (seven) days. 5 capsule 6  . HYDROcodone-acetaminophen (NORCO) 5-325 MG tablet Take 1-2 tablets by mouth every 6 (six) hours as needed. (Patient not taking: Reported on 01/11/2019) 15 tablet 0  . mesalamine (CANASA) 1000 MG suppository Place 1 suppository (1,000 mg total) rectally at bedtime. (Patient not taking: Reported on 01/11/2019) 30 suppository 12  . mesalamine (CANASA) 1000 MG suppository Place 1 suppository (1,000 mg total) rectally at bedtime. (Patient not taking: Reported on 02/19/2019) 30 suppository 12  . predniSONE (DELTASONE) 20 MG tablet 2 tabs po daily x 4 days (Patient not taking: Reported on 01/20/2019) 8 tablet 0   No facility-administered medications prior to visit.     Allergies  Allergen Reactions  . Onion Anaphylaxis  . Flagyl [Metronidazole] Other (See Comments)    Makes her feel like she's dying  . Metoclopramide Hcl Other (See Comments)    cramping    ROS Review of Systems  Constitutional: Negative.   HENT: Negative.   Eyes: Negative.   Respiratory: Negative.   Cardiovascular: Negative.   Gastrointestinal: Positive for blood in stool and diarrhea. Negative for abdominal distention.  Endocrine: Negative.   Genitourinary: Negative.   Musculoskeletal: Positive for arthralgias (generalized).  Skin: Negative.   Allergic/Immunologic: Negative.   Neurological: Negative.   Hematological: Negative.   Psychiatric/Behavioral: Negative.       Objective:    Physical Exam  Constitutional: She is oriented to person, place, and time. She appears well-developed and well-nourished.  HENT:  Head: Normocephalic and atraumatic.  Eyes: Conjunctivae are normal.  Neck: Normal range of motion. Neck supple.  Cardiovascular: Normal rate, regular rhythm, normal  heart sounds and intact distal pulses.  Pulmonary/Chest: Effort normal and breath sounds normal.  Abdominal: Soft. Bowel sounds are normal. She exhibits distension (obese).  Musculoskeletal: Normal range of motion.  Neurological: She is alert and oriented to person, place, and time. She has normal reflexes.  Skin: Skin is warm.  Psychiatric: She has a normal mood and affect. Her behavior is normal. Judgment and thought content normal.  Nursing note and vitals reviewed.   BP 126/61 (BP Location: Left Arm, Patient Position: Sitting, Cuff Size: Large)   Pulse 78   Temp 97.8 F (36.6 C)   Ht 5\' 7"  (1.702 m)   Wt 229 lb 6.4 oz (104.1 kg)   LMP 05/12/2013   SpO2 97%   BMI 35.93 kg/m  Wt Readings from Last 3 Encounters:  02/19/19 229 lb 6.4 oz (104.1 kg)  01/20/19 230 lb 3.2 oz (104.4 kg)  01/11/19 217 lb (98.4 kg)     Health Maintenance Due  Topic Date Due  . Hepatitis C Screening  04/15/1962  . TETANUS/TDAP  01/04/1981  . PAP SMEAR-Modifier  01/05/1983  . MAMMOGRAM  01/05/2012    There are no preventive care reminders to display for this patient.  Lab Results  Component Value Date   TSH 1.690 01/20/2019   Lab Results  Component Value Date   WBC 4.9 01/11/2019   HGB 12.8 01/11/2019   HCT 39.3 01/11/2019   MCV 98.0 01/11/2019   PLT 263 01/11/2019   Lab Results  Component Value Date   NA 142 01/11/2019   K 3.9 01/11/2019   CO2 25 01/11/2019   GLUCOSE 93 01/11/2019   BUN 16 01/11/2019   CREATININE 0.75 01/11/2019   BILITOT 0.3 01/11/2019   ALKPHOS 95 01/11/2019   AST 18 01/11/2019   ALT 21 01/11/2019   PROT 7.5 01/11/2019   ALBUMIN 4.4 01/11/2019   CALCIUM 9.7 01/11/2019   ANIONGAP 9 01/11/2019   Lab Results  Component Value Date   CHOL 219 (H) 01/20/2019   Lab Results  Component Value Date   HDL 58 01/20/2019   Lab Results  Component Value Date   LDLCALC 123 (H) 01/20/2019   Lab Results  Component Value Date   TRIG 216 (H) 01/20/2019   Lab  Results  Component Value Date   CHOLHDL 3.8 01/20/2019   Lab Results  Component Value Date   HGBA1C 5.6 01/20/2019      Assessment & Plan:   1. Ulcerative colitis with complication, unspecified location ([place]) Stable.   2. Bloody diarrhea Occasional.  3. Bilateral lower extremity edema  4. Gastroesophageal reflux disease without esophagitis - H. pylori breath test  5. Pre-diabetes - metFORMIN (GLUCOPHAGE) 500 MG tablet; Take 1 tablet (500 mg total) by mouth 2 (two) times daily with a meal.  Dispense: 60 tablet; Refill: 3  6. Healthcare maintenance - POCT urinalysis dipstick  7. Follow up She will follow up in 3 months.  - POCT urinalysis dipstick  Meds ordered this encounter  Medications  . metFORMIN (GLUCOPHAGE) 500 MG tablet    Sig: Take 1 tablet (500 mg total) by mouth 2 (two) times daily with a meal.    Dispense:  60 tablet    Refill:  3    Orders Placed This Encounter  Procedures  . Flu Vaccine QUAD 6+ mos PF IM (Fluarix Quad PF)  . H. pylori breath test  . POCT urinalysis dipstick    Referral Orders  No referral(s) requested today    Kathe Becton,  MSN, FNP-BC [place],  [id number] [phone] fax  Problem List Items Addressed This Visit      Digestive   Ulcerative colitis with complication ([place]) - Primary     Other   Diarrhea    Other Visit Diagnoses    Bilateral lower extremity edema       Gastroesophageal reflux disease without esophagitis       Relevant Orders   H. pylori breath test (Completed)   Pre-diabetes       Relevant Medications   metFORMIN (GLUCOPHAGE) 500 MG tablet   Healthcare maintenance       Relevant Orders   POCT urinalysis dipstick (Completed)   Follow up  Relevant Orders   POCT urinalysis dipstick (Completed)      Meds ordered this encounter  Medications  . metFORMIN (GLUCOPHAGE) 500 MG  tablet    Sig: Take 1 tablet (500 mg total) by mouth 2 (two) times daily with a meal.    Dispense:  60 tablet    Refill:  3    Follow-up: Return in about 3 months (around 05/22/2019).    Azzie Glatter, FNP

## 2019-02-21 DIAGNOSIS — K219 Gastro-esophageal reflux disease without esophagitis: Secondary | ICD-10-CM | POA: Insufficient documentation

## 2019-02-21 DIAGNOSIS — R7303 Prediabetes: Secondary | ICD-10-CM | POA: Insufficient documentation

## 2019-02-21 DIAGNOSIS — R600 Localized edema: Secondary | ICD-10-CM | POA: Insufficient documentation

## 2019-02-21 LAB — H. PYLORI BREATH TEST: H pylori Breath Test: NEGATIVE

## 2019-02-28 ENCOUNTER — Encounter: Payer: Self-pay | Admitting: Family Medicine

## 2019-02-28 ENCOUNTER — Other Ambulatory Visit: Payer: Self-pay | Admitting: Family Medicine

## 2019-02-28 DIAGNOSIS — K219 Gastro-esophageal reflux disease without esophagitis: Secondary | ICD-10-CM

## 2019-02-28 MED ORDER — OMEPRAZOLE 20 MG PO CPDR: 20.0000 mg | DELAYED_RELEASE_CAPSULE | Freq: Every day | ORAL | 3 refills | Status: DC

## 2019-03-29 ENCOUNTER — Other Ambulatory Visit: Payer: Self-pay

## 2019-03-29 DIAGNOSIS — Z20822 Contact with and (suspected) exposure to covid-19: Secondary | ICD-10-CM

## 2019-03-30 LAB — NOVEL CORONAVIRUS, NAA: SARS-CoV-2, NAA: NOT DETECTED

## 2019-04-06 DIAGNOSIS — U071 COVID-19: Secondary | ICD-10-CM

## 2019-04-06 DIAGNOSIS — J1282 Pneumonia due to coronavirus disease 2019: Secondary | ICD-10-CM

## 2019-04-06 HISTORY — DX: Pneumonia due to coronavirus disease 2019: J12.82

## 2019-04-06 HISTORY — DX: COVID-19: U07.1

## 2019-04-08 ENCOUNTER — Other Ambulatory Visit: Payer: Self-pay

## 2019-04-08 DIAGNOSIS — Z20822 Contact with and (suspected) exposure to covid-19: Secondary | ICD-10-CM

## 2019-04-10 ENCOUNTER — Other Ambulatory Visit: Payer: Self-pay

## 2019-04-10 ENCOUNTER — Emergency Department (HOSPITAL_COMMUNITY): Payer: BLUE CROSS/BLUE SHIELD

## 2019-04-10 ENCOUNTER — Emergency Department (HOSPITAL_COMMUNITY)
Admission: EM | Admit: 2019-04-10 | Discharge: 2019-04-10 | Disposition: A | Discharge status: Home / Self Care | Payer: BLUE CROSS/BLUE SHIELD | Attending: Emergency Medicine | Admitting: Emergency Medicine

## 2019-04-10 ENCOUNTER — Encounter (HOSPITAL_COMMUNITY): Payer: Self-pay

## 2019-04-10 DIAGNOSIS — R519 Headache, unspecified: Secondary | ICD-10-CM | POA: Diagnosis present

## 2019-04-10 DIAGNOSIS — Z79899 Other long term (current) drug therapy: Secondary | ICD-10-CM | POA: Diagnosis not present

## 2019-04-10 DIAGNOSIS — U071 COVID-19: Secondary | ICD-10-CM | POA: Insufficient documentation

## 2019-04-10 DIAGNOSIS — Z7984 Long term (current) use of oral hypoglycemic drugs: Secondary | ICD-10-CM | POA: Diagnosis not present

## 2019-04-10 LAB — BASIC METABOLIC PANEL
Anion gap: 10 (ref 5–15)
BUN: 12 mg/dL (ref 6–20)
CO2: 25 mmol/L (ref 22–32)
Calcium: 8.9 mg/dL (ref 8.9–10.3)
Chloride: 105 mmol/L (ref 98–111)
Creatinine, Ser: 0.67 mg/dL (ref 0.44–1.00)
GFR calc Af Amer: >60 mL/min (ref >60)
GFR calc non Af Amer: >60 mL/min (ref >60)
Glucose, Bld: 107 mg/dL — ABNORMAL HIGH (ref 70–99)
Potassium: 3.4 mmol/L — ABNORMAL LOW (ref 3.5–5.1)
Sodium: 140 mmol/L (ref 135–145)

## 2019-04-10 LAB — CBC WITH DIFFERENTIAL/PLATELET
Abs Immature Granulocytes: 0.03 10*3/uL (ref 0.00–0.07)
Basophils Absolute: 0 10*3/uL (ref 0.0–0.1)
Basophils Relative: 0 %
Eosinophils Absolute: 0 10*3/uL (ref 0.0–0.5)
Eosinophils Relative: 0 %
HCT: 42.3 % (ref 36.0–46.0)
Hemoglobin: 14.1 g/dL (ref 12.0–15.0)
Immature Granulocytes: 1 %
Lymphocytes Relative: 22 %
Lymphs Abs: 1.4 10*3/uL (ref 0.7–4.0)
MCH: 32.3 pg (ref 26.0–34.0)
MCHC: 33.3 g/dL (ref 30.0–36.0)
MCV: 97 fL (ref 80.0–100.0)
Monocytes Absolute: 0.3 10*3/uL (ref 0.1–1.0)
Monocytes Relative: 5 %
Neutro Abs: 4.7 10*3/uL (ref 1.7–7.7)
Neutrophils Relative %: 72 %
Platelets: 241 10*3/uL (ref 150–400)
RBC: 4.36 MIL/uL (ref 3.87–5.11)
RDW: 12.6 % (ref 11.5–15.5)
WBC: 6.5 10*3/uL (ref 4.0–10.5)
nRBC: 0 % (ref 0.0–0.2)

## 2019-04-10 LAB — POC SARS CORONAVIRUS 2 AG -  ED: SARS Coronavirus 2 Ag: POSITIVE — AB

## 2019-04-10 MED ORDER — ACETAMINOPHEN 325 MG PO TABS
650.0000 mg | ORAL_TABLET | Freq: Once | ORAL | Status: AC
  Administered 2019-04-10: 650 mg via ORAL
  Filled 2019-04-10: qty 2

## 2019-04-10 MED ORDER — DEXAMETHASONE SODIUM PHOSPHATE 10 MG/ML IJ SOLN
10.0000 mg | Freq: Once | INTRAMUSCULAR | Status: AC
  Administered 2019-04-10: 10 mg via INTRAVENOUS
  Filled 2019-04-10: qty 1

## 2019-04-10 MED ORDER — AZITHROMYCIN 250 MG PO TABS: 250.0000 mg | ORAL_TABLET | Freq: Every day | ORAL | 0 refills | Status: AC

## 2019-04-10 MED ORDER — SODIUM CHLORIDE 0.9 % IV BOLUS
1000.0000 mL | Freq: Once | INTRAVENOUS | Status: AC
  Administered 2019-04-10: 1000 mL via INTRAVENOUS

## 2019-04-10 MED ORDER — AZITHROMYCIN 250 MG PO TABS: 500.0000 mg | ORAL_TABLET | Freq: Once | ORAL | Status: DC

## 2019-04-10 NOTE — ED Notes (Signed)
Per PA the bolus needs to finish before DC

## 2019-04-10 NOTE — ED Provider Notes (Signed)
[facility] DEPT Provider Note   CSN: [id number] Arrival date & time: 04/10/19  1220     History   Chief Complaint Chief Complaint  Patient presents with  . Headache  . Cough    HPI Crystal Benton is a 58 y.o. female past medical history significant for prediabetes on Metformin, ulcerative colitis presents to emergency department today with chief complaint of headache and cough x 1 week.  Her symptoms include productive cough, headache, nasal congestion, diarrhea, no sense of taste, subjective fever and chills. She has had "too many episodes of diarrhea to count." Denies blood in stool.  She has decreased PO intake. She has been taking Nyquil with minimal symptom relief. She has mild headache that feels like headaches she has had in the past. She had a covid exposure at work x 2 weeks and was tested with a negative result. She denies chest pain, abdominal pain, shortness of breath,  urinary symptoms, neck pain, rash, visual changes. History provided by patient with additional history obtained from chart review.     Past Medical History:  Diagnosis Date  . Acid reflux 01/2019  . Diarrhea   . Elevated cholesterol with elevated triglycerides 01/2019  . GERD (gastroesophageal reflux disease)   . Ulcerative colitis   . Vitamin deficiency 01/2019    Patient Active Problem List   Diagnosis Date Noted  . Bilateral lower extremity edema 02/21/2019  . Gastroesophageal reflux disease without esophagitis 02/21/2019  . Pre-diabetes 02/21/2019  . Ulcerative colitis with complication ([place]) 81/44/8185  . Rectal bleeding 01/21/2019  . Diarrhea 01/21/2019  . Dehydration 05/28/2018  . Ulcerative colitis, rectosigmoid, with rectal bleeding ([place]) 05/26/2018  . UNSPECIFIED ANEMIA 01/24/2009  . IBD 01/24/2009    Past Surgical History:  Procedure Laterality Date  . ABDOMINAL SURGERY    . BIOPSY  05/29/2018   Procedure: BIOPSY;  Surgeon: Carol Ada, MD;   Location: [facility];  Service: Endoscopy;;  . COLON SURGERY    . COLONOSCOPY WITH PROPOFOL N/A 05/29/2018   Procedure: COLONOSCOPY WITH PROPOFOL;  Surgeon: Carol Ada, MD;  Location: [facility];  Service: Endoscopy;  Laterality: N/A;  . POLYPECTOMY  05/29/2018   Procedure: POLYPECTOMY;  Surgeon: Carol Ada, MD;  Location: [facility] ENDOSCOPY;  Service: Endoscopy;;     OB History   No obstetric history on file.      Home Medications    Prior to Admission medications   Medication Sig Start Date End Date Taking? Authorizing Provider  azithromycin (ZITHROMAX Z-PAK) 250 MG tablet Take 1 tablet (250 mg total) by mouth daily for 4 days. 04/11/19 04/15/19  Jamirah Zelaya E, PA-C  furosemide (LASIX) 20 MG tablet Take 1 tablet (20 mg total) by mouth daily. 01/20/19   Azzie Glatter, FNP  loperamide (IMODIUM A-D) 2 MG tablet Take 1 tablet (2 mg total) by mouth 4 (four) times daily as needed for diarrhea or loose stools. (Up to 8 tablets a day). Patient not taking: Reported on 02/19/2019 01/20/19   Azzie Glatter, FNP  metFORMIN (GLUCOPHAGE) 500 MG tablet Take 1 tablet (500 mg total) by mouth 2 (two) times daily with a meal. 02/19/19   Azzie Glatter, FNP  omeprazole (PRILOSEC) 20 MG capsule Take 1 capsule (20 mg total) by mouth daily. 02/28/19   Azzie Glatter, FNP  Vitamin D, Ergocalciferol, (DRISDOL) 1.25 MG (50000 UT) CAPS capsule Take 1 capsule (50,000 Units total) by mouth every 7 (seven) days. 01/25/19   Azzie Glatter, FNP  Family History Family History  Problem Relation Age of Onset  . Hypertension Mother   . Stroke Mother   . Seizures Mother   . Multiple sclerosis Sister     Social History Social History   Tobacco Use  . Smoking status: Never Smoker  . Smokeless tobacco: Never Used  Substance Use Topics  . Alcohol use: Yes    Comment: occ  . Drug use: No     Allergies   Onion, Flagyl [metronidazole], and Metoclopramide hcl   Review of Systems  Review of Systems All other systems are reviewed and are negative for acute change except as noted in the HPI.   Physical Exam Updated Vital Signs BP 111/78   Pulse 92   Temp 99.3 F (37.4 C) (Oral)   Resp 18   Wt 101.2 kg   LMP 05/12/2013   SpO2 96%   BMI 34.93 kg/m   Physical Exam Vitals signs and nursing note reviewed.  Constitutional:      General: She is not in acute distress.    Appearance: She is not ill-appearing.  HENT:     Head: Normocephalic and atraumatic.     Right Ear: Tympanic membrane and external ear normal.     Left Ear: Tympanic membrane and external ear normal.     Nose: Nose normal.     Mouth/Throat:     Mouth: Mucous membranes are dry.     Pharynx: Oropharynx is clear.  Eyes:     General: No scleral icterus.       Right eye: No discharge.        Left eye: No discharge.     Extraocular Movements: Extraocular movements intact.     Conjunctiva/sclera: Conjunctivae normal.     Pupils: Pupils are equal, round, and reactive to light.  Neck:     Musculoskeletal: Normal range of motion.     Vascular: No JVD.     Comments: No meningeal signs.  Cardiovascular:     Rate and Rhythm: Normal rate and regular rhythm.     Pulses: Normal pulses.          Radial pulses are 2+ on the right side and 2+ on the left side.     Heart sounds: Normal heart sounds.  Pulmonary:     Comments: Lungs clear to auscultation in all fields. Symmetric chest rise. No wheezing, rales, or rhonchi. Abdominal:     Tenderness: There is no right CVA tenderness or left CVA tenderness.     Comments: Abdomen is soft, non-distended, and non-tender in all quadrants. No rigidity, no guarding. No peritoneal signs.  Musculoskeletal: Normal range of motion.  Skin:    General: Skin is warm and dry.     Capillary Refill: Capillary refill takes less than 2 seconds.     Findings: No rash.  Neurological:     Mental Status: She is oriented to person, place, and time.     GCS: GCS eye subscore  is 4. GCS verbal subscore is 5. GCS motor subscore is 6.     Comments: Fluent speech, no facial droop.  Psychiatric:        Behavior: Behavior normal.      ED Treatments / Results  Labs (all labs ordered are listed, but only abnormal results are displayed) Labs Reviewed  BASIC METABOLIC PANEL - Abnormal; Notable for the following components:      Result Value   Potassium 3.4 (*)    Glucose, Bld 107 (*)  All other components within normal limits  POC SARS CORONAVIRUS 2 AG -  ED - Abnormal; Notable for the following components:   SARS Coronavirus 2 Ag POSITIVE (*)    All other components within normal limits  CBC WITH DIFFERENTIAL/PLATELET    EKG None  Radiology Dg Chest 2 View  Result Date: 04/10/2019 CLINICAL DATA:  Cough, headache, congestion. EXAM: CHEST - 2 VIEW COMPARISON:  Chest radiograph dated 05/11/2017. FINDINGS: The heart size and mediastinal contours are within normal limits. Bilateral mid and lower lung predominant interstitial and airspace opacities are noted. There is no pleural effusion or pneumothorax. The visualized skeletal structures are unremarkable. IMPRESSION: Bilateral mid and lower lung predominant interstitial and airspace opacities. Findings may represent multifocal pneumonia including COVID-19 pneumonia. Electronically Signed   By: Zerita Boers M.D.   On: 04/10/2019 13:25    Procedures Procedures (including critical care time)  Medications Ordered in ED Medications  azithromycin (ZITHROMAX) tablet 500 mg (has no administration in time range)  sodium chloride 0.9 % bolus 1,000 mL (1,000 mLs Intravenous New Bag/Given 04/10/19 1636)  acetaminophen (TYLENOL) tablet 650 mg (650 mg Oral Given 04/10/19 1626)  dexamethasone (DECADRON) injection 10 mg (10 mg Intravenous Given 04/10/19 1637)     Initial Impression / Assessment and Plan / ED Course  I have reviewed the triage vital signs and the nursing notes.  Pertinent labs & imaging results that were  available during my care of the patient were reviewed by me and considered in my medical decision making (see chart for details).  Patient seen and examined. Patient nontoxic appearing, in no apparent distress, vitals WNL, low grade temp 99.3.  Lungs are clear to auscultation in all fields.  Abdomen is nontender.  She does have dry mucous membranes.  Chest x-ray viewed by me shows bilateral opacities concerning for multifocal pneumonia.  Labs today with no leukocytosis, no severe electrolyte derangement, no anemia, no renal insufficiency.  POC Covid test is positive.  Patient given IVF, decadron, Tylenol.  She ambulated in the room without respiratory distress, no hypoxia.  SPO2 stayed above 95% on room air.  On reassessment she reports she feels slightly improved for IV fluids.  She p.o. challenge and is tolerating p.o. intake. Will discharge home with prescription for Z-Pak given her x-ray shows possible Covid pneumonia.  Had a long discussion about symptomatic care at home and the importance of hydration. The patient appears reasonably screened and/or stabilized for discharge and I doubt any other medical condition or other [facility] requiring further screening, evaluation, or treatment in the ED at this time prior to discharge. The patient is safe for discharge with strict return precautions discussed. Recommend pcp follow up if symptoms persist.  Crystal Benton was evaluated in Emergency Department on 04/10/2019 for the symptoms described in the history of present illness. She was evaluated in the context of the global COVID-19 pandemic, which necessitated consideration that the patient might be at risk for infection with the SARS-CoV-2 virus that causes COVID-19. Institutional protocols and algorithms that pertain to the evaluation of patients at risk for COVID-19 are in a state of rapid change based on information released by regulatory bodies including the CDC and federal and state organizations. These policies  and algorithms were followed during the patient's care in the ED.  Portions of this note were generated with [employer]. Dictation errors may occur despite best attempts at proofreading.    Final Clinical Impressions(s) / ED Diagnoses   Final diagnoses:  COVID-19 virus infection    ED Discharge Orders         Ordered    azithromycin (ZITHROMAX Z-PAK) 250 MG tablet  Daily     04/10/19 1740           Cherre Robins, PA-C 04/10/19 1814    Varney Biles, MD 04/10/19 2200

## 2019-04-10 NOTE — ED Triage Notes (Signed)
Pt is concerned that she has COVID. Pt states that she has had cough, headache, congestion, diarrhea, no sense of taste.

## 2019-04-10 NOTE — ED Notes (Signed)
Pt ambulated in room for 2 min and O2 sat remained above 95%

## 2019-04-10 NOTE — Discharge Instructions (Signed)
Thank you for allowing [place] to care for you today.   Please return to the emergency department if you have any new or worsening symptoms.  You tested positive for covid-19 today.   Medications- You can take medications to help treat your symptoms: -Tylenol for fever and body aches. Please take as prescribed on the bottle. -Over the coutner cough medicine such as mucinex, robitussin, or other brands.  Treatment- This is a virus and unfortunately there are no antibitotics approved to treat this virus at this time. It is important to monitor your symptoms closely: -You should have a theremometer at home to check your temperature when feeling feverish. -Use a pulse ox meter to measure your oxygen when feeling short of breath.  -If your fever is over 100.4 despite taking tylenol or if your oxygen level drops below 94% these are reasons to rturn to the emergency department for further evaluation. Please call the emergency department before you come to make [place] aware.    We recommend you self-isolate for 10 days and to inform your work/family/friends that you has the virus.  They will need to self-quarantine for 14 days to monitor for symptoms.    Again: symptoms of shortnessf breath, chest pain, difficulty breathing, new onset of confuison, any symptoms that are concerning. And you or the person should come to emergency department for evaluation.   I hope you feel better soon

## 2019-04-11 LAB — NOVEL CORONAVIRUS, NAA: SARS-CoV-2, NAA: DETECTED — AB

## 2019-08-20 ENCOUNTER — Ambulatory Visit: Payer: BLUE CROSS/BLUE SHIELD | Admitting: Family Medicine

## 2019-09-28 ENCOUNTER — Ambulatory Visit (INDEPENDENT_AMBULATORY_CARE_PROVIDER_SITE_OTHER): Payer: BLUE CROSS/BLUE SHIELD | Admitting: Family Medicine

## 2019-09-28 ENCOUNTER — Other Ambulatory Visit: Payer: Self-pay

## 2019-09-28 ENCOUNTER — Encounter: Payer: Self-pay | Admitting: Family Medicine

## 2019-09-28 VITALS — BP 133/76 | HR 80 | Temp 98.0°F | Ht 67.0 in | Wt 232.0 lb

## 2019-09-28 DIAGNOSIS — M1990 Unspecified osteoarthritis, unspecified site: Secondary | ICD-10-CM

## 2019-09-28 DIAGNOSIS — R600 Localized edema: Secondary | ICD-10-CM

## 2019-09-28 DIAGNOSIS — K51919 Ulcerative colitis, unspecified with unspecified complications: Secondary | ICD-10-CM

## 2019-09-28 DIAGNOSIS — Z09 Encounter for follow-up examination after completed treatment for conditions other than malignant neoplasm: Secondary | ICD-10-CM

## 2019-09-28 DIAGNOSIS — R197 Diarrhea, unspecified: Secondary | ICD-10-CM

## 2019-09-28 DIAGNOSIS — K219 Gastro-esophageal reflux disease without esophagitis: Secondary | ICD-10-CM

## 2019-09-28 DIAGNOSIS — R7303 Prediabetes: Secondary | ICD-10-CM | POA: Diagnosis not present

## 2019-09-28 DIAGNOSIS — G4700 Insomnia, unspecified: Secondary | ICD-10-CM

## 2019-09-28 DIAGNOSIS — R8290 Unspecified abnormal findings in urine: Secondary | ICD-10-CM

## 2019-09-28 LAB — POCT URINALYSIS DIPSTICK
Bilirubin, UA: NEGATIVE
Blood, UA: NEGATIVE
Glucose, UA: NEGATIVE
Ketones, UA: NEGATIVE
Nitrite, UA: NEGATIVE
Protein, UA: NEGATIVE
Spec Grav, UA: 1.025 (ref 1.010–1.025)
Urobilinogen, UA: 0.2 E.U./dL
pH, UA: 6 (ref 5.0–8.0)

## 2019-09-28 LAB — GLUCOSE, POCT (MANUAL RESULT ENTRY): POC Glucose: 110 mg/dl — AB (ref 70–99)

## 2019-09-28 LAB — POCT GLYCOSYLATED HEMOGLOBIN (HGB A1C): Hemoglobin A1C: 5.1 % (ref 4.0–5.6)

## 2019-09-28 MED ORDER — OMEPRAZOLE 20 MG PO CPDR: 20.0000 mg | DELAYED_RELEASE_CAPSULE | Freq: Every day | ORAL | 11 refills | Status: DC

## 2019-09-28 MED ORDER — ACETAMINOPHEN 500 MG PO TABS: 500.0000 mg | ORAL_TABLET | Freq: Two times a day (BID) | ORAL | 6 refills | Status: DC | PRN

## 2019-09-28 MED ORDER — METFORMIN HCL 500 MG PO TABS: 500.0000 mg | ORAL_TABLET | Freq: Two times a day (BID) | ORAL | 11 refills | Status: AC

## 2019-09-28 MED ORDER — FUROSEMIDE 20 MG PO TABS: 20.0000 mg | ORAL_TABLET | Freq: Every day | ORAL | 11 refills | Status: DC

## 2019-09-28 MED ORDER — TRAZODONE HCL 100 MG PO TABS: 100.0000 mg | ORAL_TABLET | Freq: Every day | ORAL | 6 refills | Status: AC

## 2019-09-28 NOTE — Progress Notes (Signed)
Patient [name]   Established Patient Office Visit  Subjective:  Patient ID: Crystal Benton, female    DOB: [birth year]  Age: 59 y.o. MRN: [id number]  CC:  Chief Complaint  Patient presents with  . Follow-up    HPI Crystal Benton is a 59 year old female who presents for Follow Up today.   Past Medical History:  Diagnosis Date  . Acid reflux 01/2019  . Arthritis    Right Knee  . Diarrhea   . Elevated cholesterol with elevated triglycerides 01/2019  . GERD (gastroesophageal reflux disease)   . Insomnia   . Pneumonia due to 2019 novel coronavirus 04/2019  . Ulcerative colitis   . Vitamin deficiency 01/2019   Patient Active Problem List   Diagnosis Date Noted  . Bilateral lower extremity edema 02/21/2019  . Gastroesophageal reflux disease without esophagitis 02/21/2019  . Pre-diabetes 02/21/2019  . Ulcerative colitis with complication ([place]) 66/10/3014  . Rectal bleeding 01/21/2019  . Diarrhea 01/21/2019  . Dehydration 05/28/2018  . Ulcerative colitis, rectosigmoid, with rectal bleeding ([place]) 05/26/2018  . UNSPECIFIED ANEMIA 01/24/2009  . IBD 01/24/2009    Current Status: Since her last office visit, she is doing well with no complaints. She continues to follow up with GI for Ulcerative Colitis. She has had occasional recent incidents of scant bloody diarrhea. She denies any other GI problems such as nausea, vomiting, and constipation. She has no reports of blood in stools, dysuria and hematuria. She was diagnosed with Coronavirus in 04/2019. She also had Pneumonia at the time. She has not returned back to work as of yet. She has also been fully vaccinated since 08/05/2019. She denies fevers, chills, fatigue, recent infections, weight loss, and night sweats. She has not had any headaches, visual changes, dizziness, and falls. No chest pain, heart palpitations, cough and shortness of breath reported.  No depression or anxiety, and denies  suicidal ideations, homicidal ideations, or auditory hallucinations. She is taking all medications as prescribed. She denies pain today.   Past Surgical History:  Procedure Laterality Date  . ABDOMINAL SURGERY    . BIOPSY  05/29/2018   Procedure: BIOPSY;  Surgeon: Carol Ada, MD;  Location: [place];  Service: Endoscopy;;  . COLON SURGERY    . COLONOSCOPY WITH PROPOFOL N/A 05/29/2018   Procedure: COLONOSCOPY WITH PROPOFOL;  Surgeon: Carol Ada, MD;  Location: [facility];  Service: Endoscopy;  Laterality: N/A;  . POLYPECTOMY  05/29/2018   Procedure: POLYPECTOMY;  Surgeon: Carol Ada, MD;  Location: [facility] ENDOSCOPY;  Service: Endoscopy;;    Family History  Problem Relation Age of Onset  . Hypertension Mother   . Stroke Mother   . Seizures Mother   . Multiple sclerosis Sister     Social History   Socioeconomic History  . Marital status: Divorced    Spouse name: Not on file  . Number of children: Not on file  . Years of education: Not on file  . Highest education level: Not on file  Occupational History  . Not on file  Tobacco Use  . Smoking status: Never Smoker  . Smokeless tobacco: Never Used  Substance and Sexual Activity  . Alcohol use: Yes    Comment: occ  . Drug use: No  . Sexual activity: Yes  Other Topics Concern  . Not on file  Social History Narrative  . Not on file   Social Determinants of Health   Financial Resource Strain:   . Difficulty  of Paying Living Expenses:   Food Insecurity:   . Worried About Charity fundraiser in the Last Year:   . Arboriculturist in the Last Year:   Transportation Needs:   . Film/video editor (Medical):   Marland Kitchen Lack of Transportation (Non-Medical):   Physical Activity:   . Days of Exercise per Week:   . Minutes of Exercise per Session:   Stress:   . Feeling of Stress :   Social Connections:   . Frequency of Communication with Friends and Family:   . Frequency of Social Gatherings with Friends and Family:   .  Attends Religious Services:   . Active Member of Clubs or Organizations:   . Attends Archivist Meetings:   Marland Kitchen Marital Status:   Intimate Partner Violence:   . Fear of Current or Ex-Partner:   . Emotionally Abused:   Marland Kitchen Physically Abused:   . Sexually Abused:     Outpatient Medications Prior to Visit  Medication Sig Dispense Refill  . metFORMIN (GLUCOPHAGE) 500 MG tablet Take 1 tablet (500 mg total) by mouth 2 (two) times daily with a meal. 60 tablet 3  . loperamide (IMODIUM A-D) 2 MG tablet Take 1 tablet (2 mg total) by mouth 4 (four) times daily as needed for diarrhea or loose stools. (Up to 8 tablets a day). (Patient not taking: Reported on 02/19/2019) 30 tablet 4  . Vitamin D, Ergocalciferol, (DRISDOL) 1.25 MG (50000 UT) CAPS capsule Take 1 capsule (50,000 Units total) by mouth every 7 (seven) days. (Patient not taking: Reported on 09/28/2019) 5 capsule 6  . furosemide (LASIX) 20 MG tablet Take 1 tablet (20 mg total) by mouth daily. (Patient not taking: Reported on 09/28/2019) 30 tablet 3  . omeprazole (PRILOSEC) 20 MG capsule Take 1 capsule (20 mg total) by mouth daily. (Patient not taking: Reported on 09/28/2019) 30 capsule 3   No facility-administered medications prior to visit.    Allergies  Allergen Reactions  . Onion Anaphylaxis  . Flagyl [Metronidazole] Other (See Comments)    Makes her feel like she's dying  . Metoclopramide Hcl Other (See Comments)    cramping    ROS Review of Systems  Constitutional: Negative.   HENT: Negative.   Eyes: Negative.   Respiratory: Positive for shortness of breath (occasionally since Coronavirus).   Cardiovascular: Negative.   Gastrointestinal: Positive for abdominal distention.  Endocrine: Negative.   Genitourinary: Negative.   Musculoskeletal: Positive for arthralgias (generalize joint pain).  Skin: Negative.   Allergic/Immunologic: Negative.   Neurological: Positive for dizziness (occasional ) and headaches (occasional ).    Hematological: Negative.   Psychiatric/Behavioral: Negative.     Objective:    Physical Exam  Constitutional: She is oriented to person, place, and time. She appears well-developed and well-nourished.  HENT:  Head: Normocephalic and atraumatic.  Eyes: Conjunctivae are normal.  Cardiovascular: Normal rate, regular rhythm, normal heart sounds and intact distal pulses.  Pulmonary/Chest: Effort normal and breath sounds normal.  Abdominal: Soft. Bowel sounds are normal.  Musculoskeletal:        General: Normal range of motion.     Cervical back: Normal range of motion and neck supple.  Neurological: She is alert and oriented to person, place, and time. She has normal reflexes.  Skin: Skin is warm and dry.  Psychiatric: She has a normal mood and affect. Her behavior is normal. Judgment and thought content normal.  Nursing note and vitals reviewed.   BP 133/76  Pulse 80   Temp 98 F (36.7 C)   Ht 5' 7"  (1.702 m)   Wt 232 lb (105.2 kg)   LMP 05/12/2013   SpO2 99%   BMI 36.34 kg/m  Wt Readings from Last 3 Encounters:  09/28/19 232 lb (105.2 kg)  04/10/19 223 lb (101.2 kg)  02/19/19 229 lb 6.4 oz (104.1 kg)     Health Maintenance Due  Topic Date Due  . Hepatitis C Screening  Never done  . COVID-19 Vaccine (1) Never done  . TETANUS/TDAP  Never done  . PAP SMEAR-Modifier  Never done  . MAMMOGRAM  Never done    There are no preventive care reminders to display for this patient.  Lab Results  Component Value Date   TSH 1.690 01/20/2019   Lab Results  Component Value Date   WBC 6.5 04/10/2019   HGB 14.1 04/10/2019   HCT 42.3 04/10/2019   MCV 97.0 04/10/2019   PLT 241 04/10/2019   Lab Results  Component Value Date   NA 140 04/10/2019   K 3.4 (L) 04/10/2019   CO2 25 04/10/2019   GLUCOSE 107 (H) 04/10/2019   BUN 12 04/10/2019   CREATININE 0.67 04/10/2019   BILITOT 0.3 01/11/2019   ALKPHOS 95 01/11/2019   AST 18 01/11/2019   ALT 21 01/11/2019   PROT 7.5  01/11/2019   ALBUMIN 4.4 01/11/2019   CALCIUM 8.9 04/10/2019   ANIONGAP 10 04/10/2019   Lab Results  Component Value Date   CHOL 219 (H) 01/20/2019   Lab Results  Component Value Date   HDL 58 01/20/2019   Lab Results  Component Value Date   LDLCALC 123 (H) 01/20/2019   Lab Results  Component Value Date   TRIG 216 (H) 01/20/2019   Lab Results  Component Value Date   CHOLHDL 3.8 01/20/2019   Lab Results  Component Value Date   HGBA1C 5.1 09/28/2019      Assessment & Plan:   1. Pre-diabetes - POCT urinalysis dipstick - POCT glycosylated hemoglobin (Hb A1C) - POCT glucose (manual entry) - metFORMIN (GLUCOPHAGE) 500 MG tablet; Take 1 tablet (500 mg total) by mouth 2 (two) times daily with a meal.  Dispense: 60 tablet; Refill: 11  2. Ulcerative colitis with complication, unspecified location [facility]) She continues to follow up with GI as needed.   3. Bloody diarrhea Stable as of now.   4. Gastroesophageal reflux disease without esophagitis - omeprazole (PRILOSEC) 20 MG capsule; Take 1 capsule (20 mg total) by mouth daily.  Dispense: 30 capsule; Refill: 11  5. Bilateral lower extremity edema - furosemide (LASIX) 20 MG tablet; Take 1 tablet (20 mg total) by mouth daily.  Dispense: 30 tablet; Refill: 11  6. Arthritis - acetaminophen (TYLENOL) 500 MG tablet; Take 1 tablet (500 mg total) by mouth 2 (two) times daily as needed.  Dispense: 60 tablet; Refill: 6  7. Insomnia, unspecified type - traZODone (DESYREL) 100 MG tablet; Take 1 tablet (100 mg total) by mouth at bedtime.  Dispense: 30 tablet; Refill: 6  8. Abnormal urinalysis Results are pending.  - Urine Culture  9. Follow up She will follow up in 6 months.   Meds ordered this encounter  Medications  . metFORMIN (GLUCOPHAGE) 500 MG tablet    Sig: Take 1 tablet (500 mg total) by mouth 2 (two) times daily with a meal.    Dispense:  60 tablet    Refill:  11  . furosemide (LASIX) 20 MG tablet  Sig: Take 1  tablet (20 mg total) by mouth daily.    Dispense:  30 tablet    Refill:  11  . omeprazole (PRILOSEC) 20 MG capsule    Sig: Take 1 capsule (20 mg total) by mouth daily.    Dispense:  30 capsule    Refill:  11  . acetaminophen (TYLENOL) 500 MG tablet    Sig: Take 1 tablet (500 mg total) by mouth 2 (two) times daily as needed.    Dispense:  60 tablet    Refill:  6  . traZODone (DESYREL) 100 MG tablet    Sig: Take 1 tablet (100 mg total) by mouth at bedtime.    Dispense:  30 tablet    Refill:  6    Orders Placed This Encounter  Procedures  . Urine Culture  . POCT urinalysis dipstick  . POCT glycosylated hemoglobin (Hb A1C)  . POCT glucose (manual entry)    Referral Orders  No referral(s) requested today    Kathe Becton,  MSN, FNP-BC [address] [place], [place] [address] [phone] fax     Problem List Items Addressed This Visit      Digestive   Gastroesophageal reflux disease without esophagitis   Relevant Medications   omeprazole (PRILOSEC) 20 MG capsule   Ulcerative colitis with complication ([facility])     Other   Bilateral lower extremity edema   Relevant Medications   furosemide (LASIX) 20 MG tablet   Pre-diabetes - Primary   Relevant Medications   metFORMIN (GLUCOPHAGE) 500 MG tablet   Other Relevant Orders   POCT urinalysis dipstick (Completed)   POCT glycosylated hemoglobin (Hb A1C) (Completed)   POCT glucose (manual entry) (Completed)    Other Visit Diagnoses    Bloody diarrhea       Arthritis       Relevant Medications   acetaminophen (TYLENOL) 500 MG tablet   Insomnia, unspecified type       Relevant Medications   traZODone (DESYREL) 100 MG tablet   Abnormal urinalysis       Relevant Orders   Urine Culture   Follow up          Meds ordered this encounter  Medications  . metFORMIN (GLUCOPHAGE) 500 MG tablet    Sig: Take 1 tablet (500 mg  total) by mouth 2 (two) times daily with a meal.    Dispense:  60 tablet    Refill:  11  . furosemide (LASIX) 20 MG tablet    Sig: Take 1 tablet (20 mg total) by mouth daily.    Dispense:  30 tablet    Refill:  11  . omeprazole (PRILOSEC) 20 MG capsule    Sig: Take 1 capsule (20 mg total) by mouth daily.    Dispense:  30 capsule    Refill:  11  . acetaminophen (TYLENOL) 500 MG tablet    Sig: Take 1 tablet (500 mg total) by mouth 2 (two) times daily as needed.    Dispense:  60 tablet    Refill:  6  . traZODone (DESYREL) 100 MG tablet    Sig: Take 1 tablet (100 mg total) by mouth at bedtime.    Dispense:  30 tablet    Refill:  6    Follow-up: Return in about 6 months (around 03/30/2020).    Azzie Glatter, FNP

## 2019-09-28 NOTE — Patient Instructions (Addendum)
RICE Therapy for Routine Care of Injuries Many injuries can be cared for with rest, ice, compression, and elevation (RICE therapy). This includes:  Resting the injured part.  Putting ice on the injury.  Putting pressure (compression) on the injury.  Raising the injured part (elevation). Using RICE therapy can help to lessen pain and swelling. Supplies needed:  Ice.  Plastic bag.  Towel.  Elastic bandage.  Pillow or pillows to raise (elevate) your injured body part. How to care for your injury with RICE therapy Rest Limit your normal activities, and try not to use the injured part of your body. You can go back to your normal activities when your doctor says it is okay to do them and you feel okay. Ask your doctor if you should do exercises to help your injury get better. Ice Put ice on the injured area. Do not put ice on your bare skin.  Put ice in a plastic bag.  Place a towel between your skin and the bag.  Leave the ice on for 20 minutes, 2-3 times a day. Use ice on as many days as told by your doctor.  Compression Compression means putting pressure on the injured area. This can be done with an elastic bandage. If an elastic bandage has been put on your injury:  Do not wrap the bandage too tight. Wrap the bandage more loosely if part of your body away from the bandage is blue, swollen, cold, painful, or loses feeling (gets numb).  Take off the bandage and put it on again. Do this every 3-4 hours or as told by your doctor.  See your doctor if the bandage seems to make your problems worse.  Elevation Elevation means keeping the injured area raised. If you can, raise the injured area above your heart or the center of your chest. Contact a doctor if:  You keep having pain and swelling.  Your symptoms get worse. Get help right away if:  You have sudden bad pain at your injury or lower than your injury.  You have redness or more swelling around your injury.  You  have tingling or numbness at your injury or lower than your injury, and it does not go away when you take off the bandage. Summary  Many injuries can be cared for using rest, ice, compression, and elevation (RICE therapy).  You can go back to your normal activities when you feel okay and your doctor says it is okay.  Put ice on the injured area as told by your doctor.  Get help if your symptoms get worse or if you keep having pain and swelling. This information is not intended to replace advice given to you by your health care provider. Make sure you discuss any questions you have with your health care provider. Document Revised: 01/10/2017 Document Reviewed: 01/10/2017 Elsevier Patient Education  [place]. Trazodone tablets What is this medicine? TRAZODONE (TRAZ oh done) is used to treat depression. This medicine may be used for other purposes; ask your health care provider or pharmacist if you have questions. COMMON BRAND NAME(S): Desyrel What should I tell my health care provider before I take this medicine? They need to know if you have any of these conditions:  attempted suicide or thinking about it  bipolar disorder  bleeding problems  glaucoma  heart disease, or previous heart attack  irregular heart beat  kidney or liver disease  low levels of sodium in the blood  an unusual or allergic reaction  to trazodone, other medicines, foods, dyes or preservatives  pregnant or trying to get pregnant  breast-feeding How should I use this medicine? Take this medicine by mouth with a glass of water. Follow the directions on the prescription label. Take this medicine shortly after a meal or a light snack. Take your medicine at regular intervals. Do not take your medicine more often than directed. Do not stop taking this medicine suddenly except upon the advice of your doctor. Stopping this medicine too quickly may cause serious side effects or your condition may  worsen. A special MedGuide will be given to you by the pharmacist with each prescription and refill. Be sure to read this information carefully each time. Talk to your pediatrician regarding the use of this medicine in children. Special care may be needed. Overdosage: If you think you have taken too much of this medicine contact a poison control center or emergency room at once. NOTE: This medicine is only for you. Do not share this medicine with others. What if I miss a dose? If you miss a dose, take it as soon as you can. If it is almost time for your next dose, take only that dose. Do not take double or extra doses. What may interact with this medicine? Do not take this medicine with any of the following medications:  certain medicines for fungal infections like fluconazole, itraconazole, ketoconazole, posaconazole, voriconazole  cisapride  dronedarone  linezolid  MAOIs like Carbex, Eldepryl, Marplan, Nardil, and Parnate  mesoridazine  methylene blue (injected into a vein)  pimozide  saquinavir  thioridazine This medicine may also interact with the following medications:  alcohol  antiviral medicines for HIV or AIDS  aspirin and aspirin-like medicines  barbiturates like phenobarbital  certain medicines for blood pressure, heart disease, irregular heart beat  certain medicines for depression, anxiety, or psychotic disturbances  certain medicines for migraine headache like almotriptan, eletriptan, frovatriptan, naratriptan, rizatriptan, sumatriptan, zolmitriptan  certain medicines for seizures like carbamazepine and phenytoin  certain medicines for sleep  certain medicines that treat or prevent blood clots like dalteparin, enoxaparin, warfarin  digoxin  fentanyl  lithium  NSAIDS, medicines for pain and inflammation, like ibuprofen or naproxen  other medicines that prolong the QT interval (cause an abnormal heart rhythm) like  dofetilide  rasagiline  supplements like St. John's wort, kava kava, valerian  tramadol  tryptophan This list may not describe all possible interactions. Give your health care provider a list of all the medicines, herbs, non-prescription drugs, or dietary supplements you use. Also tell them if you smoke, drink alcohol, or use illegal drugs. Some items may interact with your medicine. What should I watch for while using this medicine? Tell your doctor if your symptoms do not get better or if they get worse. Visit your doctor or health care professional for regular checks on your progress. Because it may take several weeks to see the full effects of this medicine, it is important to continue your treatment as prescribed by your doctor. Patients and their families should watch out for new or worsening thoughts of suicide or depression. Also watch out for sudden changes in feelings such as feeling anxious, agitated, panicky, irritable, hostile, aggressive, impulsive, severely restless, overly excited and hyperactive, or not being able to sleep. If this happens, especially at the beginning of treatment or after a change in dose, call your health care professional. Dennis Bast may get drowsy or dizzy. Do not drive, use machinery, or do anything that needs mental alertness  until you know how this medicine affects you. Do not stand or sit up quickly, especially if you are an older patient. This reduces the risk of dizzy or fainting spells. Alcohol may interfere with the effect of this medicine. Avoid alcoholic drinks. This medicine may cause dry eyes and blurred vision. If you wear contact lenses you may feel some discomfort. Lubricating drops may help. See your eye doctor if the problem does not go away or is severe. Your mouth may get dry. Chewing sugarless gum, sucking hard candy and drinking plenty of water may help. Contact your doctor if the problem does not go away or is severe. What side effects may I notice  from receiving this medicine? Side effects that you should report to your doctor or health care professional as soon as possible:  allergic reactions like skin rash, itching or hives, swelling of the face, lips, or tongue  elevated mood, decreased need for sleep, racing thoughts, impulsive behavior  confusion  fast, irregular heartbeat  feeling faint or lightheaded, falls  feeling agitated, angry, or irritable  loss of balance or coordination  painful or prolonged erections  restlessness, pacing, inability to keep still  suicidal thoughts or other mood changes  tremors  trouble sleeping  seizures  unusual bleeding or bruising Side effects that usually do not require medical attention (report to your doctor or health care professional if they continue or are bothersome):  change in sex drive or performance  change in appetite or weight  constipation  headache  muscle aches or pains  nausea This list may not describe all possible side effects. Call your doctor for medical advice about side effects. You may report side effects to FDA at 1-800-FDA-1088. Where should I keep my medicine? Keep out of the reach of children. Store at room temperature between 15 and 30 degrees C (59 to 86 degrees F). Protect from light. Keep container tightly closed. Throw away any unused medicine after the expiration date. NOTE: This sheet is a summary. It may not cover all possible information. If you have questions about this medicine, talk to your doctor, pharmacist, or health care provider.  2020 Elsevier/Gold Standard (2018-04-14 11:46:46)  Insomnia Insomnia is a sleep disorder that makes it difficult to fall asleep or stay asleep. Insomnia can cause fatigue, low energy, difficulty concentrating, mood swings, and poor performance at work or school. There are three different ways to classify insomnia:  Difficulty falling asleep.  Difficulty staying asleep.  Waking up too early in  the morning. Any type of insomnia can be long-term (chronic) or short-term (acute). Both are common. Short-term insomnia usually lasts for three months or less. Chronic insomnia occurs at least three times a week for longer than three months. What are the causes? Insomnia may be caused by another condition, situation, or substance, such as:  Anxiety.  Certain medicines.  Gastroesophageal reflux disease (GERD) or other gastrointestinal conditions.  Asthma or other breathing conditions.  Restless legs syndrome, sleep apnea, or other sleep disorders.  Chronic pain.  Menopause.  Stroke.  Abuse of alcohol, tobacco, or illegal drugs.  Mental health conditions, such as depression.  Caffeine.  Neurological disorders, such as Alzheimer's disease.  An overactive thyroid (hyperthyroidism). Sometimes, the cause of insomnia may not be known. What increases the risk? Risk factors for insomnia include:  Gender. Women are affected more often than men.  Age. Insomnia is more common as you get older.  Stress.  Lack of exercise.  Irregular work schedule or  working night shifts.  Traveling between different time zones.  Certain medical and mental health conditions. What are the signs or symptoms? If you have insomnia, the main symptom is having trouble falling asleep or having trouble staying asleep. This may lead to other symptoms, such as:  Feeling fatigued or having low energy.  Feeling nervous about going to sleep.  Not feeling rested in the morning.  Having trouble concentrating.  Feeling irritable, anxious, or depressed. How is this diagnosed? This condition may be diagnosed based on:  Your symptoms and medical history. Your health care provider may ask about: ? Your sleep habits. ? Any medical conditions you have. ? Your mental health.  A physical exam. How is this treated? Treatment for insomnia depends on the cause. Treatment may focus on treating an  underlying condition that is causing insomnia. Treatment may also include:  Medicines to help you sleep.  Counseling or therapy.  Lifestyle adjustments to help you sleep better. Follow these instructions at home: Eating and drinking   Limit or avoid alcohol, caffeinated beverages, and cigarettes, especially close to bedtime. These can disrupt your sleep.  Do not eat a large meal or eat spicy foods right before bedtime. This can lead to digestive discomfort that can make it hard for you to sleep. Sleep habits   Keep a sleep diary to help you and your health care provider figure out what could be causing your insomnia. Write down: ? When you sleep. ? When you wake up during the night. ? How well you sleep. ? How rested you feel the next day. ? Any side effects of medicines you are taking. ? What you eat and drink.  Make your bedroom a dark, comfortable place where it is easy to fall asleep. ? Put up shades or blackout curtains to block light from outside. ? Use a white noise machine to block noise. ? Keep the temperature cool.  Limit screen use before bedtime. This includes: ? Watching TV. ? Using your smartphone, tablet, or computer.  Stick to a routine that includes going to bed and waking up at the same times every day and night. This can help you fall asleep faster. Consider making a quiet activity, such as reading, part of your nighttime routine.  Try to avoid taking naps during the day so that you sleep better at night.  Get out of bed if you are still awake after 15 minutes of trying to sleep. Keep the lights down, but try reading or doing a quiet activity. When you feel sleepy, go back to bed. General instructions  Take over-the-counter and prescription medicines only as told by your health care provider.  Exercise regularly, as told by your health care provider. Avoid exercise starting several hours before bedtime.  Use relaxation techniques to manage stress. Ask  your health care provider to suggest some techniques that may work well for you. These may include: ? Breathing exercises. ? Routines to release muscle tension. ? Visualizing peaceful scenes.  Make sure that you drive carefully. Avoid driving if you feel very sleepy.  Keep all follow-up visits as told by your health care provider. This is important. Contact a health care provider if:  You are tired throughout the day.  You have trouble in your daily routine due to sleepiness.  You continue to have sleep problems, or your sleep problems get worse. Get help right away if:  You have serious thoughts about hurting yourself or someone else. If you ever feel like  you may hurt yourself or others, or have thoughts about taking your own life, get help right away. You can go to your nearest emergency department or call:  Your local emergency services (911 in the [place]  A suicide crisis helpline, such as the [facility] at 863-397-6968. This is open 24 hours a day. Summary  Insomnia is a sleep disorder that makes it difficult to fall asleep or stay asleep.  Insomnia can be long-term (chronic) or short-term (acute).  Treatment for insomnia depends on the cause. Treatment may focus on treating an underlying condition that is causing insomnia.  Keep a sleep diary to help you and your health care provider figure out what could be causing your insomnia. This information is not intended to replace advice given to you by your health care provider. Make sure you discuss any questions you have with your health care provider. Document Revised: 04/04/2017 Document Reviewed: 01/30/2017 Elsevier Patient Education  [place]. Acetaminophen tablets or caplets What is this medicine? ACETAMINOPHEN (a set a MEE noe fen) is a pain reliever. It is used to treat mild pain and fever. This medicine may be used for other purposes; ask your health care provider or pharmacist if  you have questions. COMMON BRAND NAME(S): Aceta, Actamin, Anacin Aspirin Free, Genapap, Genebs, Mapap, Pain & Fever, Pain and Fever, PAIN RELIEF, PAIN RELIEF Extra Strength, Pain Reliever, Panadol, PHARBETOL, Q-Pap, Q-Pap Extra Strength, Tylenol, Tylenol CrushableTablet, Tylenol Extra Strength, XS No Aspirin, XS Pain Reliever What should I tell my health care provider before I take this medicine? They need to know if you have any of these conditions:  if you often drink alcohol  liver disease  an unusual or allergic reaction to acetaminophen, other medicines, foods, dyes, or preservatives  pregnant or trying to get pregnant  breast-feeding How should I use this medicine? Take this medicine by mouth with a glass of water. Follow the directions on the package or prescription label. Take your medicine at regular intervals. Do not take your medicine more often than directed. Talk to your pediatrician regarding the use of this medicine in children. While this drug may be prescribed for children as young as 55 years of age for selected conditions, precautions do apply. Overdosage: If you think you have taken too much of this medicine contact a poison control center or emergency room at once. NOTE: This medicine is only for you. Do not share this medicine with others. What if I miss a dose? If you miss a dose, take it as soon as you can. If it is almost time for your next dose, take only that dose. Do not take double or extra doses. What may interact with this medicine?  alcohol  imatinib  isoniazid  other medicines with acetaminophen This list may not describe all possible interactions. Give your health care provider a list of all the medicines, herbs, non-prescription drugs, or dietary supplements you use. Also tell them if you smoke, drink alcohol, or use illegal drugs. Some items may interact with your medicine. What should I watch for while using this medicine? Tell your doctor or health  care professional if the pain lasts more than 10 days (5 days for children), if it gets worse, or if there is a new or different kind of pain. Also, check with your doctor if a fever lasts for more than 3 days. Do not take other medicines that contain acetaminophen with this medicine. Always read labels carefully. If you have  questions, ask your doctor or pharmacist. If you take too much acetaminophen get medical help right away. Too much acetaminophen can be very dangerous and cause liver damage. Even if you do not have symptoms, it is important to get help right away. What side effects may I notice from receiving this medicine? Side effects that you should report to your doctor or health care professional as soon as possible:  allergic reactions like skin rash, itching or hives, swelling of the face, lips, or tongue  breathing problems  fever or sore throat  redness, blistering, peeling or loosening of the skin, including inside the mouth  trouble passing urine or change in the amount of urine  unusual bleeding or bruising  unusually weak or tired  yellowing of the eyes or skin Side effects that usually do not require medical attention (report to your doctor or health care professional if they continue or are bothersome):  headache  nausea, stomach upset This list may not describe all possible side effects. Call your doctor for medical advice about side effects. You may report side effects to FDA at 1-800-FDA-1088. Where should I keep my medicine? Keep out of reach of children. Store at room temperature between 20 and 25 degrees C (68 and 77 degrees F). Protect from moisture and heat. Throw away any unused medicine after the expiration date. NOTE: This sheet is a summary. It may not cover all possible information. If you have questions about this medicine, talk to your doctor, pharmacist, or health care provider.  2020 Elsevier/Gold Standard (2012-12-14 12:54:16)

## 2019-09-30 LAB — URINE CULTURE

## 2020-03-29 ENCOUNTER — Ambulatory Visit: Payer: BLUE CROSS/BLUE SHIELD | Admitting: Family Medicine

## 2020-07-25 ENCOUNTER — Emergency Department (HOSPITAL_COMMUNITY)
Admission: EM | Admit: 2020-07-25 | Discharge: 2020-07-25 | Disposition: A | Discharge status: Home / Self Care | Payer: BLUE CROSS/BLUE SHIELD | Attending: Emergency Medicine | Admitting: Emergency Medicine

## 2020-07-25 ENCOUNTER — Emergency Department (HOSPITAL_COMMUNITY): Payer: BLUE CROSS/BLUE SHIELD

## 2020-07-25 ENCOUNTER — Other Ambulatory Visit: Payer: Self-pay

## 2020-07-25 DIAGNOSIS — R7303 Prediabetes: Secondary | ICD-10-CM | POA: Diagnosis not present

## 2020-07-25 DIAGNOSIS — R112 Nausea with vomiting, unspecified: Secondary | ICD-10-CM | POA: Insufficient documentation

## 2020-07-25 DIAGNOSIS — Z8616 Personal history of COVID-19: Secondary | ICD-10-CM | POA: Insufficient documentation

## 2020-07-25 DIAGNOSIS — R109 Unspecified abdominal pain: Secondary | ICD-10-CM

## 2020-07-25 DIAGNOSIS — R1013 Epigastric pain: Secondary | ICD-10-CM | POA: Insufficient documentation

## 2020-07-25 DIAGNOSIS — K219 Gastro-esophageal reflux disease without esophagitis: Secondary | ICD-10-CM | POA: Insufficient documentation

## 2020-07-25 DIAGNOSIS — Z7984 Long term (current) use of oral hypoglycemic drugs: Secondary | ICD-10-CM | POA: Insufficient documentation

## 2020-07-25 LAB — CBC
HCT: 42 % (ref 36.0–46.0)
Hemoglobin: 14.5 g/dL (ref 12.0–15.0)
MCH: 32.4 pg (ref 26.0–34.0)
MCHC: 34.5 g/dL (ref 30.0–36.0)
MCV: 94 fL (ref 80.0–100.0)
Platelets: 262 10*3/uL (ref 150–400)
RBC: 4.47 MIL/uL (ref 3.87–5.11)
RDW: 12.3 % (ref 11.5–15.5)
WBC: 4.8 10*3/uL (ref 4.0–10.5)
nRBC: 0 % (ref 0.0–0.2)

## 2020-07-25 LAB — URINALYSIS, ROUTINE W REFLEX MICROSCOPIC
Bilirubin Urine: NEGATIVE
Glucose, UA: NEGATIVE mg/dL
Hgb urine dipstick: NEGATIVE
Ketones, ur: 5 mg/dL — AB
Nitrite: NEGATIVE
Protein, ur: 30 mg/dL — AB
Specific Gravity, Urine: 1.025 (ref 1.005–1.030)
pH: 5 (ref 5.0–8.0)

## 2020-07-25 LAB — COMPREHENSIVE METABOLIC PANEL
ALT: 27 U/L (ref 0–44)
AST: 25 U/L (ref 15–41)
Albumin: 4 g/dL (ref 3.5–5.0)
Alkaline Phosphatase: 93 U/L (ref 38–126)
Anion gap: 7 (ref 5–15)
BUN: 8 mg/dL (ref 6–20)
CO2: 28 mmol/L (ref 22–32)
Calcium: 9.7 mg/dL (ref 8.9–10.3)
Chloride: 106 mmol/L (ref 98–111)
Creatinine, Ser: 0.72 mg/dL (ref 0.44–1.00)
GFR, Estimated: >60 mL/min (ref >60)
Glucose, Bld: 118 mg/dL — ABNORMAL HIGH (ref 70–99)
Potassium: 3.8 mmol/L (ref 3.5–5.1)
Sodium: 141 mmol/L (ref 135–145)
Total Bilirubin: 0.4 mg/dL (ref 0.3–1.2)
Total Protein: 7.3 g/dL (ref 6.5–8.1)

## 2020-07-25 LAB — TROPONIN I (HIGH SENSITIVITY)
Troponin I (High Sensitivity): 3 ng/L (ref <18)
Troponin I (High Sensitivity): 3 ng/L (ref <18)

## 2020-07-25 LAB — LIPASE, BLOOD: Lipase: 28 U/L (ref 11–51)

## 2020-07-25 MED ORDER — ONDANSETRON HCL 4 MG/2ML IJ SOLN
4.0000 mg | Freq: Once | INTRAMUSCULAR | Status: AC
  Administered 2020-07-25: 4 mg via INTRAVENOUS
  Filled 2020-07-25: qty 2

## 2020-07-25 MED ORDER — LIDOCAINE VISCOUS HCL 2 % MT SOLN
15.0000 mL | Freq: Once | OROMUCOSAL | Status: AC
  Administered 2020-07-25: 15 mL via ORAL
  Filled 2020-07-25: qty 15

## 2020-07-25 MED ORDER — ONDANSETRON HCL 4 MG PO TABS: 4.0000 mg | ORAL_TABLET | Freq: Four times a day (QID) | ORAL | 0 refills | Status: DC

## 2020-07-25 MED ORDER — PANTOPRAZOLE SODIUM 20 MG PO TBEC: 20.0000 mg | DELAYED_RELEASE_TABLET | Freq: Every day | ORAL | 0 refills | Status: AC

## 2020-07-25 MED ORDER — FAMOTIDINE IN NACL 20-0.9 MG/50ML-% IV SOLN
20.0000 mg | Freq: Once | INTRAVENOUS | Status: AC
  Administered 2020-07-25: 20 mg via INTRAVENOUS
  Filled 2020-07-25: qty 50

## 2020-07-25 MED ORDER — SODIUM CHLORIDE 0.9 % IV BOLUS
1000.0000 mL | Freq: Once | INTRAVENOUS | Status: AC
  Administered 2020-07-25: 1000 mL via INTRAVENOUS

## 2020-07-25 MED ORDER — ALUM & MAG HYDROXIDE-SIMETH 200-200-20 MG/5ML PO SUSP
30.0000 mL | Freq: Once | ORAL | Status: AC
  Administered 2020-07-25: 30 mL via ORAL
  Filled 2020-07-25: qty 30

## 2020-07-25 MED ORDER — IOHEXOL 300 MG/ML  SOLN
100.0000 mL | Freq: Once | INTRAMUSCULAR | Status: AC | PRN
  Administered 2020-07-25: 100 mL via INTRAVENOUS

## 2020-07-25 NOTE — ED Provider Notes (Signed)
[facility]PART[facility] Provider Note   CSN: [id number] Arrival date & time: 07/25/20  1237     History Chief Complaint  Patient presents with   Abdominal Pain    Crystal Benton is a 60 y.o. female.  HPI   60 year old female with a history of GERD, arthritis, diarrhea, hyper triglyceridemia, GERD, Covid, ulcerative colitis, who presents to the emergency department today for evaluation of 2-day history of abdominal pain.  Pain located to the epigastrium.  Pain is currently rated at 9/10.  It is constant in nature.  She reports associated nausea and vomiting which is since resolved.  She reports increased flatulence, chills and sweats.  She denies any chest pain, shortness of breath, diarrhea, constipation, bloody stools, fever or urinary symptoms.  Denies any known sick contacts.  Denies eating any suspect foods recently.  States she is not currently on any medications for her ulcerative colitis due to insurance reasons.  She tried taking an oxycodone prior to arrival which improved her symptoms temporarily.  Past Medical History:  Diagnosis Date   Acid reflux 01/2019   Arthritis    Right Knee   Diarrhea    Elevated cholesterol with elevated triglycerides 01/2019   GERD (gastroesophageal reflux disease)    Insomnia    Pneumonia due to 2019 novel coronavirus 04/2019   Ulcerative colitis    Vitamin deficiency 01/2019    Patient Active Problem List   Diagnosis Date Noted   Bilateral lower extremity edema 02/21/2019   Gastroesophageal reflux disease without esophagitis 02/21/2019   Pre-diabetes 02/21/2019   Ulcerative colitis with complication ([facility]) 34/28/7681   Rectal bleeding 01/21/2019   Diarrhea 01/21/2019   Dehydration 05/28/2018   Ulcerative colitis, rectosigmoid, with rectal bleeding ([facility]) 05/26/2018   UNSPECIFIED ANEMIA 01/24/2009   IBD 01/24/2009    Past Surgical History:  Procedure Laterality Date   ABDOMINAL SURGERY      BIOPSY  05/29/2018   Procedure: BIOPSY;  Surgeon: Carol Ada, MD;  Location: [facility];  Service: Endoscopy;;   COLON SURGERY     COLONOSCOPY WITH PROPOFOL N/A 05/29/2018   Procedure: COLONOSCOPY WITH PROPOFOL;  Surgeon: Carol Ada, MD;  Location: [facility];  Service: Endoscopy;  Laterality: N/A;   POLYPECTOMY  05/29/2018   Procedure: POLYPECTOMY;  Surgeon: Carol Ada, MD;  Location: [facility] ENDOSCOPY;  Service: Endoscopy;;     OB History    Gravida  5   Para      Term      Preterm      AB      Living  4     SAB      IAB      Ectopic      Multiple      Live Births              Family History  Problem Relation Age of Onset   Hypertension Mother    Stroke Mother    Seizures Mother    Multiple sclerosis Sister     Social History   Tobacco Use   Smoking status: Never Smoker   Smokeless tobacco: Never Used  Vaping Use   Vaping Use: Never used  Substance Use Topics   Alcohol use: Yes    Comment: occ   Drug use: No    Home Medications Prior to Admission medications   Medication Sig Start Date End Date Taking? Authorizing Provider  ondansetron (ZOFRAN) 4 MG tablet Take 1 tablet (4 mg total) by mouth every  6 (six) hours. 07/25/20  Yes Avelynn Sellin S, PA-C  pantoprazole (PROTONIX) 20 MG tablet Take 1 tablet (20 mg total) by mouth daily for 14 days. 07/25/20 08/08/20 Yes Aluel Schwarz S, PA-C  acetaminophen (TYLENOL) 500 MG tablet Take 1 tablet (500 mg total) by mouth 2 (two) times daily as needed. 09/28/19   Azzie Glatter, FNP  furosemide (LASIX) 20 MG tablet Take 1 tablet (20 mg total) by mouth daily. 09/28/19   Azzie Glatter, FNP  loperamide (IMODIUM A-D) 2 MG tablet Take 1 tablet (2 mg total) by mouth 4 (four) times daily as needed for diarrhea or loose stools. (Up to 8 tablets a day). Patient not taking: Reported on 02/19/2019 01/20/19   Azzie Glatter, FNP  metFORMIN (GLUCOPHAGE) 500 MG tablet Take 1 tablet (500 mg total)  by mouth 2 (two) times daily with a meal. 09/28/19   Azzie Glatter, FNP  traZODone (DESYREL) 100 MG tablet Take 1 tablet (100 mg total) by mouth at bedtime. 09/28/19   Azzie Glatter, FNP  Vitamin D, Ergocalciferol, (DRISDOL) 1.25 MG (50000 UT) CAPS capsule Take 1 capsule (50,000 Units total) by mouth every 7 (seven) days. Patient not taking: Reported on 09/28/2019 01/25/19   Azzie Glatter, FNP    Allergies    Onion, Flagyl [metronidazole], and Metoclopramide hcl  Review of Systems   Review of Systems  Constitutional: Positive for chills and diaphoresis. Negative for fever.  HENT: Negative for ear pain and sore throat.   Eyes: Negative for visual disturbance.  Respiratory: Negative for cough and shortness of breath.   Cardiovascular: Negative for chest pain.  Gastrointestinal: Positive for abdominal pain, nausea and vomiting. Negative for constipation and diarrhea.  Genitourinary: Negative for dysuria and hematuria.  Musculoskeletal: Negative for back pain.  Skin: Negative for rash.  Neurological: Negative for seizures and syncope.  All other systems reviewed and are negative.   Physical Exam Updated Vital Signs BP (!) 155/82    Pulse 82    Temp 98 F (36.7 C)    Resp 17    LMP 05/12/2013    SpO2 98%   Physical Exam Vitals and nursing note reviewed.  Constitutional:      General: She is not in acute distress.    Appearance: She is well-developed.  HENT:     Head: Normocephalic and atraumatic.  Eyes:     Conjunctiva/sclera: Conjunctivae normal.  Cardiovascular:     Rate and Rhythm: Normal rate and regular rhythm.     Heart sounds: Normal heart sounds. No murmur heard.   Pulmonary:     Effort: Pulmonary effort is normal. No respiratory distress.     Breath sounds: Normal breath sounds. No wheezing, rhonchi or rales.  Abdominal:     General: Bowel sounds are normal.     Palpations: Abdomen is soft.     Tenderness: There is abdominal tenderness in the epigastric  area. There is guarding. There is no rebound.  Musculoskeletal:     Cervical back: Neck supple.  Skin:    General: Skin is warm and dry.  Neurological:     Mental Status: She is alert.     ED Results / Procedures / Treatments   Labs (all labs ordered are listed, but only abnormal results are displayed) Labs Reviewed  COMPREHENSIVE METABOLIC PANEL - Abnormal; Notable for the following components:      Result Value   Glucose, Bld 118 (*)    All other components within normal  limits  URINALYSIS, ROUTINE W REFLEX MICROSCOPIC - Abnormal; Notable for the following components:   Color, Urine AMBER (*)    APPearance CLOUDY (*)    Ketones, ur 5 (*)    Protein, ur 30 (*)    Leukocytes,Ua MODERATE (*)    Bacteria, UA MANY (*)    All other components within normal limits  LIPASE, BLOOD  CBC  TROPONIN I (HIGH SENSITIVITY)  TROPONIN I (HIGH SENSITIVITY)    EKG None  Radiology CT ABDOMEN PELVIS W CONTRAST  Result Date: 07/25/2020 CLINICAL DATA:  Mid abdominal pain history of ulcerative colitis EXAM: CT ABDOMEN AND PELVIS WITH CONTRAST TECHNIQUE: Multidetector CT imaging of the abdomen and pelvis was performed using the standard protocol following bolus administration of intravenous contrast. CONTRAST:  137m OMNIPAQUE IOHEXOL 300 MG/ML  SOLN COMPARISON:  Sep 17, 2018 FINDINGS: Lower chest: The visualized heart size within normal limits. No pericardial fluid/thickening. No hiatal hernia. The visualized portions of the lungs are clear. Hepatobiliary: There is diffuse low density seen throughout the liver parenchyma.The main portal vein is patent. No evidence of calcified gallstones, gallbladder wall thickening or biliary dilatation. Pancreas: Unremarkable. No pancreatic ductal dilatation or surrounding inflammatory changes. Spleen: Normal in size without focal abnormality. Adrenals/Urinary Tract: Both adrenal glands appear normal. The kidneys and collecting system appear normal without evidence  of urinary tract calculus or hydronephrosis. Bladder is unremarkable. Stomach/Bowel: The stomach, small bowel, and proximal colon are normal in appearance. At the sigmoid rectal junction there appears to be mild apparent wall thickening with question of mild mesenteric edema. Scattered small presacral lymph nodes are seen. No loculated fluid collections or free air. Vascular/Lymphatic: There are no enlarged mesenteric, retroperitoneal, or pelvic lymph nodes. No significant vascular findings are present. Reproductive: The uterus and adnexa are unremarkable. Other: No evidence of abdominal wall mass or hernia. Musculoskeletal: No acute or significant osseous findings. IMPRESSION: Findings which could be suggestive ulcerative colitis at the sigmoid rectal junction. No surrounding loculated fluid collections or free air Electronically Signed   By: BPrudencio PairM.D.   On: 07/25/2020 22:14    Procedures Procedures   Medications Ordered in ED Medications  ondansetron ([facility] injection 4 mg (4 mg Intravenous Given 07/25/20 2127)  famotidine (PEPCID) IVPB 20 mg premix (0 mg Intravenous Stopped 07/25/20 2203)  alum & mag hydroxide-simeth (MAALOX/MYLANTA) 200-200-20 MG/5ML suspension 30 mL (30 mLs Oral Given 07/25/20 2219)    And  lidocaine (XYLOCAINE) 2 % viscous mouth solution 15 mL (15 mLs Oral Given 07/25/20 2220)  sodium chloride 0.9 % bolus 1,000 mL (1,000 mLs Intravenous New Bag/Given 07/25/20 2126)  iohexol (OMNIPAQUE) 300 MG/ML solution 100 mL (100 mLs Intravenous Contrast Given 07/25/20 2151)    ED Course  I have reviewed the triage vital signs and the nursing notes.  Pertinent labs & imaging results that were available during my care of the patient were reviewed by me and considered in my medical decision making (see chart for details).    MDM Rules/Calculators/A&P                          60year old female with history of ulcerative colitis presents the emergency department today for  evaluation of epigastric abdominal pain nausea and vomiting for the last 2 days.  Reviewed/interpreted labs CBC is without leukocytosis or anemia CMP is unremarkable Lipase is negative Urinalysis appears to be contaminated, does not not have any urinary sxs and I have low suspicion for  UTI. I also reviewed her UA from her urgent care visit [facility] which was negative.  CT abd/pelvis - Findings which could be suggestive ulcerative colitis at the sigmoid rectal junction. No surrounding loculated fluid collections or free air   10:51 PM CONSULT with Dr. Paulita Fujita with GI who does not recommend steroids at this time. He recommends symptomatic tx and to have pt call the office to set up a follow up appointment.   Patient was given IV fluids, Pepcid, GI cocktail and Zofran.  On reassessment she feels some improvement. She has been able to tolerate po. Discussed findings and plan for symptomatic tx. Discussed plan for f/u with gi and strict return precautions. She voices understanding of the plan and reasons to return. All questions answered, pt stable for discharge.   Final Clinical Impression(s) / ED Diagnoses Final diagnoses:  Abdominal pain, unspecified abdominal location  Nausea and vomiting, intractability of vomiting not specified, unspecified vomiting type    Rx / DC Orders ED Discharge Orders         Ordered    ondansetron (ZOFRAN) 4 MG tablet  Every 6 hours        07/25/20 2331    pantoprazole (PROTONIX) 20 MG tablet  Daily        07/25/20 2331           Rodney Booze, PA-C 07/25/20 2331    Lucrezia Starch, MD 07/26/20 2148

## 2020-07-25 NOTE — ED Triage Notes (Signed)
Arrived POV from UC, c/o upper abdominal pain, endorsed history of abdominal abscess years ago.

## 2020-07-25 NOTE — Discharge Instructions (Addendum)
You were given a prescription for zofran to help with your nausea. Please take as directed.  Take protonix as directed as well.   Call Dr. Erlinda Hong office tomorrow to schedule a follow up appointment.   Please return to the ER sooner if you have any new or worsening symptoms, or if you have any of the following symptoms:  Abdominal pain that does not go away.  You have a fever.  You keep throwing up (vomiting).  The pain is felt only in portions of the abdomen. Pain in the right side could possibly be appendicitis. In an adult, pain in the left lower portion of the abdomen could be colitis or diverticulitis.  You pass bloody or black tarry stools.  There is bright red blood in the stool.  The constipation stays for more than 4 days.  There is belly (abdominal) or rectal pain.  You do not seem to be getting better.  You have any questions or concerns.

## 2022-07-24 ENCOUNTER — Inpatient Hospital Stay (HOSPITAL_COMMUNITY)
Admission: EM | Admit: 2022-07-24 | Discharge: 2022-07-28 | DRG: 387 | Disposition: A | Discharge status: Home / Self Care | Payer: BLUE CROSS/BLUE SHIELD | Attending: Internal Medicine | Admitting: Internal Medicine

## 2022-07-24 ENCOUNTER — Emergency Department (HOSPITAL_COMMUNITY): Payer: BLUE CROSS/BLUE SHIELD

## 2022-07-24 ENCOUNTER — Other Ambulatory Visit: Payer: Self-pay

## 2022-07-24 ENCOUNTER — Encounter (HOSPITAL_COMMUNITY): Payer: Self-pay

## 2022-07-24 DIAGNOSIS — Z820 Family history of epilepsy and other diseases of the nervous system: Secondary | ICD-10-CM

## 2022-07-24 DIAGNOSIS — Z87892 Personal history of anaphylaxis: Secondary | ICD-10-CM

## 2022-07-24 DIAGNOSIS — E876 Hypokalemia: Secondary | ICD-10-CM | POA: Diagnosis present

## 2022-07-24 DIAGNOSIS — R8290 Unspecified abnormal findings in urine: Secondary | ICD-10-CM | POA: Diagnosis present

## 2022-07-24 DIAGNOSIS — Z86010 Personal history of colonic polyps: Secondary | ICD-10-CM

## 2022-07-24 DIAGNOSIS — Z7984 Long term (current) use of oral hypoglycemic drugs: Secondary | ICD-10-CM

## 2022-07-24 DIAGNOSIS — Z8701 Personal history of pneumonia (recurrent): Secondary | ICD-10-CM

## 2022-07-24 DIAGNOSIS — Z8616 Personal history of COVID-19: Secondary | ICD-10-CM

## 2022-07-24 DIAGNOSIS — K51311 Ulcerative (chronic) rectosigmoiditis with rectal bleeding: Secondary | ICD-10-CM | POA: Diagnosis not present

## 2022-07-24 DIAGNOSIS — Z91018 Allergy to other foods: Secondary | ICD-10-CM

## 2022-07-24 DIAGNOSIS — Z79899 Other long term (current) drug therapy: Secondary | ICD-10-CM

## 2022-07-24 DIAGNOSIS — E7800 Pure hypercholesterolemia, unspecified: Secondary | ICD-10-CM | POA: Diagnosis present

## 2022-07-24 DIAGNOSIS — K219 Gastro-esophageal reflux disease without esophagitis: Secondary | ICD-10-CM | POA: Diagnosis present

## 2022-07-24 DIAGNOSIS — K51911 Ulcerative colitis, unspecified with rectal bleeding: Secondary | ICD-10-CM | POA: Diagnosis present

## 2022-07-24 DIAGNOSIS — K51211 Ulcerative (chronic) proctitis with rectal bleeding: Secondary | ICD-10-CM | POA: Diagnosis not present

## 2022-07-24 DIAGNOSIS — Z8249 Family history of ischemic heart disease and other diseases of the circulatory system: Secondary | ICD-10-CM

## 2022-07-24 DIAGNOSIS — K648 Other hemorrhoids: Secondary | ICD-10-CM | POA: Diagnosis present

## 2022-07-24 DIAGNOSIS — K5190 Ulcerative colitis, unspecified, without complications: Secondary | ICD-10-CM | POA: Diagnosis not present

## 2022-07-24 DIAGNOSIS — M1990 Unspecified osteoarthritis, unspecified site: Secondary | ICD-10-CM | POA: Diagnosis present

## 2022-07-24 DIAGNOSIS — E781 Pure hyperglyceridemia: Secondary | ICD-10-CM | POA: Diagnosis present

## 2022-07-24 DIAGNOSIS — Z888 Allergy status to other drugs, medicaments and biological substances status: Secondary | ICD-10-CM

## 2022-07-24 DIAGNOSIS — Z823 Family history of stroke: Secondary | ICD-10-CM

## 2022-07-24 LAB — COMPREHENSIVE METABOLIC PANEL
ALT: 16 U/L (ref 0–44)
AST: 17 U/L (ref 15–41)
Albumin: 4 g/dL (ref 3.5–5.0)
Alkaline Phosphatase: 85 U/L (ref 38–126)
Anion gap: 9 (ref 5–15)
BUN: 14 mg/dL (ref 6–20)
CO2: 22 mmol/L (ref 22–32)
Calcium: 9.1 mg/dL (ref 8.9–10.3)
Chloride: 108 mmol/L (ref 98–111)
Creatinine, Ser: 0.86 mg/dL (ref 0.44–1.00)
GFR, Estimated: >60 mL/min (ref >60)
Glucose, Bld: 101 mg/dL — ABNORMAL HIGH (ref 70–99)
Potassium: 3.4 mmol/L — ABNORMAL LOW (ref 3.5–5.1)
Sodium: 139 mmol/L (ref 135–145)
Total Bilirubin: 0.4 mg/dL (ref 0.3–1.2)
Total Protein: 7.1 g/dL (ref 6.5–8.1)

## 2022-07-24 LAB — LIPASE, BLOOD: Lipase: 32 U/L (ref 11–51)

## 2022-07-24 LAB — CBC WITH DIFFERENTIAL/PLATELET
Abs Immature Granulocytes: 0.01 10*3/uL (ref 0.00–0.07)
Basophils Absolute: 0 10*3/uL (ref 0.0–0.1)
Basophils Relative: 0 %
Eosinophils Absolute: 0.1 10*3/uL (ref 0.0–0.5)
Eosinophils Relative: 3 %
HCT: 39.5 % (ref 36.0–46.0)
Hemoglobin: 13.6 g/dL (ref 12.0–15.0)
Immature Granulocytes: 0 %
Lymphocytes Relative: 32 %
Lymphs Abs: 1.6 10*3/uL (ref 0.7–4.0)
MCH: 33.2 pg (ref 26.0–34.0)
MCHC: 34.4 g/dL (ref 30.0–36.0)
MCV: 96.3 fL (ref 80.0–100.0)
Monocytes Absolute: 0.4 10*3/uL (ref 0.1–1.0)
Monocytes Relative: 7 %
Neutro Abs: 2.9 10*3/uL (ref 1.7–7.7)
Neutrophils Relative %: 58 %
Platelets: 260 10*3/uL (ref 150–400)
RBC: 4.1 MIL/uL (ref 3.87–5.11)
RDW: 12.3 % (ref 11.5–15.5)
WBC: 5 10*3/uL (ref 4.0–10.5)
nRBC: 0 % (ref 0.0–0.2)

## 2022-07-24 LAB — URINALYSIS, ROUTINE W REFLEX MICROSCOPIC
Bacteria, UA: NONE SEEN
Bilirubin Urine: NEGATIVE
Glucose, UA: NEGATIVE mg/dL
Hgb urine dipstick: NEGATIVE
Ketones, ur: NEGATIVE mg/dL
Nitrite: NEGATIVE
Protein, ur: NEGATIVE mg/dL
Specific Gravity, Urine: 1.024 (ref 1.005–1.030)
pH: 5 (ref 5.0–8.0)

## 2022-07-24 LAB — POC OCCULT BLOOD, ED: Fecal Occult Bld: POSITIVE — AB

## 2022-07-24 LAB — TYPE AND SCREEN
ABO/RH(D): AB POS
Antibody Screen: NEGATIVE

## 2022-07-24 LAB — PROTIME-INR
INR: 1 (ref 0.8–1.2)
Prothrombin Time: 12.8 seconds (ref 11.4–15.2)

## 2022-07-24 LAB — HEMOGLOBIN AND HEMATOCRIT, BLOOD
HCT: 41.9 % (ref 36.0–46.0)
Hemoglobin: 14.5 g/dL (ref 12.0–15.0)

## 2022-07-24 MED ORDER — MESALAMINE 1000 MG RE SUPP
1000.0000 mg | Freq: Every day | RECTAL | Status: DC
  Administered 2022-07-26 – 2022-07-27 (×2): 1000 mg via RECTAL
  Filled 2022-07-24 (×4): qty 1

## 2022-07-24 MED ORDER — SODIUM CHLORIDE 0.9 % IV SOLN: Freq: Once | INTRAVENOUS | Status: AC

## 2022-07-24 MED ORDER — SODIUM CHLORIDE 0.9% FLUSH
3.0000 mL | Freq: Two times a day (BID) | INTRAVENOUS | Status: DC
  Administered 2022-07-25 – 2022-07-28 (×7): 3 mL via INTRAVENOUS

## 2022-07-24 MED ORDER — PANTOPRAZOLE SODIUM 40 MG IV SOLR
40.0000 mg | INTRAVENOUS | Status: DC
  Administered 2022-07-25 – 2022-07-28 (×4): 40 mg via INTRAVENOUS
  Filled 2022-07-24 (×4): qty 10

## 2022-07-24 MED ORDER — SODIUM CHLORIDE 0.9 % IV SOLN
1.0000 g | INTRAVENOUS | Status: DC
  Administered 2022-07-24 – 2022-07-26 (×3): 1 g via INTRAVENOUS
  Filled 2022-07-24 (×3): qty 10

## 2022-07-24 MED ORDER — PANTOPRAZOLE SODIUM 40 MG IV SOLR
40.0000 mg | Freq: Once | INTRAVENOUS | Status: AC
  Administered 2022-07-24: 40 mg via INTRAVENOUS
  Filled 2022-07-24: qty 10

## 2022-07-24 MED ORDER — ACETAMINOPHEN 325 MG PO TABS
650.0000 mg | ORAL_TABLET | Freq: Four times a day (QID) | ORAL | Status: DC | PRN
  Administered 2022-07-25: 650 mg via ORAL
  Filled 2022-07-24: qty 2

## 2022-07-24 MED ORDER — HYDROMORPHONE HCL 1 MG/ML IJ SOLN
1.0000 mg | Freq: Once | INTRAMUSCULAR | Status: AC
  Administered 2022-07-24: 1 mg via INTRAVENOUS
  Filled 2022-07-24: qty 1

## 2022-07-24 MED ORDER — ONDANSETRON HCL 4 MG PO TABS: 4.0000 mg | ORAL_TABLET | Freq: Four times a day (QID) | ORAL | Status: DC | PRN

## 2022-07-24 MED ORDER — SODIUM CHLORIDE 0.9 % IV SOLN: INTRAVENOUS | Status: AC

## 2022-07-24 MED ORDER — IOHEXOL 350 MG/ML SOLN
100.0000 mL | Freq: Once | INTRAVENOUS | Status: AC | PRN
  Administered 2022-07-24: 100 mL via INTRAVENOUS

## 2022-07-24 MED ORDER — HYDROMORPHONE HCL 1 MG/ML IJ SOLN
0.5000 mg | INTRAMUSCULAR | Status: DC | PRN
  Administered 2022-07-24 – 2022-07-25 (×3): 0.5 mg via INTRAVENOUS
  Filled 2022-07-24 (×3): qty 0.5

## 2022-07-24 MED ORDER — ONDANSETRON HCL 4 MG/2ML IJ SOLN
4.0000 mg | Freq: Once | INTRAMUSCULAR | Status: AC
  Administered 2022-07-24: 4 mg via INTRAVENOUS
  Filled 2022-07-24: qty 2

## 2022-07-24 MED ORDER — ALBUTEROL SULFATE (2.5 MG/3ML) 0.083% IN NEBU: 2.5000 mg | INHALATION_SOLUTION | Freq: Four times a day (QID) | RESPIRATORY_TRACT | Status: DC | PRN

## 2022-07-24 MED ORDER — METHYLPREDNISOLONE SODIUM SUCC 125 MG IJ SOLR
125.0000 mg | Freq: Once | INTRAMUSCULAR | Status: AC
  Administered 2022-07-24: 125 mg via INTRAVENOUS
  Filled 2022-07-24: qty 2

## 2022-07-24 MED ORDER — PREDNISONE 20 MG PO TABS: 40.0000 mg | ORAL_TABLET | Freq: Every day | ORAL | Status: DC

## 2022-07-24 MED ORDER — POTASSIUM CHLORIDE CRYS ER 20 MEQ PO TBCR
40.0000 meq | EXTENDED_RELEASE_TABLET | ORAL | Status: AC
  Administered 2022-07-24: 40 meq via ORAL
  Filled 2022-07-24: qty 2

## 2022-07-24 MED ORDER — ACETAMINOPHEN 650 MG RE SUPP: 650.0000 mg | Freq: Four times a day (QID) | RECTAL | Status: DC | PRN

## 2022-07-24 MED ORDER — HYDROCODONE-ACETAMINOPHEN 5-325 MG PO TABS
1.0000 | ORAL_TABLET | Freq: Four times a day (QID) | ORAL | Status: DC | PRN
  Administered 2022-07-25 – 2022-07-28 (×7): 1 via ORAL
  Filled 2022-07-24 (×7): qty 1

## 2022-07-24 MED ORDER — ONDANSETRON HCL 4 MG/2ML IJ SOLN
4.0000 mg | Freq: Four times a day (QID) | INTRAMUSCULAR | Status: DC | PRN
  Administered 2022-07-25 – 2022-07-26 (×5): 4 mg via INTRAVENOUS
  Filled 2022-07-24 (×5): qty 2

## 2022-07-24 NOTE — ED Provider Notes (Signed)
[facility] Provider Note   CSN: [id number] Arrival date & time: 07/24/22  1003     History  Chief Complaint  Patient presents with   Abdominal Pain    Crystal Benton is a 61 y.o. female.  HPI with a history of GERD, arthritis, diarrhea, hyper triglyceridemia, GERD, Covid, ulcerative colitis patient has history of rectal bleeding with ulcerative colitis.  Prior colonoscopies by Dr. Benson Norway.  Reports she has started getting extensive abdominal pain over the past 2 days.  She is also seeing almost all blood in bowel movements.  Patient reports she is getting cramping pains and sharp pains that are migrating around her upper abdomen but also in her lower abdomen and suprapubic area.  She reports she has not been able to eat much.  She reports she ate half of a hamburger yesterday and very shortly vomited back up.  She reports she saw food she did not really see any blood.  She reports she was able to eat some crackers this morning but immediately went in and had a large bowel movement that was very bloody.  There is been a combination of red blood and also clots.  Denies any fevers.    Home Medications Prior to Admission medications   Medication Sig Start Date End Date Taking? Authorizing Provider  acetaminophen (TYLENOL) 500 MG tablet Take 1 tablet (500 mg total) by mouth 2 (two) times daily as needed. 09/28/19   Azzie Glatter, FNP  furosemide (LASIX) 20 MG tablet Take 1 tablet (20 mg total) by mouth daily. 09/28/19   Azzie Glatter, FNP  loperamide (IMODIUM A-D) 2 MG tablet Take 1 tablet (2 mg total) by mouth 4 (four) times daily as needed for diarrhea or loose stools. (Up to 8 tablets a day). Patient not taking: Reported on 02/19/2019 01/20/19   Azzie Glatter, FNP  metFORMIN (GLUCOPHAGE) 500 MG tablet Take 1 tablet (500 mg total) by mouth 2 (two) times daily with a meal. 09/28/19   Azzie Glatter, FNP  ondansetron (ZOFRAN) 4 MG tablet  Take 1 tablet (4 mg total) by mouth every 6 (six) hours. 07/25/20   Couture, Cortni S, PA-C  pantoprazole (PROTONIX) 20 MG tablet Take 1 tablet (20 mg total) by mouth daily for 14 days. 07/25/20 08/08/20  Couture, Cortni S, PA-C  traZODone (DESYREL) 100 MG tablet Take 1 tablet (100 mg total) by mouth at bedtime. 09/28/19   Azzie Glatter, FNP  Vitamin D, Ergocalciferol, (DRISDOL) 1.25 MG (50000 UT) CAPS capsule Take 1 capsule (50,000 Units total) by mouth every 7 (seven) days. Patient not taking: Reported on 09/28/2019 01/25/19   Azzie Glatter, FNP      Allergies    Onion, Flagyl [metronidazole], and Metoclopramide hcl    Review of Systems   Review of Systems  Physical Exam Updated Vital Signs BP 120/77   Pulse 71   Temp 97.9 F (36.6 C)   Resp 16   Ht 5\' 7"  (1.702 m)   Wt 103 kg   LMP 05/12/2013   SpO2 97%   BMI 35.55 kg/m  Physical Exam Constitutional:      Comments: Alert nontoxic.  Well-nourished clinically well in appearance.  HENT:     Mouth/Throat:     Pharynx: Oropharynx is clear.  Eyes:     Extraocular Movements: Extraocular movements intact.  Cardiovascular:     Rate and Rhythm: Normal rate and regular rhythm.  Pulmonary:  Effort: Pulmonary effort is normal.     Breath sounds: Normal breath sounds.  Abdominal:     Comments: Abdomen soft without guarding the patient has significant reproducible pain particular in the epigastric and central upper abdomen as well as lower abdomen.  Genitourinary:    Comments: No stool in the vault.  No visible blood present.  Patient is tender to palpation with rectal exam. Musculoskeletal:     Comments: Trace edema of the legs with ankle sock compression but no significant edema or calf tenderness.  Skin:    General: Skin is warm and dry.  Neurological:     General: No focal deficit present.     Mental Status: She is oriented to person, place, and time.     Coordination: Coordination normal.  Psychiatric:        Mood and  Affect: Mood normal.     ED Results / Procedures / Treatments   Labs (all labs ordered are listed, but only abnormal results are displayed) Labs Reviewed  COMPREHENSIVE METABOLIC PANEL - Abnormal; Notable for the following components:      Result Value   Potassium 3.4 (*)    Glucose, Bld 101 (*)    All other components within normal limits  URINALYSIS, ROUTINE W REFLEX MICROSCOPIC - Abnormal; Notable for the following components:   APPearance HAZY (*)    Leukocytes,Ua SMALL (*)    All other components within normal limits  POC OCCULT BLOOD, ED - Abnormal; Notable for the following components:   Fecal Occult Bld POSITIVE (*)    All other components within normal limits  LIPASE, BLOOD  CBC WITH DIFFERENTIAL/PLATELET  PROTIME-INR  TYPE AND SCREEN    EKG None  Radiology CT ANGIO GI BLEED  Result Date: 07/24/2022 CLINICAL DATA:  Rectal bleeding since this past Monday. History of ulcerative colitis. EXAM: CTA ABDOMEN AND PELVIS WITHOUT AND WITH CONTRAST TECHNIQUE: Multidetector CT imaging of the abdomen and pelvis was performed using the standard protocol during bolus administration of intravenous contrast. Multiplanar reconstructed images and MIPs were obtained and reviewed to evaluate the vascular anatomy. RADIATION DOSE REDUCTION: This exam was performed according to the departmental dose-optimization program which includes automated exposure control, adjustment of the mA and/or kV according to patient size and/or use of iterative reconstruction technique. CONTRAST:  137mL OMNIPAQUE IOHEXOL 350 MG/ML SOLN COMPARISON:  07/25/2020; 09/17/2018 FINDINGS: VASCULAR Aorta: There is no significant atherosclerotic plaque within the abdominal aorta. Widely patent without a hemodynamically significant narrowing. The abdominal aorta is of normal caliber. No abdominal aortic dissection or perivascular stranding. Celiac: Widely patent without hemodynamically significant narrowing. Conventional  branching pattern. SMA: Widely patent without hemodynamically significant narrowing. Conventional branching pattern. The distal tributaries of the SMA are widely patent without discrete intraluminal filling defect to suggest distal embolism. Renals: Solitary bilaterally; the bilateral renal arteries are of normal caliber and widely patent without hemodynamically significant narrowing. No vessel irregularity to suggest FMD. IMA: Widely patent without hemodynamically significant narrowing. Inflow: The bilateral common, external and internal iliac arteries are of normal caliber and widely patent without hemodynamically significant narrowing. Proximal Outflow: The bilateral common and imaged portions of the bilateral deep and superficial femoral arteries are of normal caliber and widely patent without hemodynamically significant narrowing. Veins: The IVC and pelvic venous systems appear widely patent. Review of the MIP images confirms the above findings. _________________________________________________________ NON-VASCULAR Lower chest: Limited visualization of the lower thorax demonstrates minimal dependent subpleural ground-glass atelectasis. No discrete focal airspace opacities. No pleural effusion.  Normal heart size.  No pericardial effusion. Hepatobiliary: There is mild diffuse decreased attenuation of the hepatic parenchyma on this postcontrast examination suggestive of hepatic steatosis. No discrete hepatic lesions. Normal appearance of the gallbladder given degree of distention. No radiopaque gallstones. No intra or extrahepatic biliary ductal dilatation. No ascites. Pancreas: Normal appearance of the pancreas. Spleen: Normal appearance of the spleen. Adrenals/Urinary Tract: There is symmetric enhancement of the bilateral kidneys. No evidence of nephrolithiasis on this postcontrast examination. No discrete renal lesions. No urine obstruction or perinephric stranding. Normal appearance of the bilateral adrenal  glands. There is mild thickening of the urinary bladder wall, potentially accentuated due to underdistention. Stomach/Bowel: There are no discrete areas of intraluminal contrast extravasation to suggest acute GI bleeding. Suspected circumferential wall thickening involving the distal aspect of the sigmoid colon and rectum. No associated mesenteric stranding. Moderate colonic stool burden without evidence of enteric obstruction. Normal appearance of the terminal ileum and retrocecal appendix. Suspected small hiatal hernia. No pneumoperitoneum, pneumatosis or portal venous gas. Lymphatic: No bulky retroperitoneal, mesenteric, pelvic or inguinal lymphadenopathy. Reproductive: Normal appearance of the pelvic organs. No discrete adnexal lesion. No free fluid in the pelvic cul-de-sac. Other: Tiny mesenteric fat containing periumbilical hernia. Minimal amount of subcutaneous edema about the midline of the low back. Musculoskeletal: No acute or aggressive osseous abnormalities. Mild degenerative change of the bilateral hips with joint space loss, subchondral sclerosis and osteophytosis. IMPRESSION: 1. No discrete areas of intraluminal contrast extravasation to suggest acute GI bleeding. 2. Suspected circumferential wall thickening involving the rectum and distal sigmoid colon, similar to prior examination performed 07/2020, nonspecific though could be seen in the setting of provided history of a localized inflammatory colitis. Clinical correlation is advised. Further evaluation with colonoscopy could be performed as indicated. 3. Moderate colonic stool burden without evidence of enteric obstruction. 4. Small hiatal hernia. 5. Suspected hepatic steatosis. Correlation with LFTs is advised. 6. No significant atherosclerotic plaque within a normal caliber abdominal aorta. Electronically Signed   By: Sandi Mariscal M.D.   On: 07/24/2022 13:33    Procedures Procedures    Medications Ordered in ED Medications  HYDROmorphone  (DILAUDID) injection 1 mg (1 mg Intravenous Given 07/24/22 1112)  ondansetron (ZOFRAN) injection 4 mg (4 mg Intravenous Given 07/24/22 1112)  pantoprazole (PROTONIX) injection 40 mg (40 mg Intravenous Given 07/24/22 1112)  0.9 %  sodium chloride infusion ( Intravenous New Bag/Given 07/24/22 1116)  iohexol (OMNIPAQUE) 350 MG/ML injection 100 mL (100 mLs Intravenous Contrast Given 07/24/22 1316)  methylPREDNISolone sodium succinate (SOLU-MEDROL) 125 mg/2 mL injection 125 mg (125 mg Intravenous Given 07/24/22 1449)  HYDROmorphone (DILAUDID) injection 1 mg (1 mg Intravenous Given 07/24/22 1449)    ED Course/ Medical Decision Making/ A&P                             Medical Decision Making Amount and/or Complexity of Data Reviewed Labs: ordered. Radiology: ordered.  Risk Prescription drug management. Decision regarding hospitalization.   Patient presents with combination of abdominal pain and rectal bleeding.  Patient has a diagnosed history of ulcerative colitis.  She reports this does feel like a flareup.  Patient has not had fever.  She has had 1 episode of vomiting but no hematemesis.  Clinically patient is alert and nontoxic, she is not hypotensive, borderline tachycardia.  Will proceed with diagnostic evaluation including blood work and CT scan.  For pain patient will be given Dilaudid and Zofran.  Patient reports upper abdominal pain as well and GI bleeding will initiate a dose of Protonix although based on history, it seems more likely this is lower GI bleeding.  CT scan interpreted by radiology shows wall thickening of the sigmoid colon and distal rectum.  No aggressive acute bleeding by angiography.  Hemoglobin is at normal range at 13.6.  At this time patient is stable.  She is treated for pain which has been helpful with Dilaudid.  Consult: Reviewed with Dr. Collene Mares.  At this time she recommends initiating Solu-Medrol 125 mg and admitting to hospitalist service.  GI will see the patient in  consult for ongoing management.  Patient is presenting with acute flare of ulcerative colitis with pain and bleeding.  Pain has been treated with Dilaudid.  Solu-Medrol initiated based on recommendations from GI consult.  Plan for admission.  Consult: Dr. Tamala Julian [facility] hospitalist consult for admission.        Final Clinical Impression(s) / ED Diagnoses Final diagnoses:  Ulcerative colitis with rectal bleeding, unspecified location [facility])    Rx / DC Orders ED Discharge Orders     None         Charlesetta Shanks, MD 07/24/22 1541

## 2022-07-24 NOTE — ED Notes (Signed)
Patient transported to CT 

## 2022-07-24 NOTE — ED Triage Notes (Signed)
Pt arrived POV from home c/o upper abdominal pain that started yesterday. Pt states she attempted to eat something and then started having N/V. Pt states she then started having bright red bleeding and blood clots when she wipes. Pt is unsure if it is coming from the rectum or vagina.

## 2022-07-24 NOTE — ED Notes (Signed)
ED TO INPATIENT HANDOFF REPORT  ED Nurse Name and Phone #: Effie Shy Name/Age/Gender Crystal Benton 62 y.o. female Room/Bed: 004C/004C  Code Status   Code Status: Prior  Home/SNF/Other Home Patient oriented to: self, place, time, and situation Is this baseline? Yes   Triage Complete: Triage complete  Chief Complaint Ulcerative colitis ([place]) [K51.90]  Triage Note Pt arrived POV from home c/o upper abdominal pain that started yesterday. Pt states she attempted to eat something and then started having N/V. Pt states she then started having bright red bleeding and blood clots when she wipes. Pt is unsure if it is coming from the rectum or vagina.    Allergies Allergies  Allergen Reactions   Onion Anaphylaxis   Flagyl [Metronidazole] Other (See Comments)    Makes her feel like she's dying   Metoclopramide Hcl Other (See Comments)    cramping    Level of Care/Admitting Diagnosis ED Disposition     ED Disposition  Admit   Condition  --   [place]: [place] [100100]  Level of Care: Telemetry Medical [104]  May place patient in observation at [facility] or [place] if equivalent level of care is available:: No  Covid Evaluation: Asymptomatic - no recent exposure (last 10 days) testing not required  Diagnosis: Ulcerative colitis [facility]) [id number]  Admitting Physician: Norval Morton [id number]  Attending Physician: Norval Morton [id number]          B Medical/Surgery History Past Medical History:  Diagnosis Date   Acid reflux 01/2019   Arthritis    Right Knee   Diarrhea    Elevated cholesterol with elevated triglycerides 01/2019   GERD (gastroesophageal reflux disease)    Insomnia    Pneumonia due to 2019 novel coronavirus 04/2019   Ulcerative colitis    Vitamin deficiency 01/2019   Past Surgical History:  Procedure Laterality Date   ABDOMINAL SURGERY     BIOPSY  05/29/2018   Procedure: BIOPSY;  Surgeon: Carol Ada, MD;  Location: [facility];  Service: Endoscopy;;   COLON SURGERY     COLONOSCOPY WITH PROPOFOL N/A 05/29/2018   Procedure: COLONOSCOPY WITH PROPOFOL;  Surgeon: Carol Ada, MD;  Location: [facility];  Service: Endoscopy;  Laterality: N/A;   POLYPECTOMY  05/29/2018   Procedure: POLYPECTOMY;  Surgeon: Carol Ada, MD;  Location: [facility] ENDOSCOPY;  Service: Endoscopy;;     A IV Location/Drains/Wounds Patient Lines/Drains/Airways Status     Active Line/Drains/Airways     Name Placement date Placement time Site Days   Peripheral IV 07/24/22 20 G Right Antecubital 07/24/22  1100  Antecubital  less than 1            Intake/Output Last 24 hours No intake or output data in the 24 hours ending 07/24/22 1515  Labs/Imaging Results for orders placed or performed during the hospital encounter of 07/24/22 (from the past 48 hour(s))  Comprehensive metabolic panel     Status: Abnormal   Collection Time: 07/24/22 11:00 AM  Result Value Ref Range   Sodium 139 135 - 145 mmol/L   Potassium 3.4 (L) 3.5 - 5.1 mmol/L   Chloride 108 98 - 111 mmol/L   CO2 22 22 - 32 mmol/L   Glucose, Bld 101 (H) 70 - 99 mg/dL    Comment: Glucose reference range applies only to samples taken after fasting for at least 8 hours.   BUN 14 6 - 20 mg/dL   Creatinine, Ser 0.86  0.44 - 1.00 mg/dL   Calcium 9.1 8.9 - 10.3 mg/dL   Total Protein 7.1 6.5 - 8.1 g/dL   Albumin 4.0 3.5 - 5.0 g/dL   AST 17 15 - 41 U/L   ALT 16 0 - 44 U/L   Alkaline Phosphatase 85 38 - 126 U/L   Total Bilirubin 0.4 0.3 - 1.2 mg/dL   GFR, Estimated >60 >60 mL/min    Comment: (NOTE) Calculated using the CKD-EPI Creatinine Equation (2021)    Anion gap 9 5 - 15    Comment: Performed at [facility] [address], [place], [place] [address]  Lipase, blood     Status: None   Collection Time: 07/24/22 11:00 AM  Result Value Ref Range   Lipase 32 11 - 51 U/L    Comment: Performed at [facility] [address]  [place], [place] [address]  CBC with Differential     Status: None   Collection Time: 07/24/22 11:00 AM  Result Value Ref Range   WBC 5.0 4.0 - 10.5 K/uL   RBC 4.10 3.87 - 5.11 MIL/uL   Hemoglobin 13.6 12.0 - 15.0 g/dL   HCT 39.5 36.0 - 46.0 %   MCV 96.3 80.0 - 100.0 fL   MCH 33.2 26.0 - 34.0 pg   MCHC 34.4 30.0 - 36.0 g/dL   RDW 12.3 11.5 - 15.5 %   Platelets 260 150 - 400 K/uL   nRBC 0.0 0.0 - 0.2 %   Neutrophils Relative % 58 %   Neutro Abs 2.9 1.7 - 7.7 K/uL   Lymphocytes Relative 32 %   Lymphs Abs 1.6 0.7 - 4.0 K/uL   Monocytes Relative 7 %   Monocytes Absolute 0.4 0.1 - 1.0 K/uL   Eosinophils Relative 3 %   Eosinophils Absolute 0.1 0.0 - 0.5 K/uL   Basophils Relative 0 %   Basophils Absolute 0.0 0.0 - 0.1 K/uL   Immature Granulocytes 0 %   Abs Immature Granulocytes 0.01 0.00 - 0.07 K/uL    Comment: Performed at [facility], [address], [place], [place] [address]  Protime-INR     Status: None   Collection Time: 07/24/22 11:00 AM  Result Value Ref Range   Prothrombin Time 12.8 11.4 - 15.2 seconds   INR 1.0 0.8 - 1.2    Comment: (NOTE) INR goal varies based on device and disease states. Performed at [facility], [place] [address] [place], [place] [address]   Type and screen [place]     Status: None   Collection Time: 07/24/22 11:00 AM  Result Value Ref Range   ABO/RH(D) AB POS    Antibody Screen NEG    Sample Expiration      07/27/2022,2359 Performed at [facility], [place] [address], [place],  [address]   Urinalysis, Routine w reflex microscopic -Urine, Clean Catch     Status: Abnormal   Collection Time: 07/24/22 11:45 AM  Result Value Ref Range   Color, Urine YELLOW YELLOW   APPearance HAZY (A) CLEAR   Specific Gravity, Urine 1.024 1.005 - 1.030   pH 5.0 5.0 - 8.0   Glucose, UA NEGATIVE NEGATIVE mg/dL   Hgb urine dipstick NEGATIVE NEGATIVE   Bilirubin Urine NEGATIVE NEGATIVE   Ketones, ur NEGATIVE NEGATIVE mg/dL    Protein, ur NEGATIVE NEGATIVE mg/dL   Nitrite NEGATIVE NEGATIVE   Leukocytes,Ua SMALL (A) NEGATIVE   RBC / HPF 0-5 0 -  5 RBC/hpf   WBC, UA 6-10 0 - 5 WBC/hpf   Bacteria, UA NONE SEEN NONE SEEN   Squamous Epithelial / HPF 0-5 0 - 5 /HPF   Mucus PRESENT     Comment: Performed at [facility], [place] [address] [place], [place] [address]  POC occult blood, ED Provider will collect     Status: Abnormal   Collection Time: 07/24/22  2:32 PM  Result Value Ref Range   Fecal Occult Bld POSITIVE (A) NEGATIVE   CT ANGIO GI BLEED  Result Date: 07/24/2022 CLINICAL DATA:  Rectal bleeding since this past Monday. History of ulcerative colitis. EXAM: CTA ABDOMEN AND PELVIS WITHOUT AND WITH CONTRAST TECHNIQUE: Multidetector CT imaging of the abdomen and pelvis was performed using the standard protocol during bolus administration of intravenous contrast. Multiplanar reconstructed images and MIPs were obtained and reviewed to evaluate the vascular anatomy. RADIATION DOSE REDUCTION: This exam was performed according to the departmental dose-optimization program which includes automated exposure control, adjustment of the mA and/or kV according to patient size and/or use of iterative reconstruction technique. CONTRAST:  145mL OMNIPAQUE IOHEXOL 350 MG/ML SOLN COMPARISON:  07/25/2020; 09/17/2018 FINDINGS: VASCULAR Aorta: There is no significant atherosclerotic plaque within the abdominal aorta. Widely patent without a hemodynamically significant narrowing. The abdominal aorta is of normal caliber. No abdominal aortic dissection or perivascular stranding. Celiac: Widely patent without hemodynamically significant narrowing. Conventional branching pattern. SMA: Widely patent without hemodynamically significant narrowing. Conventional branching pattern. The distal tributaries of the SMA are widely patent without discrete intraluminal filling defect to suggest distal embolism. Renals: Solitary bilaterally; the bilateral  renal arteries are of normal caliber and widely patent without hemodynamically significant narrowing. No vessel irregularity to suggest FMD. IMA: Widely patent without hemodynamically significant narrowing. Inflow: The bilateral common, external and internal iliac arteries are of normal caliber and widely patent without hemodynamically significant narrowing. Proximal Outflow: The bilateral common and imaged portions of the bilateral deep and superficial femoral arteries are of normal caliber and widely patent without hemodynamically significant narrowing. Veins: The IVC and pelvic venous systems appear widely patent. Review of the MIP images confirms the above findings. _________________________________________________________ NON-VASCULAR Lower chest: Limited visualization of the lower thorax demonstrates minimal dependent subpleural ground-glass atelectasis. No discrete focal airspace opacities. No pleural effusion. Normal heart size.  No pericardial effusion. Hepatobiliary: There is mild diffuse decreased attenuation of the hepatic parenchyma on this postcontrast examination suggestive of hepatic steatosis. No discrete hepatic lesions. Normal appearance of the gallbladder given degree of distention. No radiopaque gallstones. No intra or extrahepatic biliary ductal dilatation. No ascites. Pancreas: Normal appearance of the pancreas. Spleen: Normal appearance of the spleen. Adrenals/Urinary Tract: There is symmetric enhancement of the bilateral kidneys. No evidence of nephrolithiasis on this postcontrast examination. No discrete renal lesions. No urine obstruction or perinephric stranding. Normal appearance of the bilateral adrenal glands. There is mild thickening of the urinary bladder wall, potentially accentuated due to underdistention. Stomach/Bowel: There are no discrete areas of intraluminal contrast extravasation to suggest acute GI bleeding. Suspected circumferential wall thickening involving the distal  aspect of the sigmoid colon and rectum. No associated mesenteric stranding. Moderate colonic stool burden without evidence of enteric obstruction. Normal appearance of the terminal ileum and retrocecal appendix. Suspected small hiatal hernia. No pneumoperitoneum, pneumatosis or portal venous gas. Lymphatic: No bulky retroperitoneal, mesenteric, pelvic or inguinal lymphadenopathy. Reproductive: Normal appearance of the pelvic organs. No discrete adnexal lesion. No free fluid in the pelvic cul-de-sac. Other: Tiny mesenteric fat  containing periumbilical hernia. Minimal amount of subcutaneous edema about the midline of the low back. Musculoskeletal: No acute or aggressive osseous abnormalities. Mild degenerative change of the bilateral hips with joint space loss, subchondral sclerosis and osteophytosis. IMPRESSION: 1. No discrete areas of intraluminal contrast extravasation to suggest acute GI bleeding. 2. Suspected circumferential wall thickening involving the rectum and distal sigmoid colon, similar to prior examination performed 07/2020, nonspecific though could be seen in the setting of provided history of a localized inflammatory colitis. Clinical correlation is advised. Further evaluation with colonoscopy could be performed as indicated. 3. Moderate colonic stool burden without evidence of enteric obstruction. 4. Small hiatal hernia. 5. Suspected hepatic steatosis. Correlation with LFTs is advised. 6. No significant atherosclerotic plaque within a normal caliber abdominal aorta. Electronically Signed   By: Sandi Mariscal M.D.   On: 07/24/2022 13:33    Pending Labs Unresulted Labs (From admission, onward)    None       Vitals/Pain Today's Vitals   07/24/22 1147 07/24/22 1252 07/24/22 1423 07/24/22 1449  BP:   120/77   Pulse: 68  71   Resp:   16   Temp:   97.9 F (36.6 C)   TempSrc:      SpO2: 98%  97%   Weight:      Height:      PainSc:  7   8     Isolation Precautions No active  isolations  Medications Medications  HYDROmorphone (DILAUDID) injection 1 mg (1 mg Intravenous Given 07/24/22 1112)  ondansetron (ZOFRAN) injection 4 mg (4 mg Intravenous Given 07/24/22 1112)  pantoprazole (PROTONIX) injection 40 mg (40 mg Intravenous Given 07/24/22 1112)  0.9 %  sodium chloride infusion ( Intravenous New Bag/Given 07/24/22 1116)  iohexol (OMNIPAQUE) 350 MG/ML injection 100 mL (100 mLs Intravenous Contrast Given 07/24/22 1316)  methylPREDNISolone sodium succinate (SOLU-MEDROL) 125 mg/2 mL injection 125 mg (125 mg Intravenous Given 07/24/22 1449)  HYDROmorphone (DILAUDID) injection 1 mg (1 mg Intravenous Given 07/24/22 1449)    Mobility walks     Focused Assessments    R Recommendations: See Admitting Provider Note  Report given to:   Additional Notes:

## 2022-07-24 NOTE — H&P (Addendum)
History and Physical    Patient: Crystal Benton [id number] DOB: [birth year] DOA: 07/24/2022 DOS: the patient was seen and examined on 07/24/2022 PCP: Azzie Glatter, FNP  Patient coming from: Home  Chief Complaint:  Chief Complaint  Patient presents with   Abdominal Pain   HPI: Crystal Benton is a 62 y.o. female with medical history significant of ulcerative colitis, arthritis, GERD who presented with complaints of abdominal pain.  m.  She reports chronically having intermittent achy epigastric abdominal pain that has been present for some time.  However, over the last 1-2 days she reported having lower abdominal pains as well with tenderness to palpation.  Yesterday she had eaten a hamburger and reported getting nauseous and vomiting of the burger.  Denies seeing any blood present in the emesis.  Patient also reported that when she was having a bowel movement she had noticed bright red blood present.  She did not like eating much since vomiting yesterday.  She had gone to work this morning and tried to eat some crackers, but immediately thereafter had felt nauseous with lightheadedness and had a large bowel movement with large blood clots present.  Patient denies any fever, loss of consciousness, or shortness of breath.  She notes that she had previously been diagnosed with ulcerative colitis back in 2010.  Since that time she has had intermittent admissions with ulcerative colitis flare last of which was in 2020.  Patient had been seen by Dr. Benson Norway at that time and underwent colonoscopy, but that was right before the pandemic and had not been able to follow-up due to everything shutting down thereafter.  In the emergency department patient was noted to be afebrile with stable vital signs.  Labs significant for hemoglobin 13.6 and potassium 3.4.  CT scan of the abdomen pelvis noted findings suggestive of ulcerative colitis at the sigmoid rectal junction with no fluid collections or free air noted.   Patient has been given Protonix 40 mg IV, Zofran, Dilaudid IV for pain, and normal saline IV fluids.  GI has been consulted and patient has been ordered Solu-Medrol 125 mg IV.   Review of Systems: As mentioned in the history of present illness. All other systems reviewed and are negative. Past Medical History:  Diagnosis Date   Acid reflux 01/2019   Arthritis    Right Knee   Diarrhea    Elevated cholesterol with elevated triglycerides 01/2019   GERD (gastroesophageal reflux disease)    Insomnia    Pneumonia due to 2019 novel coronavirus 04/2019   Ulcerative colitis    Vitamin deficiency 01/2019   Past Surgical History:  Procedure Laterality Date   ABDOMINAL SURGERY     BIOPSY  05/29/2018   Procedure: BIOPSY;  Surgeon: Carol Ada, MD;  Location[facility] ENDOSCOPY;  Service: Endoscopy;;   COLON SURGERY     COLONOSCOPY WITH PROPOFOL N/A 05/29/2018   Procedure: COLONOSCOPY WITH PROPOFOL;  Surgeon: Carol Ada, MD;  Location: [facility];  Service: Endoscopy;  Laterality: N/A;   POLYPECTOMY  05/29/2018   Procedure: POLYPECTOMY;  Surgeon: Carol Ada, MD;  Location: [facility] ENDOSCOPY;  Service: Endoscopy;;   Social History:  reports that she has never smoked. She has never used smokeless tobacco. She reports current alcohol use. She reports that she does not use drugs.  Allergies  Allergen Reactions   Onion Anaphylaxis   Flagyl [Metronidazole] Other (See Comments)    Makes her feel like she's dying   Metoclopramide Hcl Other (See Comments)  cramping    Family History  Problem Relation Age of Onset   Hypertension Mother    Stroke Mother    Seizures Mother    Multiple sclerosis Sister     Prior to Admission medications   Medication Sig Start Date End Date Taking? Authorizing Provider  acetaminophen (TYLENOL) 500 MG tablet Take 1 tablet (500 mg total) by mouth 2 (two) times daily as needed. 09/28/19   Azzie Glatter, FNP  furosemide (LASIX) 20 MG tablet Take 1 tablet (20 mg  total) by mouth daily. 09/28/19   Azzie Glatter, FNP  loperamide (IMODIUM A-D) 2 MG tablet Take 1 tablet (2 mg total) by mouth 4 (four) times daily as needed for diarrhea or loose stools. (Up to 8 tablets a day). Patient not taking: Reported on 02/19/2019 01/20/19   Azzie Glatter, FNP  metFORMIN (GLUCOPHAGE) 500 MG tablet Take 1 tablet (500 mg total) by mouth 2 (two) times daily with a meal. 09/28/19   Azzie Glatter, FNP  ondansetron (ZOFRAN) 4 MG tablet Take 1 tablet (4 mg total) by mouth every 6 (six) hours. 07/25/20   Couture, Cortni S, PA-C  pantoprazole (PROTONIX) 20 MG tablet Take 1 tablet (20 mg total) by mouth daily for 14 days. 07/25/20 08/08/20  Couture, Cortni S, PA-C  traZODone (DESYREL) 100 MG tablet Take 1 tablet (100 mg total) by mouth at bedtime. 09/28/19   Azzie Glatter, FNP  Vitamin D, Ergocalciferol, (DRISDOL) 1.25 MG (50000 UT) CAPS capsule Take 1 capsule (50,000 Units total) by mouth every 7 (seven) days. Patient not taking: Reported on 09/28/2019 01/25/19   Azzie Glatter, FNP    Physical Exam: Vitals:   07/24/22 1025 07/24/22 1100 07/24/22 1147 07/24/22 1423  BP: (!) 146/126 (!) 146/62  120/77  Pulse: 96  68 71  Resp: 18   16  Temp: 98.2 F (36.8 C)   97.9 F (36.6 C)  TempSrc: Oral     SpO2: 98%  98% 97%  Weight: 103 kg     Height: 5\' 7"  (1.702 m)       Constitutional: Adult female currently in no acute distress Eyes: PERRL, lids and conjunctivae normal ENMT: Mucous membranes are moist.   Neck: normal, supple Respiratory: clear to auscultation bilaterally, no wheezing, no crackles. Normal respiratory effort. No accessory muscle use.  Cardiovascular: Regular rate and rhythm, no murmurs / rubs / gallops.   Abdomen: Epigastric and lower abdominal tenderness appreciated.  Bowel sounds positive.  Musculoskeletal: no clubbing / cyanosis. No joint deformity upper and lower extremities. Normal muscle tone.  Skin: no rashes, lesions, ulcers.   Neurologic: CN  2-12 grossly intact. Strength 5/5 in all 4.  Psychiatric: Normal judgment and insight. Alert and oriented x 3. Normal mood.   Data Reviewed:  Reviewed labs, imaging, and pertinent records as noted above in HPI.  Assessment and Plan: Ulcerative colitis with rectal bleeding Patient presents with complaints of abdominal pain and rectal bleeding.  CT scan of the abdomen pelvis significant for ulcerative colitis at the sigmoid rectal junction with no fluid collections or free air notedHemoglobin noted to be stable at 13.6.  Patient has been given Solu-Medrol 125 mg IV.She has a known history of ulcerative colitis, but is not on any medications for treatment at baseline.  Previously, recommended to be on mesalamine suppositories nightly during last hospitalization for ulcerative colitis flare back in 2020. -Admit to a telemetry bed -Clear liquid diet -Serial monitoring of H&H -Hydrocodone/Dilaudid IV as  needed for moderate to severe pain respectively -Antiemetics as needed -mesalamine suppository -Appreciate GI consultative services, will follow-up for further recommendations  Abnormal urinalysis Acute.  Patient had reported complaints of lower abdominal pain.  Urinalysis significant for small leukocytes with 6-10 WBCs. -Check urine culture -Empiric antibiotics of Rocephin given due to patient being on IV steroids  Hypokalemia Acute.  Initial potassium noted to be 3.4 -Give potassium chloride 40 mEq p.o. -Continue to monitor and replace as needed    GI prophylaxis: Protonix IV DVT prophylaxis: SCDs Advance Care Planning:   Code Status: Full Code   Consults: Gastroenterology  Family Communication: None requested at this time  Severity of Illness: The appropriate patient status for this patient is OBSERVATION. Observation status is judged to be reasonable and necessary in order to provide the required intensity of service to ensure the patient's safety. The patient's presenting  symptoms, physical exam findings, and initial radiographic and laboratory data in the context of their medical condition is felt to place them at decreased risk for further clinical deterioration. Furthermore, it is anticipated that the patient will be medically stable for discharge from the hospital within 2 midnights of admission.   Author: Norval Morton, MD 07/24/2022 4:33 PM  For on call review [email or web address].

## 2022-07-25 DIAGNOSIS — Z91018 Allergy to other foods: Secondary | ICD-10-CM | POA: Diagnosis not present

## 2022-07-25 DIAGNOSIS — K5190 Ulcerative colitis, unspecified, without complications: Secondary | ICD-10-CM | POA: Diagnosis present

## 2022-07-25 DIAGNOSIS — Z888 Allergy status to other drugs, medicaments and biological substances status: Secondary | ICD-10-CM | POA: Diagnosis not present

## 2022-07-25 DIAGNOSIS — K51211 Ulcerative (chronic) proctitis with rectal bleeding: Secondary | ICD-10-CM | POA: Diagnosis present

## 2022-07-25 DIAGNOSIS — R8290 Unspecified abnormal findings in urine: Secondary | ICD-10-CM | POA: Diagnosis present

## 2022-07-25 DIAGNOSIS — K51911 Ulcerative colitis, unspecified with rectal bleeding: Secondary | ICD-10-CM | POA: Diagnosis not present

## 2022-07-25 DIAGNOSIS — Z7984 Long term (current) use of oral hypoglycemic drugs: Secondary | ICD-10-CM | POA: Diagnosis not present

## 2022-07-25 DIAGNOSIS — Z8701 Personal history of pneumonia (recurrent): Secondary | ICD-10-CM | POA: Diagnosis not present

## 2022-07-25 DIAGNOSIS — K648 Other hemorrhoids: Secondary | ICD-10-CM | POA: Diagnosis present

## 2022-07-25 DIAGNOSIS — K219 Gastro-esophageal reflux disease without esophagitis: Secondary | ICD-10-CM | POA: Diagnosis present

## 2022-07-25 DIAGNOSIS — Z86010 Personal history of colonic polyps: Secondary | ICD-10-CM | POA: Diagnosis not present

## 2022-07-25 DIAGNOSIS — Z823 Family history of stroke: Secondary | ICD-10-CM | POA: Diagnosis not present

## 2022-07-25 DIAGNOSIS — Z8249 Family history of ischemic heart disease and other diseases of the circulatory system: Secondary | ICD-10-CM | POA: Diagnosis not present

## 2022-07-25 DIAGNOSIS — Z87892 Personal history of anaphylaxis: Secondary | ICD-10-CM | POA: Diagnosis not present

## 2022-07-25 DIAGNOSIS — Z79899 Other long term (current) drug therapy: Secondary | ICD-10-CM | POA: Diagnosis not present

## 2022-07-25 DIAGNOSIS — Z820 Family history of epilepsy and other diseases of the nervous system: Secondary | ICD-10-CM | POA: Diagnosis not present

## 2022-07-25 DIAGNOSIS — M1990 Unspecified osteoarthritis, unspecified site: Secondary | ICD-10-CM | POA: Diagnosis present

## 2022-07-25 DIAGNOSIS — E781 Pure hyperglyceridemia: Secondary | ICD-10-CM | POA: Diagnosis present

## 2022-07-25 DIAGNOSIS — Z8616 Personal history of COVID-19: Secondary | ICD-10-CM | POA: Diagnosis not present

## 2022-07-25 DIAGNOSIS — E876 Hypokalemia: Secondary | ICD-10-CM | POA: Diagnosis present

## 2022-07-25 DIAGNOSIS — E7800 Pure hypercholesterolemia, unspecified: Secondary | ICD-10-CM | POA: Diagnosis present

## 2022-07-25 DIAGNOSIS — K51311 Ulcerative (chronic) rectosigmoiditis with rectal bleeding: Secondary | ICD-10-CM | POA: Diagnosis not present

## 2022-07-25 LAB — BASIC METABOLIC PANEL
Anion gap: 7 (ref 5–15)
BUN: 8 mg/dL (ref 6–20)
CO2: 22 mmol/L (ref 22–32)
Calcium: 8.8 mg/dL — ABNORMAL LOW (ref 8.9–10.3)
Chloride: 107 mmol/L (ref 98–111)
Creatinine, Ser: 0.65 mg/dL (ref 0.44–1.00)
GFR, Estimated: >60 mL/min (ref >60)
Glucose, Bld: 133 mg/dL — ABNORMAL HIGH (ref 70–99)
Potassium: 4.1 mmol/L (ref 3.5–5.1)
Sodium: 136 mmol/L (ref 135–145)

## 2022-07-25 LAB — CBC
HCT: 38 % (ref 36.0–46.0)
Hemoglobin: 13.1 g/dL (ref 12.0–15.0)
MCH: 32.2 pg (ref 26.0–34.0)
MCHC: 34.5 g/dL (ref 30.0–36.0)
MCV: 93.4 fL (ref 80.0–100.0)
Platelets: 293 10*3/uL (ref 150–400)
RBC: 4.07 MIL/uL (ref 3.87–5.11)
RDW: 12.3 % (ref 11.5–15.5)
WBC: 6.8 10*3/uL (ref 4.0–10.5)
nRBC: 0 % (ref 0.0–0.2)

## 2022-07-25 LAB — SEDIMENTATION RATE: Sed Rate: 17 mm/hr (ref 0–22)

## 2022-07-25 LAB — HIV ANTIBODY (ROUTINE TESTING W REFLEX): HIV Screen 4th Generation wRfx: NONREACTIVE

## 2022-07-25 LAB — HEPATITIS B SURFACE ANTIGEN: Hepatitis B Surface Ag: NONREACTIVE

## 2022-07-25 MED ORDER — SODIUM CHLORIDE 0.9 % IV BOLUS
1000.0000 mL | Freq: Once | INTRAVENOUS | Status: AC
  Administered 2022-07-25: 1000 mL via INTRAVENOUS

## 2022-07-25 MED ORDER — PEG 3350-KCL-NA BICARB-NACL 420 G PO SOLR
4000.0000 mL | Freq: Once | ORAL | Status: AC
  Administered 2022-07-25: 4000 mL via ORAL
  Filled 2022-07-25: qty 4000

## 2022-07-25 MED ORDER — METHYLPREDNISOLONE SODIUM SUCC 125 MG IJ SOLR
80.0000 mg | INTRAMUSCULAR | Status: DC
  Administered 2022-07-25 – 2022-07-26 (×2): 80 mg via INTRAVENOUS
  Filled 2022-07-25 (×2): qty 2

## 2022-07-25 NOTE — TOC CM/SW Note (Signed)
  Transition of Care [facility]) Screening Note   Patient Details  Name: Crystal Benton Date of Birth: February 20, 1961   Transition of Care Department [facility]) has reviewed patient and no TOC needs have been identified at this time. We will continue to monitor patient advancement through interdisciplinary progression rounds. If new patient transition needs arise, please place a TOC consult.

## 2022-07-25 NOTE — H&P (View-Only) (Signed)
[facility] Gastroenterology Consult  Referring Provider: Dr. Melburn Popper hospitalist Primary Care Physician:  Azzie Glatter, FNP Primary Gastroenterologist: Technically unassigned(last seen Dr. Paulita Fujita in 2014, saw Dr. Benson Norway inpatient in 2020, no outpatient follow-up since then)  Reason for Consultation: Rectal bleeding  HPI: Crystal Benton is a 62 y.o. 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  Patient had a flexible sigmoidoscopy in 2010 with Dr. Fuller Plan: 6 mm inflammatory sigmoid polyp was removed, chronic active colitis noted in the rectum and sigmoid compatible with ulcerative colitis. Colonoscopy 06/05/2012, Dr. Paulita Fujita: Mildly active chronic colitis of left colon Colonoscopy 05/25/2018, Dr. Benson Norway: 3 mm cecal polyp removed, inflammation noted from anus to rectum up to 10 cm, biopsy showed mildly active chronic colitis recommended Canasa 1000 mg every night .  Patient states that because of COVID she did not follow-up with Dr. Blair Dolphin and has not received any GI care since discharge from the hospital in 2020.  She has noted intermittent rectal bleeding, diarrhea, generalized abdominal pain however symptoms have acutely worsened in the last several days. She complains of burning epigastric area. Denies nausea, vomiting, acid reflux, heartburn difficulty swallowing or pain on swallowing. No family history of inflammatory bowel disease or colon cancer.   Past Medical History:  Diagnosis Date   Acid reflux 01/2019   Arthritis    Right Knee   Diarrhea    Elevated cholesterol with elevated triglycerides 01/2019   GERD (gastroesophageal reflux disease)    Insomnia    Pneumonia due to 2019 novel coronavirus 04/2019   Ulcerative colitis    Vitamin deficiency 01/2019    Past Surgical History:  Procedure Laterality Date   ABDOMINAL SURGERY     BIOPSY  05/29/2018    Procedure: BIOPSY;  Surgeon: Carol Ada, MD;  Location: [facility] ENDOSCOPY;  Service: Endoscopy;;   COLON SURGERY     COLONOSCOPY WITH PROPOFOL N/A 05/29/2018   Procedure: COLONOSCOPY WITH PROPOFOL;  Surgeon: Carol Ada, MD;  Location: [facility];  Service: Endoscopy;  Laterality: N/A;   POLYPECTOMY  05/29/2018   Procedure: POLYPECTOMY;  Surgeon: Carol Ada, MD;  Location: [place] [facility];  Service: Endoscopy;;    Prior to Admission medications   Medication Sig Start Date End Date Taking? Authorizing Provider  Ibuprofen-diphenhydrAMINE Cit 200-38 MG TABS Take 1 tablet by mouth at bedtime as needed (For sleep, pain).   Yes [provider]  ondansetron (ZOFRAN) 4 MG tablet Take 1 tablet (4 mg total) by mouth every 6 (six) hours. Patient taking differently: Take 4 mg by mouth every 6 (six) hours as needed for nausea or vomiting. 07/25/20  Yes Couture, Cortni S, PA-C  acetaminophen (TYLENOL) 500 MG tablet Take 1 tablet (500 mg total) by mouth 2 (two) times daily as needed. Patient not taking: Reported on 07/24/2022 09/28/19   Azzie Glatter, FNP  furosemide (LASIX) 20 MG tablet Take 1 tablet (20 mg total) by mouth daily. Patient not taking: Reported on 07/24/2022 09/28/19   Azzie Glatter, FNP  loperamide (IMODIUM A-D) 2 MG tablet Take 1 tablet (2 mg total) by mouth 4 (four) times daily as needed for diarrhea or loose stools. (Up to 8 tablets a day). Patient not taking: Reported on 07/24/2022 01/20/19   Azzie Glatter, FNP  metFORMIN (GLUCOPHAGE) 500 MG tablet Take 1 tablet (500 mg total) by mouth 2 (two) times daily with a meal. Patient not taking: Reported on  07/24/2022 09/28/19   Azzie Glatter, FNP  pantoprazole (PROTONIX) 20 MG tablet Take 1 tablet (20 mg total) by mouth daily for 14 days. Patient not taking: Reported on 07/24/2022 07/25/20 08/08/20  Couture, Cortni S, PA-C  traZODone (DESYREL) 100 MG tablet Take 1 tablet (100 mg total) by mouth at bedtime. Patient not taking:  Reported on 07/24/2022 09/28/19   Azzie Glatter, FNP  Vitamin D, Ergocalciferol, (DRISDOL) 1.25 MG (50000 UT) CAPS capsule Take 1 capsule (50,000 Units total) by mouth every 7 (seven) days. Patient not taking: Reported on 09/28/2019 01/25/19   Azzie Glatter, FNP    Current Facility-Administered Medications  Medication Dose Route Frequency Provider Last Rate Last Admin   acetaminophen (TYLENOL) tablet 650 mg  650 mg Oral Q6H PRN Norval Morton, MD       Or   acetaminophen (TYLENOL) suppository 650 mg  650 mg Rectal Q6H PRN Fuller Plan A, MD       albuterol (PROVENTIL) (2.5 MG/3ML) 0.083% nebulizer solution 2.5 mg  2.5 mg Nebulization Q6H PRN Tamala Julian, Rondell A, MD       cefTRIAXone (ROCEPHIN) 1 g in sodium chloride 0.9 % 100 mL IVPB  1 g Intravenous Q24H Smith, Rondell A, MD 200 mL/hr at 07/24/22 2153 1 g at 07/24/22 2153   HYDROcodone-acetaminophen (NORCO/VICODIN) 5-325 MG per tablet 1 tablet  1 tablet Oral Q6H PRN Norval Morton, MD   1 tablet at 07/25/22 0926   mesalamine (CANASA) suppository 1,000 mg  1,000 mg Rectal QHS Smith, Rondell A, MD       methylPREDNISolone sodium succinate (SOLU-MEDROL) 125 mg/2 mL injection 80 mg  80 mg Intravenous Q24H Charlynne Cousins, MD   80 mg at 07/25/22 0924   ondansetron (ZOFRAN) tablet 4 mg  4 mg Oral Q6H PRN Norval Morton, MD       Or   ondansetron (ZOFRAN) injection 4 mg  4 mg Intravenous Q6H PRN Fuller Plan A, MD   4 mg at 07/25/22 0920   pantoprazole (PROTONIX) injection 40 mg  40 mg Intravenous Q24H Smith, Rondell A, MD   40 mg at 07/25/22 [id number]   polyethylene glycol-electrolytes (NuLYTELY) solution 4,000 mL  4,000 mL Oral Once Ronnette Juniper, MD       sodium chloride flush (NS) 0.9 % injection 3 mL  3 mL Intravenous Q12H Smith, Rondell A, MD   3 mL at 07/25/22 0930    Allergies as of 07/24/2022 - Review Complete 07/24/2022  Allergen Reaction Noted   Onion Anaphylaxis 07/03/2015   Flagyl [metronidazole] Other (See Comments)  02/20/2012   Metoclopramide hcl Other (See Comments) 01/24/2009    Family History  Problem Relation Age of Onset   Hypertension Mother    Stroke Mother    Seizures Mother    Multiple sclerosis Sister     Social History   Socioeconomic History   Marital status: Divorced    Spouse name: Not on file   Number of children: Not on file   Years of education: Not on file   Highest education level: Not on file  Occupational History   Not on file  Tobacco Use   Smoking status: Never   Smokeless tobacco: Never  Vaping Use   Vaping Use: Never used  Substance and Sexual Activity   Alcohol use: Yes    Comment: occ   Drug use: No   Sexual activity: Yes  Other Topics Concern   Not on file  Social History Narrative  Not on file   Social Determinants of Health   Financial Resource Strain: Not on file  Food Insecurity: Not on file  Transportation Needs: Not on file  Physical Activity: Not on file  Stress: Not on file  Social Connections: Not on file  Intimate Partner Violence: Not on file    Review of Systems: As per HPI Physical Exam: Vital signs in last 24 hours: Temp:  [97.5 F (36.4 C)-98.3 F (36.8 C)] 97.9 F (36.6 C) (03/21 0411) Pulse Rate:  [63-95] 64 (03/21 0411) Resp:  [16-18] 18 (03/20 1759) BP: (120-157)/(59-87) 125/80 (03/21 0411) SpO2:  [97 %-100 %] 100 % (03/21 0411)    General:   Alert,  Well-developed, overweight, pleasant and cooperative in NAD Head:  Normocephalic and atraumatic. Eyes:  Sclera clear, no icterus.   Conjunctiva pink. Ears:  Normal auditory acuity. Nose:  No deformity, discharge,  or lesions. Mouth:  No deformity or lesions.  Oropharynx pink & moist. Neck:  Supple; no masses or thyromegaly. Lungs:  Clear throughout to auscultation.   No wheezes, crackles, or rhonchi. No acute distress. Heart:  Regular rate and rhythm; no murmurs, clicks, rubs,  or gallops. Extremities:  Without clubbing or edema. Neurologic:  Alert and  oriented  x4;  grossly normal neurologically. Skin:  Intact without significant lesions or rashes. Psych:  Alert and cooperative. Normal mood and affect. Abdomen:  Soft, nontender and nondistended. No masses, hepatosplenomegaly or hernias noted. Normal bowel sounds, without guarding, and without rebound.         Lab Results: Recent Labs    07/24/22 1100 07/24/22 1859 07/25/22 0331  WBC 5.0  --  6.8  HGB 13.6 14.5 13.1  HCT 39.5 41.9 38.0  PLT 260  --  293   BMET Recent Labs    07/24/22 1100 07/25/22 0331  NA 139 136  K 3.4* 4.1  CL 108 107  CO2 22 22  GLUCOSE 101* 133*  BUN 14 8  CREATININE 0.86 0.65  CALCIUM 9.1 8.8*   LFT Recent Labs    07/24/22 1100  PROT 7.1  ALBUMIN 4.0  AST 17  ALT 16  ALKPHOS 85  BILITOT 0.4   PT/INR Recent Labs    07/24/22 1100  LABPROT 12.8  INR 1.0    Studies/Results: CT ANGIO GI BLEED  Result Date: 07/24/2022 CLINICAL DATA:  Rectal bleeding since this past Monday. History of ulcerative colitis. EXAM: CTA ABDOMEN AND PELVIS WITHOUT AND WITH CONTRAST TECHNIQUE: Multidetector CT imaging of the abdomen and pelvis was performed using the standard protocol during bolus administration of intravenous contrast. Multiplanar reconstructed images and MIPs were obtained and reviewed to evaluate the vascular anatomy. RADIATION DOSE REDUCTION: This exam was performed according to the departmental dose-optimization program which includes automated exposure control, adjustment of the mA and/or kV according to patient size and/or use of iterative reconstruction technique. CONTRAST:  130mL OMNIPAQUE IOHEXOL 350 MG/ML SOLN COMPARISON:  07/25/2020; 09/17/2018 FINDINGS: VASCULAR Aorta: There is no significant atherosclerotic plaque within the abdominal aorta. Widely patent without a hemodynamically significant narrowing. The abdominal aorta is of normal caliber. No abdominal aortic dissection or perivascular stranding. Celiac: Widely patent without hemodynamically  significant narrowing. Conventional branching pattern. SMA: Widely patent without hemodynamically significant narrowing. Conventional branching pattern. The distal tributaries of the SMA are widely patent without discrete intraluminal filling defect to suggest distal embolism. Renals: Solitary bilaterally; the bilateral renal arteries are of normal caliber and widely patent without hemodynamically significant narrowing. No vessel irregularity to  suggest FMD. IMA: Widely patent without hemodynamically significant narrowing. Inflow: The bilateral common, external and internal iliac arteries are of normal caliber and widely patent without hemodynamically significant narrowing. Proximal Outflow: The bilateral common and imaged portions of the bilateral deep and superficial femoral arteries are of normal caliber and widely patent without hemodynamically significant narrowing. Veins: The IVC and pelvic venous systems appear widely patent. Review of the MIP images confirms the above findings. _________________________________________________________ NON-VASCULAR Lower chest: Limited visualization of the lower thorax demonstrates minimal dependent subpleural ground-glass atelectasis. No discrete focal airspace opacities. No pleural effusion. Normal heart size.  No pericardial effusion. Hepatobiliary: There is mild diffuse decreased attenuation of the hepatic parenchyma on this postcontrast examination suggestive of hepatic steatosis. No discrete hepatic lesions. Normal appearance of the gallbladder given degree of distention. No radiopaque gallstones. No intra or extrahepatic biliary ductal dilatation. No ascites. Pancreas: Normal appearance of the pancreas. Spleen: Normal appearance of the spleen. Adrenals/Urinary Tract: There is symmetric enhancement of the bilateral kidneys. No evidence of nephrolithiasis on this postcontrast examination. No discrete renal lesions. No urine obstruction or perinephric stranding. Normal  appearance of the bilateral adrenal glands. There is mild thickening of the urinary bladder wall, potentially accentuated due to underdistention. Stomach/Bowel: There are no discrete areas of intraluminal contrast extravasation to suggest acute GI bleeding. Suspected circumferential wall thickening involving the distal aspect of the sigmoid colon and rectum. No associated mesenteric stranding. Moderate colonic stool burden without evidence of enteric obstruction. Normal appearance of the terminal ileum and retrocecal appendix. Suspected small hiatal hernia. No pneumoperitoneum, pneumatosis or portal venous gas. Lymphatic: No bulky retroperitoneal, mesenteric, pelvic or inguinal lymphadenopathy. Reproductive: Normal appearance of the pelvic organs. No discrete adnexal lesion. No free fluid in the pelvic cul-de-sac. Other: Tiny mesenteric fat containing periumbilical hernia. Minimal amount of subcutaneous edema about the midline of the low back. Musculoskeletal: No acute or aggressive osseous abnormalities. Mild degenerative change of the bilateral hips with joint space loss, subchondral sclerosis and osteophytosis. IMPRESSION: 1. No discrete areas of intraluminal contrast extravasation to suggest acute GI bleeding. 2. Suspected circumferential wall thickening involving the rectum and distal sigmoid colon, similar to prior examination performed 07/2020, nonspecific though could be seen in the setting of provided history of a localized inflammatory colitis. Clinical correlation is advised. Further evaluation with colonoscopy could be performed as indicated. 3. Moderate colonic stool burden without evidence of enteric obstruction. 4. Small hiatal hernia. 5. Suspected hepatic steatosis. Correlation with LFTs is advised. 6. No significant atherosclerotic plaque within a normal caliber abdominal aorta. Electronically Signed   By: Sandi Mariscal M.D.   On: 07/24/2022 13:33    Impression: Rectal bleeding, bloody diarrhea,  history of ulcerative proctosigmoiditis  Hemoglobin 13.1 and stable, stable hemodynamics CAT scan shows circumferential wall thickening involving the rectum and distal sigmoid loop colitis inflammatory colitis, further evaluation with colonoscopy is indicated   Hepatic steatosis Small hiatal hernia  Plan: Diagnostic colonoscopy tomorrow with Dr. Alessandra Bevels. Patient will be started on clear liquid diet and given colonic prep today. The risks and the benefits of the procedure were discussed with the patient in details. She understands and verbalizes consent.  I had a detailed discussion with the patient that she has a chronic condition, ulcerative proctosigmoiditis versus ulcerative colitis and will need to be on medication for achieving remission and for maintenance. Discussed about importance of taking medications, compliance with outpatient follow-ups and periodic colonoscopies to assess disease severity. Will send ESR, CRP, fecal calprotectin HBsAg and TB  Gold testing. Patient has been appropriately started on IV Solu-Medrol 80 mg every 24 hours and Canasa suppository 1000 mg rectal at bedtime.   LOS: 0 days   Ronnette Juniper, MD  07/25/2022, 10:32 AM

## 2022-07-25 NOTE — Progress Notes (Signed)
[facility] PROGRESS NOTE    Progress Note  Crystal Benton  [id number] DOB: 10-28-1960 DOA: 07/24/2022 PCP: Azzie Glatter, FNP     Brief Narrative:   Crystal Benton is an 62 y.o. female past medical history significant for ulcerative colitis diagnosed back in 2010, GERD comes in complaining of abdominal pain, accompanied by nausea and vomiting and an episode of large bowel movements with large blood clots, no hematemesis.  She also relates nausea and lightheadedness upon standing denies any fever or loss of consciousness.    Assessment/Plan:   Ulcerative colitis with rectal bleeding ([place]) Started on IV steroids she is on no medications at baseline. Hemoglobin has remained relatively stable, is currently 13. Started on narcotics for analgesics. Was also started on mesalamine suppositories. Her GI doctor Dr. Almyra Free has been consulted for further recommendations. Along with diet.  Abnormal urinalysis UA shows significant white blood cells, urine cultures have been sent she was started on IV Rocephin empirically.  Hypokalemia Likely due to nausea and vomiting potassium 3.4 on admission she was started on oral repletion recheck in the morning is unremarkable.    DVT prophylaxis: scd Family Communication:none Status is: Observation The patient remains OBS appropriate and will d/c before 2 midnights.    Code Status:     Code Status Orders  (From admission, onward)           Start     Ordered   07/24/22 1832  Full code  Continuous       Question:  By:  Answer:  Consent: discussion documented in EHR   07/24/22 1832           Code Status History     Date Active Date Inactive Code Status Order ID Comments User Context   05/26/2018 1229 05/30/2018 2114 Full Code [id number]  Valinda Party, DO ED   02/20/2012 1807 02/22/2012 1841 Full Code [id number]  Alvina Chou, PA-C ED         IV Access:   Peripheral IV   Procedures and diagnostic  studies:   CT ANGIO GI BLEED  Result Date: 07/24/2022 CLINICAL DATA:  Rectal bleeding since this past Monday. History of ulcerative colitis. EXAM: CTA ABDOMEN AND PELVIS WITHOUT AND WITH CONTRAST TECHNIQUE: Multidetector CT imaging of the abdomen and pelvis was performed using the standard protocol during bolus administration of intravenous contrast. Multiplanar reconstructed images and MIPs were obtained and reviewed to evaluate the vascular anatomy. RADIATION DOSE REDUCTION: This exam was performed according to the departmental dose-optimization program which includes automated exposure control, adjustment of the mA and/or kV according to patient size and/or use of iterative reconstruction technique. CONTRAST:  186mL OMNIPAQUE IOHEXOL 350 MG/ML SOLN COMPARISON:  07/25/2020; 09/17/2018 FINDINGS: VASCULAR Aorta: There is no significant atherosclerotic plaque within the abdominal aorta. Widely patent without a hemodynamically significant narrowing. The abdominal aorta is of normal caliber. No abdominal aortic dissection or perivascular stranding. Celiac: Widely patent without hemodynamically significant narrowing. Conventional branching pattern. SMA: Widely patent without hemodynamically significant narrowing. Conventional branching pattern. The distal tributaries of the SMA are widely patent without discrete intraluminal filling defect to suggest distal embolism. Renals: Solitary bilaterally; the bilateral renal arteries are of normal caliber and widely patent without hemodynamically significant narrowing. No vessel irregularity to suggest FMD. IMA: Widely patent without hemodynamically significant narrowing. Inflow: The bilateral common, external and internal iliac arteries are of normal caliber and widely patent without hemodynamically significant narrowing. Proximal Outflow: The bilateral common and imaged portions of  the bilateral deep and superficial femoral arteries are of normal caliber and widely patent  without hemodynamically significant narrowing. Veins: The IVC and pelvic venous systems appear widely patent. Review of the MIP images confirms the above findings. _________________________________________________________ NON-VASCULAR Lower chest: Limited visualization of the lower thorax demonstrates minimal dependent subpleural ground-glass atelectasis. No discrete focal airspace opacities. No pleural effusion. Normal heart size.  No pericardial effusion. Hepatobiliary: There is mild diffuse decreased attenuation of the hepatic parenchyma on this postcontrast examination suggestive of hepatic steatosis. No discrete hepatic lesions. Normal appearance of the gallbladder given degree of distention. No radiopaque gallstones. No intra or extrahepatic biliary ductal dilatation. No ascites. Pancreas: Normal appearance of the pancreas. Spleen: Normal appearance of the spleen. Adrenals/Urinary Tract: There is symmetric enhancement of the bilateral kidneys. No evidence of nephrolithiasis on this postcontrast examination. No discrete renal lesions. No urine obstruction or perinephric stranding. Normal appearance of the bilateral adrenal glands. There is mild thickening of the urinary bladder wall, potentially accentuated due to underdistention. Stomach/Bowel: There are no discrete areas of intraluminal contrast extravasation to suggest acute GI bleeding. Suspected circumferential wall thickening involving the distal aspect of the sigmoid colon and rectum. No associated mesenteric stranding. Moderate colonic stool burden without evidence of enteric obstruction. Normal appearance of the terminal ileum and retrocecal appendix. Suspected small hiatal hernia. No pneumoperitoneum, pneumatosis or portal venous gas. Lymphatic: No bulky retroperitoneal, mesenteric, pelvic or inguinal lymphadenopathy. Reproductive: Normal appearance of the pelvic organs. No discrete adnexal lesion. No free fluid in the pelvic cul-de-sac. Other: Tiny  mesenteric fat containing periumbilical hernia. Minimal amount of subcutaneous edema about the midline of the low back. Musculoskeletal: No acute or aggressive osseous abnormalities. Mild degenerative change of the bilateral hips with joint space loss, subchondral sclerosis and osteophytosis. IMPRESSION: 1. No discrete areas of intraluminal contrast extravasation to suggest acute GI bleeding. 2. Suspected circumferential wall thickening involving the rectum and distal sigmoid colon, similar to prior examination performed 07/2020, nonspecific though could be seen in the setting of provided history of a localized inflammatory colitis. Clinical correlation is advised. Further evaluation with colonoscopy could be performed as indicated. 3. Moderate colonic stool burden without evidence of enteric obstruction. 4. Small hiatal hernia. 5. Suspected hepatic steatosis. Correlation with LFTs is advised. 6. No significant atherosclerotic plaque within a normal caliber abdominal aorta. Electronically Signed   By: Sandi Mariscal M.D.   On: 07/24/2022 13:33     Medical Consultants:   None.   Subjective:    AMERIKA LAVER continues to have bloody bowel movements, last 1 this morning.  Objective:    Vitals:   07/24/22 1759 07/24/22 2026 07/25/22 0000 07/25/22 0411  BP: (!) 141/59 (!) 157/87 (!) 148/79 125/80  Pulse: 63 72 95 64  Resp: 18     Temp: 98.2 F (36.8 C) 98.3 F (36.8 C) (!) 97.5 F (36.4 C) 97.9 F (36.6 C)  TempSrc: Oral Oral Oral Oral  SpO2: 100% 98% 98% 100%  Weight:      Height:       SpO2: 100 %   Intake/Output Summary (Last 24 hours) at 07/25/2022 0743 Last data filed at 07/25/2022 0600 Gross per 24 hour  Intake 825.51 ml  Output --  Net 825.51 ml   Filed Weights   07/24/22 1025  Weight: 103 kg    Exam: General exam: In no acute distress. Respiratory system: Good air movement and clear to auscultation. Cardiovascular system: S1 & S2 heard, RRR. No JVD. Gastrointestinal  system: Diffuse abdominal pain nondistended tender no rebound or guarding Extremities: No pedal edema. Skin: No rashes, lesions or ulcers Psychiatry: Judgement and insight appear normal. Mood & affect appropriate.    Data Reviewed:    Labs: Basic Metabolic Panel: Recent Labs  Lab 07/24/22 1100 07/25/22 0331  NA 139 136  K 3.4* 4.1  CL 108 107  CO2 22 22  GLUCOSE 101* 133*  BUN 14 8  CREATININE 0.86 0.65  CALCIUM 9.1 8.8*   GFR Estimated Creatinine Clearance: 92.3 mL/min (by C-G formula based on SCr of 0.65 mg/dL). Liver Function Tests: Recent Labs  Lab 07/24/22 1100  AST 17  ALT 16  ALKPHOS 85  BILITOT 0.4  PROT 7.1  ALBUMIN 4.0   Recent Labs  Lab 07/24/22 1100  LIPASE 32   No results for input(s): "AMMONIA" in the last 168 hours. Coagulation profile Recent Labs  Lab 07/24/22 1100  INR 1.0   COVID-19 Labs  No results for input(s): "DDIMER", "FERRITIN", "LDH", "CRP" in the last 72 hours.  Lab Results  Component Value Date   SARSCOV2NAA Detected (A) 04/08/2019   [place] Not Detected 03/29/2019    CBC: Recent Labs  Lab 07/24/22 1100 07/24/22 1859 07/25/22 0331  WBC 5.0  --  6.8  NEUTROABS 2.9  --   --   HGB 13.6 14.5 13.1  HCT 39.5 41.9 38.0  MCV 96.3  --  93.4  PLT 260  --  293   Cardiac Enzymes: No results for input(s): "CKTOTAL", "CKMB", "CKMBINDEX", "TROPONINI" in the last 168 hours. BNP (last 3 results) No results for input(s): "PROBNP" in the last 8760 hours. CBG: No results for input(s): "GLUCAP" in the last 168 hours. D-Dimer: No results for input(s): "DDIMER" in the last 72 hours. Hgb A1c: No results for input(s): "HGBA1C" in the last 72 hours. Lipid Profile: No results for input(s): "CHOL", "HDL", "LDLCALC", "TRIG", "CHOLHDL", "LDLDIRECT" in the last 72 hours. Thyroid function studies: No results for input(s): "TSH", "T4TOTAL", "T3FREE", "THYROIDAB" in the last 72 hours.  Invalid input(s): "FREET3" Anemia work up: No  results for input(s): "VITAMINB12", "FOLATE", "FERRITIN", "TIBC", "IRON", "RETICCTPCT" in the last 72 hours. Sepsis Labs: Recent Labs  Lab 07/24/22 1100 07/25/22 0331  WBC 5.0 6.8   Microbiology No results found for this or any previous visit (from the past 240 hour(s)).   Medications:    mesalamine  1,000 mg Rectal QHS   pantoprazole (PROTONIX) IV  40 mg Intravenous Q24H   sodium chloride flush  3 mL Intravenous Q12H   Continuous Infusions:  sodium chloride 75 mL/hr at 07/24/22 1943   cefTRIAXone (ROCEPHIN)  IV 1 g (07/24/22 2153)      LOS: 0 days   Charlynne Cousins  [facility]  07/25/2022, 7:43 AM

## 2022-07-25 NOTE — Consult Note (Addendum)
[facility] Gastroenterology Consult  Referring Provider: Dr. Melburn Popper hospitalist Primary Care Physician:  Azzie Glatter, FNP Primary Gastroenterologist: Technically unassigned(last seen Dr. Paulita Fujita in 2014, saw Dr. Benson Norway inpatient in 2020, no outpatient follow-up since then)  Reason for Consultation: Rectal bleeding  HPI: Crystal Benton is a 62 y.o. 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  Patient had a flexible sigmoidoscopy in 2010 with Dr. Fuller Plan: 6 mm inflammatory sigmoid polyp was removed, chronic active colitis noted in the rectum and sigmoid compatible with ulcerative colitis. Colonoscopy 06/05/2012, Dr. Paulita Fujita: Mildly active chronic colitis of left colon Colonoscopy 05/25/2018, Dr. Benson Norway: 3 mm cecal polyp removed, inflammation noted from anus to rectum up to 10 cm, biopsy showed mildly active chronic colitis recommended Canasa 1000 mg every night .  Patient states that because of COVID she did not follow-up with Dr. Blair Dolphin and has not received any GI care since discharge from the hospital in 2020.  She has noted intermittent rectal bleeding, diarrhea, generalized abdominal pain however symptoms have acutely worsened in the last several days. She complains of burning epigastric area. Denies nausea, vomiting, acid reflux, heartburn difficulty swallowing or pain on swallowing. No family history of inflammatory bowel disease or colon cancer.   Past Medical History:  Diagnosis Date   Acid reflux 01/2019   Arthritis    Right Knee   Diarrhea    Elevated cholesterol with elevated triglycerides 01/2019   GERD (gastroesophageal reflux disease)    Insomnia    Pneumonia due to 2019 novel coronavirus 04/2019   Ulcerative colitis    Vitamin deficiency 01/2019    Past Surgical History:  Procedure Laterality Date   ABDOMINAL SURGERY     BIOPSY  05/29/2018    Procedure: BIOPSY;  Surgeon: Carol Ada, MD;  Location: [facility] ENDOSCOPY;  Service: Endoscopy;;   COLON SURGERY     COLONOSCOPY WITH PROPOFOL N/A 05/29/2018   Procedure: COLONOSCOPY WITH PROPOFOL;  Surgeon: Carol Ada, MD;  Location: [place];  Service: Endoscopy;  Laterality: N/A;   POLYPECTOMY  05/29/2018   Procedure: POLYPECTOMY;  Surgeon: Carol Ada, MD;  Location: [place];  Service: Endoscopy;;    Prior to Admission medications   Medication Sig Start Date End Date Taking? Authorizing Provider  Ibuprofen-diphenhydrAMINE Cit 200-38 MG TABS Take 1 tablet by mouth at bedtime as needed (For sleep, pain).   Yes [provider]  ondansetron (ZOFRAN) 4 MG tablet Take 1 tablet (4 mg total) by mouth every 6 (six) hours. Patient taking differently: Take 4 mg by mouth every 6 (six) hours as needed for nausea or vomiting. 07/25/20  Yes Couture, Cortni S, PA-C  acetaminophen (TYLENOL) 500 MG tablet Take 1 tablet (500 mg total) by mouth 2 (two) times daily as needed. Patient not taking: Reported on 07/24/2022 09/28/19   Azzie Glatter, FNP  furosemide (LASIX) 20 MG tablet Take 1 tablet (20 mg total) by mouth daily. Patient not taking: Reported on 07/24/2022 09/28/19   Azzie Glatter, FNP  loperamide (IMODIUM A-D) 2 MG tablet Take 1 tablet (2 mg total) by mouth 4 (four) times daily as needed for diarrhea or loose stools. (Up to 8 tablets a day). Patient not taking: Reported on 07/24/2022 01/20/19   Azzie Glatter, FNP  metFORMIN (GLUCOPHAGE) 500 MG tablet Take 1 tablet (500 mg total) by mouth 2 (two) times daily with a meal. Patient not taking: Reported on  07/24/2022 09/28/19   Azzie Glatter, FNP  pantoprazole (PROTONIX) 20 MG tablet Take 1 tablet (20 mg total) by mouth daily for 14 days. Patient not taking: Reported on 07/24/2022 07/25/20 08/08/20  Couture, Cortni S, PA-C  traZODone (DESYREL) 100 MG tablet Take 1 tablet (100 mg total) by mouth at bedtime. Patient not taking:  Reported on 07/24/2022 09/28/19   Azzie Glatter, FNP  Vitamin D, Ergocalciferol, (DRISDOL) 1.25 MG (50000 UT) CAPS capsule Take 1 capsule (50,000 Units total) by mouth every 7 (seven) days. Patient not taking: Reported on 09/28/2019 01/25/19   Azzie Glatter, FNP    Current Facility-Administered Medications  Medication Dose Route Frequency Provider Last Rate Last Admin   acetaminophen (TYLENOL) tablet 650 mg  650 mg Oral Q6H PRN Norval Morton, MD       Or   acetaminophen (TYLENOL) suppository 650 mg  650 mg Rectal Q6H PRN Fuller Plan A, MD       albuterol (PROVENTIL) (2.5 MG/3ML) 0.083% nebulizer solution 2.5 mg  2.5 mg Nebulization Q6H PRN Tamala Julian, Rondell A, MD       cefTRIAXone (ROCEPHIN) 1 g in sodium chloride 0.9 % 100 mL IVPB  1 g Intravenous Q24H Smith, Rondell A, MD 200 mL/hr at 07/24/22 2153 1 g at 07/24/22 2153   HYDROcodone-acetaminophen (NORCO/VICODIN) 5-325 MG per tablet 1 tablet  1 tablet Oral Q6H PRN Norval Morton, MD   1 tablet at 07/25/22 0926   mesalamine (CANASA) suppository 1,000 mg  1,000 mg Rectal QHS Smith, Rondell A, MD       methylPREDNISolone sodium succinate (SOLU-MEDROL) 125 mg/2 mL injection 80 mg  80 mg Intravenous Q24H Charlynne Cousins, MD   80 mg at 07/25/22 0924   ondansetron (ZOFRAN) tablet 4 mg  4 mg Oral Q6H PRN Norval Morton, MD       Or   ondansetron (ZOFRAN) injection 4 mg  4 mg Intravenous Q6H PRN Fuller Plan A, MD   4 mg at 07/25/22 0920   pantoprazole (PROTONIX) injection 40 mg  40 mg Intravenous Q24H Smith, Rondell A, MD   40 mg at 07/25/22 [id number]   polyethylene glycol-electrolytes (NuLYTELY) solution 4,000 mL  4,000 mL Oral Once Ronnette Juniper, MD       sodium chloride flush (NS) 0.9 % injection 3 mL  3 mL Intravenous Q12H Smith, Rondell A, MD   3 mL at 07/25/22 0930    Allergies as of 07/24/2022 - Review Complete 07/24/2022  Allergen Reaction Noted   Onion Anaphylaxis 07/03/2015   Flagyl [metronidazole] Other (See Comments)  02/20/2012   Metoclopramide hcl Other (See Comments) 01/24/2009    Family History  Problem Relation Age of Onset   Hypertension Mother    Stroke Mother    Seizures Mother    Multiple sclerosis Sister     Social History   Socioeconomic History   Marital status: Divorced    Spouse name: Not on file   Number of children: Not on file   Years of education: Not on file   Highest education level: Not on file  Occupational History   Not on file  Tobacco Use   Smoking status: Never   Smokeless tobacco: Never  Vaping Use   Vaping Use: Never used  Substance and Sexual Activity   Alcohol use: Yes    Comment: occ   Drug use: No   Sexual activity: Yes  Other Topics Concern   Not on file  Social History Narrative  Not on file   Social Determinants of Health   Financial Resource Strain: Not on file  Food Insecurity: Not on file  Transportation Needs: Not on file  Physical Activity: Not on file  Stress: Not on file  Social Connections: Not on file  Intimate Partner Violence: Not on file    Review of Systems: As per HPI Physical Exam: Vital signs in last 24 hours: Temp:  [97.5 F (36.4 C)-98.3 F (36.8 C)] 97.9 F (36.6 C) (03/21 0411) Pulse Rate:  [63-95] 64 (03/21 0411) Resp:  [16-18] 18 (03/20 1759) BP: (120-157)/(59-87) 125/80 (03/21 0411) SpO2:  [97 %-100 %] 100 % (03/21 0411)    General:   Alert,  Well-developed, overweight, pleasant and cooperative in NAD Head:  Normocephalic and atraumatic. Eyes:  Sclera clear, no icterus.   Conjunctiva pink. Ears:  Normal auditory acuity. Nose:  No deformity, discharge,  or lesions. Mouth:  No deformity or lesions.  Oropharynx pink & moist. Neck:  Supple; no masses or thyromegaly. Lungs:  Clear throughout to auscultation.   No wheezes, crackles, or rhonchi. No acute distress. Heart:  Regular rate and rhythm; no murmurs, clicks, rubs,  or gallops. Extremities:  Without clubbing or edema. Neurologic:  Alert and  oriented  x4;  grossly normal neurologically. Skin:  Intact without significant lesions or rashes. Psych:  Alert and cooperative. Normal mood and affect. Abdomen:  Soft, nontender and nondistended. No masses, hepatosplenomegaly or hernias noted. Normal bowel sounds, without guarding, and without rebound.         Lab Results: Recent Labs    07/24/22 1100 07/24/22 1859 07/25/22 0331  WBC 5.0  --  6.8  HGB 13.6 14.5 13.1  HCT 39.5 41.9 38.0  PLT 260  --  293   BMET Recent Labs    07/24/22 1100 07/25/22 0331  NA 139 136  K 3.4* 4.1  CL 108 107  CO2 22 22  GLUCOSE 101* 133*  BUN 14 8  CREATININE 0.86 0.65  CALCIUM 9.1 8.8*   LFT Recent Labs    07/24/22 1100  PROT 7.1  ALBUMIN 4.0  AST 17  ALT 16  ALKPHOS 85  BILITOT 0.4   PT/INR Recent Labs    07/24/22 1100  LABPROT 12.8  INR 1.0    Studies/Results: CT ANGIO GI BLEED  Result Date: 07/24/2022 CLINICAL DATA:  Rectal bleeding since this past Monday. History of ulcerative colitis. EXAM: CTA ABDOMEN AND PELVIS WITHOUT AND WITH CONTRAST TECHNIQUE: Multidetector CT imaging of the abdomen and pelvis was performed using the standard protocol during bolus administration of intravenous contrast. Multiplanar reconstructed images and MIPs were obtained and reviewed to evaluate the vascular anatomy. RADIATION DOSE REDUCTION: This exam was performed according to the departmental dose-optimization program which includes automated exposure control, adjustment of the mA and/or kV according to patient size and/or use of iterative reconstruction technique. CONTRAST:  123mL OMNIPAQUE IOHEXOL 350 MG/ML SOLN COMPARISON:  07/25/2020; 09/17/2018 FINDINGS: VASCULAR Aorta: There is no significant atherosclerotic plaque within the abdominal aorta. Widely patent without a hemodynamically significant narrowing. The abdominal aorta is of normal caliber. No abdominal aortic dissection or perivascular stranding. Celiac: Widely patent without hemodynamically  significant narrowing. Conventional branching pattern. SMA: Widely patent without hemodynamically significant narrowing. Conventional branching pattern. The distal tributaries of the SMA are widely patent without discrete intraluminal filling defect to suggest distal embolism. Renals: Solitary bilaterally; the bilateral renal arteries are of normal caliber and widely patent without hemodynamically significant narrowing. No vessel irregularity to  suggest FMD. IMA: Widely patent without hemodynamically significant narrowing. Inflow: The bilateral common, external and internal iliac arteries are of normal caliber and widely patent without hemodynamically significant narrowing. Proximal Outflow: The bilateral common and imaged portions of the bilateral deep and superficial femoral arteries are of normal caliber and widely patent without hemodynamically significant narrowing. Veins: The IVC and pelvic venous systems appear widely patent. Review of the MIP images confirms the above findings. _________________________________________________________ NON-VASCULAR Lower chest: Limited visualization of the lower thorax demonstrates minimal dependent subpleural ground-glass atelectasis. No discrete focal airspace opacities. No pleural effusion. Normal heart size.  No pericardial effusion. Hepatobiliary: There is mild diffuse decreased attenuation of the hepatic parenchyma on this postcontrast examination suggestive of hepatic steatosis. No discrete hepatic lesions. Normal appearance of the gallbladder given degree of distention. No radiopaque gallstones. No intra or extrahepatic biliary ductal dilatation. No ascites. Pancreas: Normal appearance of the pancreas. Spleen: Normal appearance of the spleen. Adrenals/Urinary Tract: There is symmetric enhancement of the bilateral kidneys. No evidence of nephrolithiasis on this postcontrast examination. No discrete renal lesions. No urine obstruction or perinephric stranding. Normal  appearance of the bilateral adrenal glands. There is mild thickening of the urinary bladder wall, potentially accentuated due to underdistention. Stomach/Bowel: There are no discrete areas of intraluminal contrast extravasation to suggest acute GI bleeding. Suspected circumferential wall thickening involving the distal aspect of the sigmoid colon and rectum. No associated mesenteric stranding. Moderate colonic stool burden without evidence of enteric obstruction. Normal appearance of the terminal ileum and retrocecal appendix. Suspected small hiatal hernia. No pneumoperitoneum, pneumatosis or portal venous gas. Lymphatic: No bulky retroperitoneal, mesenteric, pelvic or inguinal lymphadenopathy. Reproductive: Normal appearance of the pelvic organs. No discrete adnexal lesion. No free fluid in the pelvic cul-de-sac. Other: Tiny mesenteric fat containing periumbilical hernia. Minimal amount of subcutaneous edema about the midline of the low back. Musculoskeletal: No acute or aggressive osseous abnormalities. Mild degenerative change of the bilateral hips with joint space loss, subchondral sclerosis and osteophytosis. IMPRESSION: 1. No discrete areas of intraluminal contrast extravasation to suggest acute GI bleeding. 2. Suspected circumferential wall thickening involving the rectum and distal sigmoid colon, similar to prior examination performed 07/2020, nonspecific though could be seen in the setting of provided history of a localized inflammatory colitis. Clinical correlation is advised. Further evaluation with colonoscopy could be performed as indicated. 3. Moderate colonic stool burden without evidence of enteric obstruction. 4. Small hiatal hernia. 5. Suspected hepatic steatosis. Correlation with LFTs is advised. 6. No significant atherosclerotic plaque within a normal caliber abdominal aorta. Electronically Signed   By: Sandi Mariscal M.D.   On: 07/24/2022 13:33    Impression: Rectal bleeding, bloody diarrhea,  history of ulcerative proctosigmoiditis  Hemoglobin 13.1 and stable, stable hemodynamics CAT scan shows circumferential wall thickening involving the rectum and distal sigmoid loop colitis inflammatory colitis, further evaluation with colonoscopy is indicated   Hepatic steatosis Small hiatal hernia  Plan: Diagnostic colonoscopy tomorrow with Dr. Alessandra Bevels. Patient will be started on clear liquid diet and given colonic prep today. The risks and the benefits of the procedure were discussed with the patient in details. She understands and verbalizes consent.  I had a detailed discussion with the patient that she has a chronic condition, ulcerative proctosigmoiditis versus ulcerative colitis and will need to be on medication for achieving remission and for maintenance. Discussed about importance of taking medications, compliance with outpatient follow-ups and periodic colonoscopies to assess disease severity. Will send ESR, CRP, fecal calprotectin HBsAg and TB  Gold testing. Patient has been appropriately started on IV Solu-Medrol 80 mg every 24 hours and Canasa suppository 1000 mg rectal at bedtime.   LOS: 0 days   Ronnette Juniper, MD  07/25/2022, 10:32 AM

## 2022-07-25 NOTE — Plan of Care (Signed)
  Problem: Education: Goal: Knowledge of General Education information will improve Description: Including pain rating scale, medication(s)/side effects and non-pharmacologic comfort measures Outcome: Progressing   Problem: Health Behavior/Discharge Planning: Goal: Ability to manage health-related needs will improve Outcome: Progressing   Problem: Clinical Measurements: Goal: Ability to maintain clinical measurements within normal limits will improve Outcome: Progressing Goal: Will remain free from infection Outcome: Progressing Goal: Diagnostic test results will improve Outcome: Progressing   Problem: Nutrition: Goal: Adequate nutrition will be maintained Outcome: Progressing   Problem: Elimination: Goal: Will not experience complications related to urinary retention Outcome: Progressing   Problem: Pain Managment: Goal: General experience of comfort will improve Outcome: Progressing   Problem: Safety: Goal: Ability to remain free from injury will improve Outcome: Progressing

## 2022-07-26 ENCOUNTER — Inpatient Hospital Stay (HOSPITAL_COMMUNITY): Payer: BLUE CROSS/BLUE SHIELD | Admitting: Certified Registered Nurse Anesthetist

## 2022-07-26 ENCOUNTER — Encounter (HOSPITAL_COMMUNITY)
Admission: EM | Disposition: A | Discharge status: Home / Self Care | Payer: Self-pay | Source: Home / Self Care | Attending: Internal Medicine

## 2022-07-26 ENCOUNTER — Encounter (HOSPITAL_COMMUNITY): Payer: Self-pay | Admitting: Internal Medicine

## 2022-07-26 DIAGNOSIS — K51911 Ulcerative colitis, unspecified with rectal bleeding: Secondary | ICD-10-CM | POA: Diagnosis not present

## 2022-07-26 DIAGNOSIS — K51311 Ulcerative (chronic) rectosigmoiditis with rectal bleeding: Secondary | ICD-10-CM | POA: Diagnosis not present

## 2022-07-26 HISTORY — PX: COLONOSCOPY WITH PROPOFOL: SHX5780

## 2022-07-26 HISTORY — PX: BIOPSY: SHX5522

## 2022-07-26 LAB — URINE CULTURE: Culture: NO GROWTH

## 2022-07-26 LAB — HIGH SENSITIVITY CRP: CRP, High Sensitivity: 11.38 mg/L — ABNORMAL HIGH (ref 0.00–3.00)

## 2022-07-26 SURGERY — COLONOSCOPY WITH PROPOFOL
Anesthesia: Monitor Anesthesia Care

## 2022-07-26 MED ORDER — SODIUM CHLORIDE 0.9 % IV SOLN: INTRAVENOUS | Status: DC

## 2022-07-26 MED ORDER — LACTATED RINGERS IV SOLN: INTRAVENOUS | Status: DC

## 2022-07-26 MED ORDER — PREDNISONE 20 MG PO TABS
40.0000 mg | ORAL_TABLET | Freq: Every day | ORAL | Status: DC
  Administered 2022-07-27 – 2022-07-28 (×2): 40 mg via ORAL
  Filled 2022-07-26 (×2): qty 2

## 2022-07-26 MED ORDER — PROPOFOL 500 MG/50ML IV EMUL
INTRAVENOUS | Status: DC | PRN
  Administered 2022-07-26: 150 ug/kg/min via INTRAVENOUS

## 2022-07-26 MED ORDER — HYDROCORTISONE (PERIANAL) 2.5 % EX CREA
TOPICAL_CREAM | Freq: Two times a day (BID) | CUTANEOUS | Status: DC
  Filled 2022-07-26 (×2): qty 28.35

## 2022-07-26 MED ORDER — PROPOFOL 10 MG/ML IV BOLUS
INTRAVENOUS | Status: DC | PRN
  Administered 2022-07-26 (×2): 40 mg via INTRAVENOUS

## 2022-07-26 SURGICAL SUPPLY — 22 items

## 2022-07-26 NOTE — Interval H&P Note (Signed)
History and Physical Interval Note:  07/26/2022 1:11 PM  Crystal Benton  has presented today for surgery, with the diagnosis of History of UC, rectal diarrhea and bleeding.  The various methods of treatment have been discussed with the patient and family. After consideration of risks, benefits and other options for treatment, the patient has consented to  Procedure(s): COLONOSCOPY WITH PROPOFOL (N/A) as a surgical intervention.  The patient's history has been reviewed, patient examined, no change in status, stable for surgery.  I have reviewed the patient's chart and labs.  Questions were answered to the patient's satisfaction.     Paralee Pendergrass

## 2022-07-26 NOTE — Anesthesia Procedure Notes (Signed)
Procedure Name: MAC Date/Time: 07/26/2022 1:50 PM  Performed by: Lieutenant Diego, CRNAPre-anesthesia Checklist: Patient identified, Emergency Drugs available, Suction available, Patient being monitored and Timeout performed Patient Re-evaluated:Patient Re-evaluated prior to induction Oxygen Delivery Method: Nasal cannula Preoxygenation: Pre-oxygenation with 100% oxygen Induction Type: IV induction

## 2022-07-26 NOTE — Brief Op Note (Signed)
07/26/2022  2:15 PM  PATIENT:  Crystal Benton  62 y.o. female  PRE-OPERATIVE DIAGNOSIS:  History of UC, rectal diarrhea and bleeding  POST-OPERATIVE DIAGNOSIS:  Hemorrhoids; Poor bowel prep; Rectosigmoid and rectal bx r/o UC  PROCEDURE:  Procedure(s): COLONOSCOPY WITH PROPOFOL (N/A) BIOPSY  SURGEON:  Surgeon(s) and Role:    * Sharalyn Lomba, MD - Primary  Findings ---------- -Colonoscopy showed poor prep with retained stool throughout the colon.  Limited visualization of the mucosa showed mild inflammation in the rectum but otherwise normal mucosa in the rest of the colon.  Biopsies taken from rectum rectosigmoid colon. -medium size internal hemorrhoids.  Recommendations --------------------------- -Change IV Solu-Medrol to prednisone 40 mg p.o. daily.  Recommend to taper prednisone 10 mg every week until she is done with the prednisone.  Total 4 weeks taper. -Continue Canasa suppository 1000 mg every night -Recommend hydrocortisone cream twice a day for hemorrhoids. -Avoid NSAIDs -No further inpatient GI workup planned.  Follow-up in GI clinic in 2 to 3 months after discharge -  GI will sign off.  Call us back if needed.  Otis Brace MD, [place] 07/26/2022, 2:18 PM  Contact #  [phone]

## 2022-07-26 NOTE — Transfer of Care (Signed)
Immediate Anesthesia Transfer of Care Note  Patient: Crystal Benton  Procedure(s) Performed: COLONOSCOPY WITH PROPOFOL BIOPSY  Patient Location: Endoscopy Unit  Anesthesia Type:MAC  Level of Consciousness: drowsy  Airway & Oxygen Therapy: Patient Spontanous Breathing and Patient connected to nasal cannula oxygen  Post-op Assessment: Report given to RN and Post -op Vital signs reviewed and stable  Post vital signs: Reviewed and stable  Last Vitals:  Vitals Value Taken Time  BP    Temp    Pulse    Resp    SpO2      Last Pain:  Vitals:   07/26/22 1243  TempSrc: Temporal  PainSc: 6          Complications: No notable events documented.

## 2022-07-26 NOTE — Plan of Care (Signed)

## 2022-07-26 NOTE — Anesthesia Postprocedure Evaluation (Signed)
Anesthesia Post Note  Patient: Crystal Benton  Procedure(s) Performed: COLONOSCOPY WITH PROPOFOL BIOPSY     Patient location during evaluation: Endoscopy Anesthesia Type: MAC Level of consciousness: awake and alert, patient cooperative and oriented Pain management: pain level controlled Vital Signs Assessment: post-procedure vital signs reviewed and stable Respiratory status: spontaneous breathing, nonlabored ventilation and respiratory function stable Cardiovascular status: stable and blood pressure returned to baseline Postop Assessment: no apparent nausea or vomiting Anesthetic complications: no   No notable events documented.  Last Vitals:  Vitals:   07/26/22 1420 07/26/22 1429  BP: 127/69 138/62  Pulse: 66 70  Resp: 17 13  Temp:    SpO2: 97% 98%    Last Pain:  Vitals:   07/26/22 1429  TempSrc:   PainSc: 9                  Wallis Spizzirri,E. Brodan Grewell

## 2022-07-26 NOTE — Anesthesia Preprocedure Evaluation (Addendum)
Anesthesia Evaluation  Patient identified by MRN, date of birth, ID band Patient awake    Reviewed: Allergy & Precautions, NPO status , Patient's Chart, lab work & pertinent test results  History of Anesthesia Complications Negative for: history of anesthetic complications  Airway Mallampati: II  TM Distance: >3 FB Neck ROM: Full    Dental  (+) Edentulous Upper, Dental Advisory Given   Pulmonary neg pulmonary ROS   breath sounds clear to auscultation       Cardiovascular hypertension, Pt. on medications  Rhythm:Regular Rate:Normal     Neuro/Psych negative neurological ROS     GI/Hepatic ,GERD  Controlled,,Ulcerative colitis   Endo/Other  diabetes, Oral Hypoglycemic Agents  BMI 35  Renal/GU      Musculoskeletal  (+) Arthritis ,    Abdominal  (+) + obese  Peds  Hematology   Anesthesia Other Findings   Reproductive/Obstetrics                              Anesthesia Physical Anesthesia Plan  ASA: 3  Anesthesia Plan: MAC   Post-op Pain Management: Minimal or no pain anticipated   Induction:   PONV Risk Score and Plan: 2 and Ondansetron and Treatment may vary due to age or medical condition  Airway Management Planned: Natural Airway and Simple Face Mask  Additional Equipment: None  Intra-op Plan:   Post-operative Plan:   Informed Consent: I have reviewed the patients History and Physical, chart, labs and discussed the procedure including the risks, benefits and alternatives for the proposed anesthesia with the patient or authorized representative who has indicated his/her understanding and acceptance.     Dental advisory given  Plan Discussed with:   Anesthesia Plan Comments:          Anesthesia Quick Evaluation

## 2022-07-26 NOTE — Op Note (Signed)
[facility] Patient Name: Crystal Benton Procedure Date : 07/26/2022 MRN: [id number] Attending MD: Otis Brace , MD, [id number] Date of Birth: 1960-11-07 CSN: [id number] Age: 62 Admit Type: Inpatient Procedure:                Colonoscopy Indications:              Rectal bleeding Providers:                Otis Brace, MD, Dulcy Fanny, Janee Morn, Technician, Benetta Spar, Technician Referring MD:              Medicines:                Sedation Administered by an Anesthesia Professional Complications:            No immediate complications. Estimated Blood Loss:     Estimated blood loss was minimal. Procedure:                Pre-Anesthesia Assessment:                           - Benton to the procedure, a History and Physical                            was performed, and patient medications and                            allergies were reviewed. The patient's tolerance of                            previous anesthesia was also reviewed. The risks                            and benefits of the procedure and the sedation                            options and risks were discussed with the patient.                            All questions were answered, and informed consent                            was obtained. Benton Anticoagulants: The patient has                            taken no anticoagulant or antiplatelet agents. ASA                            Grade Assessment: III - A patient with severe                            systemic disease. After reviewing the risks and  benefits, the patient was deemed in satisfactory                            condition to undergo the procedure.                           After obtaining informed consent, the colonoscope                            was passed under direct vision. Throughout the                            procedure, the patient's blood pressure, pulse,  and                            oxygen saturations were monitored continuously. The                            [id number]) Olympus colonoscope was                            introduced through the anus and advanced to the the                            cecum, identified by the ileocecal valve. The                            colonoscopy was technically difficult and complex                            due to poor bowel prep with stool present. The                            patient tolerated the procedure well. The quality                            of the bowel preparation was poor. Scope In: 1:50:57 PM Scope Out: 2:03:46 PM Scope Withdrawal Time: 0 hours 8 minutes 44 seconds  Total Procedure Duration: 0 hours 12 minutes 49 seconds  Findings:      Hemorrhoids were found on perianal exam.      A large amount of semi-liquid semi-solid stool was found in the entire       colon, interfering with visualization.      Diffuse mild inflammation characterized by congestion (edema), erythema       and friability was found in the rectum and at 15 cm proximal to the       anus. Biopsies were taken with a cold forceps for histology.      Internal hemorrhoids were found during retroflexion. The hemorrhoids       were medium-sized.      Retroflexion in the rectum was not performed. Impression:               - Preparation of the colon was poor.                           -  Hemorrhoids found on perianal exam.                           - Stool in the entire examined colon.                           - Diffuse mild inflammation was found in the rectum                            and at 15 cm proximal to the anus secondary to                            proctitis ulcerative colitis. Biopsied.                           - Internal hemorrhoids. Recommendation:           - Return patient to hospital ward for ongoing care.                           - Resume previous diet.                           -  Continue present medications.                           - Await pathology results.                           - No ibuprofen, naproxen, or other non-steroidal                            anti-inflammatory drugs.                           - Use Canasa 1000 mg suppository 1 per rectum QHS. 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 All rights reserved. The codes documented in this report  are preliminary and upon coder review may  be revised to meet current compliance requirements. Otis Brace, MD Otis Brace, MD 07/26/2022 2:14:01 PM Number of Addenda: 0

## 2022-07-26 NOTE — Progress Notes (Signed)
[facility] PROGRESS NOTE    Progress Note  Crystal Benton  [id number] DOB: Dec 28, 1960 DOA: 07/24/2022 PCP: Azzie Glatter, FNP     Brief Narrative:   Crystal Benton is an 62 y.o. female past medical history significant for ulcerative colitis diagnosed back in 2010, GERD comes in complaining of abdominal pain, accompanied by nausea and vomiting and an episode of large bowel movements with large blood clots, no hematemesis.  She also relates nausea and lightheadedness upon standing denies any fever or loss of consciousness. Assessment/Plan:   Ulcerative colitis with rectal bleeding ([place]) Started on IV steroids she is on no medications at baseline. Hemoglobin has remained relatively stable, is currently 13. GI was consulted recommended colonoscopy on 07/26/2022. Currently on a clear liquid diet. Abnormal urinalysis UA shows significant white blood cells, urine cultures have been sent she was started on IV Rocephin empirically.  Hypokalemia Likely due to nausea and vomiting potassium 3.4 on admission she was started on oral repletion recheck in the morning is unremarkable.    DVT prophylaxis: scd Family Communication:none Status is: Observation The patient remains OBS appropriate and will d/c before 2 midnights.    Code Status:     Code Status Orders  (From admission, onward)           Start     Ordered   07/24/22 1832  Full code  Continuous       Question:  By:  Answer:  Consent: discussion documented in EHR   07/24/22 1832           Code Status History     Date Active Date Inactive Code Status Order ID Comments User Context   05/26/2018 1229 05/30/2018 2114 Full Code [id number]  Valinda Party, DO ED   02/20/2012 1807 02/22/2012 1841 Full Code [id number]  Alvina Chou, PA-C ED         IV Access:   Peripheral IV   Procedures and diagnostic studies:   CT ANGIO GI BLEED  Result Date: 07/24/2022 CLINICAL DATA:  Rectal bleeding  since this past Monday. History of ulcerative colitis. EXAM: CTA ABDOMEN AND PELVIS WITHOUT AND WITH CONTRAST TECHNIQUE: Multidetector CT imaging of the abdomen and pelvis was performed using the standard protocol during bolus administration of intravenous contrast. Multiplanar reconstructed images and MIPs were obtained and reviewed to evaluate the vascular anatomy. RADIATION DOSE REDUCTION: This exam was performed according to the departmental dose-optimization program which includes automated exposure control, adjustment of the mA and/or kV according to patient size and/or use of iterative reconstruction technique. CONTRAST:  161mL OMNIPAQUE IOHEXOL 350 MG/ML SOLN COMPARISON:  07/25/2020; 09/17/2018 FINDINGS: VASCULAR Aorta: There is no significant atherosclerotic plaque within the abdominal aorta. Widely patent without a hemodynamically significant narrowing. The abdominal aorta is of normal caliber. No abdominal aortic dissection or perivascular stranding. Celiac: Widely patent without hemodynamically significant narrowing. Conventional branching pattern. SMA: Widely patent without hemodynamically significant narrowing. Conventional branching pattern. The distal tributaries of the SMA are widely patent without discrete intraluminal filling defect to suggest distal embolism. Renals: Solitary bilaterally; the bilateral renal arteries are of normal caliber and widely patent without hemodynamically significant narrowing. No vessel irregularity to suggest FMD. IMA: Widely patent without hemodynamically significant narrowing. Inflow: The bilateral common, external and internal iliac arteries are of normal caliber and widely patent without hemodynamically significant narrowing. Proximal Outflow: The bilateral common and imaged portions of the bilateral deep and superficial femoral arteries are of normal caliber and widely patent without hemodynamically  significant narrowing. Veins: The IVC and pelvic venous systems  appear widely patent. Review of the MIP images confirms the above findings. _________________________________________________________ NON-VASCULAR Lower chest: Limited visualization of the lower thorax demonstrates minimal dependent subpleural ground-glass atelectasis. No discrete focal airspace opacities. No pleural effusion. Normal heart size.  No pericardial effusion. Hepatobiliary: There is mild diffuse decreased attenuation of the hepatic parenchyma on this postcontrast examination suggestive of hepatic steatosis. No discrete hepatic lesions. Normal appearance of the gallbladder given degree of distention. No radiopaque gallstones. No intra or extrahepatic biliary ductal dilatation. No ascites. Pancreas: Normal appearance of the pancreas. Spleen: Normal appearance of the spleen. Adrenals/Urinary Tract: There is symmetric enhancement of the bilateral kidneys. No evidence of nephrolithiasis on this postcontrast examination. No discrete renal lesions. No urine obstruction or perinephric stranding. Normal appearance of the bilateral adrenal glands. There is mild thickening of the urinary bladder wall, potentially accentuated due to underdistention. Stomach/Bowel: There are no discrete areas of intraluminal contrast extravasation to suggest acute GI bleeding. Suspected circumferential wall thickening involving the distal aspect of the sigmoid colon and rectum. No associated mesenteric stranding. Moderate colonic stool burden without evidence of enteric obstruction. Normal appearance of the terminal ileum and retrocecal appendix. Suspected small hiatal hernia. No pneumoperitoneum, pneumatosis or portal venous gas. Lymphatic: No bulky retroperitoneal, mesenteric, pelvic or inguinal lymphadenopathy. Reproductive: Normal appearance of the pelvic organs. No discrete adnexal lesion. No free fluid in the pelvic cul-de-sac. Other: Tiny mesenteric fat containing periumbilical hernia. Minimal amount of subcutaneous edema  about the midline of the low back. Musculoskeletal: No acute or aggressive osseous abnormalities. Mild degenerative change of the bilateral hips with joint space loss, subchondral sclerosis and osteophytosis. IMPRESSION: 1. No discrete areas of intraluminal contrast extravasation to suggest acute GI bleeding. 2. Suspected circumferential wall thickening involving the rectum and distal sigmoid colon, similar to prior examination performed 07/2020, nonspecific though could be seen in the setting of provided history of a localized inflammatory colitis. Clinical correlation is advised. Further evaluation with colonoscopy could be performed as indicated. 3. Moderate colonic stool burden without evidence of enteric obstruction. 4. Small hiatal hernia. 5. Suspected hepatic steatosis. Correlation with LFTs is advised. 6. No significant atherosclerotic plaque within a normal caliber abdominal aorta. Electronically Signed   By: Sandi Mariscal M.D.   On: 07/24/2022 13:33     Medical Consultants:   None.   Subjective:    Crystal Benton no complaints this morning denies abdominal pain.  Objective:    Vitals:   07/25/22 1557 07/25/22 2155 07/26/22 0435 07/26/22 0729  BP: 139/64 125/62 (!) 122/55 139/77  Pulse: 72 73 66 66  Resp: 17   16  Temp: 97.8 F (36.6 C) 97.9 F (36.6 C) 98 F (36.7 C) 98 F (36.7 C)  TempSrc: Oral Oral Oral Oral  SpO2: 99% 99% 98% 100%  Weight:      Height:       SpO2: 100 %   Intake/Output Summary (Last 24 hours) at 07/26/2022 0826 Last data filed at 07/25/2022 1500 Gross per 24 hour  Intake 240 ml  Output --  Net 240 ml    Filed Weights   07/24/22 1025  Weight: 103 kg    Exam: General exam: In no acute distress. Respiratory system: Good air movement and clear to auscultation. Cardiovascular system: S1 & S2 heard, RRR. No JVD. Gastrointestinal system: Abdomen is nondistended, soft and nontender.  Psychiatry: Judgement and insight appear normal. Mood & affect  appropriate. Data Reviewed:  Labs: Basic Metabolic Panel: Recent Labs  Lab 07/24/22 1100 07/25/22 0331  NA 139 136  K 3.4* 4.1  CL 108 107  CO2 22 22  GLUCOSE 101* 133*  BUN 14 8  CREATININE 0.86 0.65  CALCIUM 9.1 8.8*    GFR Estimated Creatinine Clearance: 92.3 mL/min (by C-G formula based on SCr of 0.65 mg/dL). Liver Function Tests: Recent Labs  Lab 07/24/22 1100  AST 17  ALT 16  ALKPHOS 85  BILITOT 0.4  PROT 7.1  ALBUMIN 4.0    Recent Labs  Lab 07/24/22 1100  LIPASE 32    No results for input(s): "AMMONIA" in the last 168 hours. Coagulation profile Recent Labs  Lab 07/24/22 1100  INR 1.0    COVID-19 Labs  No results for input(s): "DDIMER", "FERRITIN", "LDH", "CRP" in the last 72 hours.  Lab Results  Component Value Date   SARSCOV2NAA Detected (A) 04/08/2019   [place] Not Detected 03/29/2019    CBC: Recent Labs  Lab 07/24/22 1100 07/24/22 1859 07/25/22 0331  WBC 5.0  --  6.8  NEUTROABS 2.9  --   --   HGB 13.6 14.5 13.1  HCT 39.5 41.9 38.0  MCV 96.3  --  93.4  PLT 260  --  293    Cardiac Enzymes: No results for input(s): "CKTOTAL", "CKMB", "CKMBINDEX", "TROPONINI" in the last 168 hours. BNP (last 3 results) No results for input(s): "PROBNP" in the last 8760 hours. CBG: No results for input(s): "GLUCAP" in the last 168 hours. D-Dimer: No results for input(s): "DDIMER" in the last 72 hours. Hgb A1c: No results for input(s): "HGBA1C" in the last 72 hours. Lipid Profile: No results for input(s): "CHOL", "HDL", "LDLCALC", "TRIG", "CHOLHDL", "LDLDIRECT" in the last 72 hours. Thyroid function studies: No results for input(s): "TSH", "T4TOTAL", "T3FREE", "THYROIDAB" in the last 72 hours.  Invalid input(s): "FREET3" Anemia work up: No results for input(s): "VITAMINB12", "FOLATE", "FERRITIN", "TIBC", "IRON", "RETICCTPCT" in the last 72 hours. Sepsis Labs: Recent Labs  Lab 07/24/22 1100 07/25/22 0331  WBC 5.0 6.8     Microbiology No results found for this or any previous visit (from the past 240 hour(s)).   Medications:    mesalamine  1,000 mg Rectal QHS   methylPREDNISolone (SOLU-MEDROL) injection  80 mg Intravenous Q24H   pantoprazole (PROTONIX) IV  40 mg Intravenous Q24H   sodium chloride flush  3 mL Intravenous Q12H   Continuous Infusions:  sodium chloride 20 mL/hr at 07/26/22 0802   cefTRIAXone (ROCEPHIN)  IV 1 g (07/25/22 2203)      LOS: 1 day   Charlynne Cousins  [facility]  07/26/2022, 8:26 AM

## 2022-07-27 DIAGNOSIS — K51911 Ulcerative colitis, unspecified with rectal bleeding: Secondary | ICD-10-CM | POA: Diagnosis not present

## 2022-07-27 DIAGNOSIS — K51311 Ulcerative (chronic) rectosigmoiditis with rectal bleeding: Secondary | ICD-10-CM | POA: Diagnosis not present

## 2022-07-27 NOTE — Progress Notes (Signed)
[facility] PROGRESS NOTE    Progress Note  Crystal Benton  [id number] DOB: [birth year] DOA: 07/24/2022 PCP: Azzie Glatter, FNP     Brief Narrative:   Crystal Benton is an 62 y.o. female past medical history significant for ulcerative colitis diagnosed back in 2010, GERD comes in complaining of abdominal pain, accompanied by nausea and vomiting and an episode of large bowel movements with large blood clots, no hematemesis.  She also relates nausea and lightheadedness upon standing denies any fever or loss of consciousness. Assessment/Plan:   Ulcerative colitis with rectal bleeding ([place]) Started on IV steroids she is on no medications at baseline. Hemoglobin has remained relatively stable, is currently 13. GI was consulted recommended colonoscopy on 07/26/2022, that showed ulcerative colitis. Further management per GI advance diet as tolerated.  Abnormal urinalysis Complete 3-day course, urine culture is negative.  Hypokalemia Likely due to nausea and vomiting potassium 3.4 on admission she was started on oral repletion recheck in the morning is unremarkable.    DVT prophylaxis: scd Family Communication:none Status is: Observation The patient remains OBS appropriate and will d/c before 2 midnights.    Code Status:     Code Status Orders  (From admission, onward)           Start     Ordered   07/24/22 1832  Full code  Continuous       Question:  By:  Answer:  Consent: discussion documented in EHR   07/24/22 1832           Code Status History     Date Active Date Inactive Code Status Order ID Comments User Context   05/26/2018 1229 05/30/2018 2114 Full Code [id number]  Valinda Party, DO ED   02/20/2012 1807 02/22/2012 1841 Full Code [id number]  Alvina Chou, PA-C ED         IV Access:   Peripheral IV   Procedures and diagnostic studies:   No results found.   Medical Consultants:   None.   Subjective:    Crystal Aldous  Benton abdominal pain is improved tolerating her diet.  Objective:    Vitals:   07/26/22 1543 07/26/22 2144 07/27/22 0533 07/27/22 0727  BP: (!) 148/79 133/61 130/74 112/62  Pulse: 65 69 71 66  Resp: 16 18 18 16   Temp: 98.2 F (36.8 C) 97.8 F (36.6 C) 97.8 F (36.6 C) 98 F (36.7 C)  TempSrc: Oral Oral Oral Oral  SpO2: 100% 97% 99% 98%  Weight:      Height:       SpO2: 98 %   Intake/Output Summary (Last 24 hours) at 07/27/2022 0845 Last data filed at 07/26/2022 2336 Gross per 24 hour  Intake 440 ml  Output --  Net 440 ml    Filed Weights   07/24/22 1025  Weight: 103 kg    Exam: General exam: In no acute distress. Respiratory system: Good air movement and clear to auscultation. Cardiovascular system: S1 & S2 heard, RRR. No JVD. Gastrointestinal system: Abdomen is nondistended, soft and nontender.  Extremities: No pedal edema. Skin: No rashes, lesions or ulcers Psychiatry: Judgement and insight appear normal. Mood & affect appropriate. Data Reviewed:    Labs: Basic Metabolic Panel: Recent Labs  Lab 07/24/22 1100 07/25/22 0331  NA 139 136  K 3.4* 4.1  CL 108 107  CO2 22 22  GLUCOSE 101* 133*  BUN 14 8  CREATININE 0.86 0.65  CALCIUM 9.1 8.8*    GFR Estimated  Creatinine Clearance: 92.3 mL/min (by C-G formula based on SCr of 0.65 mg/dL). Liver Function Tests: Recent Labs  Lab 07/24/22 1100  AST 17  ALT 16  ALKPHOS 85  BILITOT 0.4  PROT 7.1  ALBUMIN 4.0    Recent Labs  Lab 07/24/22 1100  LIPASE 32    No results for input(s): "AMMONIA" in the last 168 hours. Coagulation profile Recent Labs  Lab 07/24/22 1100  INR 1.0    COVID-19 Labs  No results for input(s): "DDIMER", "FERRITIN", "LDH", "CRP" in the last 72 hours.  Lab Results  Component Value Date   SARSCOV2NAA Detected (A) 04/08/2019   [place] Not Detected 03/29/2019    CBC: Recent Labs  Lab 07/24/22 1100 07/24/22 1859 07/25/22 0331  WBC 5.0  --  6.8  NEUTROABS  2.9  --   --   HGB 13.6 14.5 13.1  HCT 39.5 41.9 38.0  MCV 96.3  --  93.4  PLT 260  --  293    Cardiac Enzymes: No results for input(s): "CKTOTAL", "CKMB", "CKMBINDEX", "TROPONINI" in the last 168 hours. BNP (last 3 results) No results for input(s): "PROBNP" in the last 8760 hours. CBG: No results for input(s): "GLUCAP" in the last 168 hours. D-Dimer: No results for input(s): "DDIMER" in the last 72 hours. Hgb A1c: No results for input(s): "HGBA1C" in the last 72 hours. Lipid Profile: No results for input(s): "CHOL", "HDL", "LDLCALC", "TRIG", "CHOLHDL", "LDLDIRECT" in the last 72 hours. Thyroid function studies: No results for input(s): "TSH", "T4TOTAL", "T3FREE", "THYROIDAB" in the last 72 hours.  Invalid input(s): "FREET3" Anemia work up: No results for input(s): "VITAMINB12", "FOLATE", "FERRITIN", "TIBC", "IRON", "RETICCTPCT" in the last 72 hours. Sepsis Labs: Recent Labs  Lab 07/24/22 1100 07/25/22 0331  WBC 5.0 6.8    Microbiology Recent Results (from the past 240 hour(s))  Urine Culture (for pregnant, neutropenic or urologic patients or patients with an indwelling urinary catheter)     Status: None   Collection Time: 07/25/22  4:18 AM   Specimen: Urine, Clean Catch  Result Value Ref Range Status   Specimen Description URINE, CLEAN CATCH  Final   Special Requests NONE  Final   Culture   Final    NO GROWTH Performed at [facility], [place] [address] [place], [place] [address]    Report Status 07/26/2022 FINAL  Final     Medications:    hydrocortisone   Rectal BID   mesalamine  1,000 mg Rectal QHS   pantoprazole (PROTONIX) IV  40 mg Intravenous Q24H   predniSONE  40 mg Oral Q breakfast   sodium chloride flush  3 mL Intravenous Q12H   Continuous Infusions:  cefTRIAXone (ROCEPHIN)  IV Stopped (07/26/22 2146)      LOS: 2 days   Crystal Benton  [facility]  07/27/2022, 8:45 AM

## 2022-07-27 NOTE — Plan of Care (Signed)

## 2022-07-28 DIAGNOSIS — K51311 Ulcerative (chronic) rectosigmoiditis with rectal bleeding: Secondary | ICD-10-CM | POA: Diagnosis not present

## 2022-07-28 DIAGNOSIS — K51911 Ulcerative colitis, unspecified with rectal bleeding: Secondary | ICD-10-CM | POA: Diagnosis not present

## 2022-07-28 MED ORDER — MESALAMINE 1000 MG RE SUPP: 1000.0000 mg | Freq: Every day | RECTAL | 12 refills | Status: AC

## 2022-07-28 MED ORDER — PREDNISONE 20 MG PO TABS: ORAL_TABLET | ORAL | 0 refills | Status: AC

## 2022-07-28 MED ORDER — HYDROCORTISONE (PERIANAL) 2.5 % EX CREA: TOPICAL_CREAM | Freq: Two times a day (BID) | CUTANEOUS | 0 refills | Status: AC

## 2022-07-28 NOTE — Discharge Summary (Signed)
Physician Discharge Summary  Crystal Benton [id number] DOB: 1961-02-22 DOA: 07/24/2022  PCP: Azzie Glatter, FNP  Admit date: 07/24/2022 Discharge date: 07/28/2022  Admitted From: Home Disposition:  Home  Recommendations for Outpatient Follow-up:  Follow up with PCP in 1-2 weeks Please obtain BMP/CBC in one week   Home Health:No Equipment/Devices:None  Discharge Condition:Stable CODE STATUS:Full Diet recommendation: Heart Healthy   Brief/Interim Summary: 62 y.o. female past medical history significant for ulcerative colitis diagnosed back in 2010, GERD comes in complaining of abdominal pain, accompanied by nausea and vomiting and an episode of large bowel movements with large blood clots, no hematemesis.  She also relates nausea and lightheadedness upon standing denies any fever or loss of consciousness.   Discharge Diagnoses:  Principal Problem:   Ulcerative colitis with rectal bleeding (HCC) Active Problems:   Abnormal urinalysis   Hypokalemia   Ulcerative colitis (HCC)  Ulcerative colitis with rectal bleeding: Will start on IV steroids GI was consulted to perform a colonoscopy-ulcerative colitis they recommended to continue steroids normalizing and she will follow-up with them as an outpatient.  Abnormal urinalysis: She appears 3-day course of IV Rocephin, she relates her dysuria improved.  Unfortunately her urine cultures were not sent.  Hypokalemia: Likely due to nausea and vomiting this was repeated now resolved  Discharge Instructions  Discharge Instructions     Diet - low sodium heart healthy   Complete by: As directed    Increase activity slowly   Complete by: As directed       Allergies as of 07/28/2022       Reactions   Onion Anaphylaxis   Flagyl [metronidazole] Other (See Comments)   Makes her feel like she's dying   Metoclopramide Hcl Other (See Comments)   cramping        Medication List     STOP taking these medications     acetaminophen 500 MG tablet Commonly known as: TYLENOL   furosemide 20 MG tablet Commonly known as: LASIX   loperamide 2 MG tablet Commonly known as: Imodium A-D   ondansetron 4 MG tablet Commonly known as: ZOFRAN       TAKE these medications    hydrocortisone 2.5 % rectal cream Commonly known as: ANUSOL-HC Place rectally 2 (two) times daily.   Ibuprofen-diphenhydrAMINE Cit 200-38 MG Tabs Take 1 tablet by mouth at bedtime as needed (For sleep, pain).   mesalamine 1000 MG suppository Commonly known as: CANASA Place 1 suppository (1,000 mg total) rectally at bedtime.   metFORMIN 500 MG tablet Commonly known as: GLUCOPHAGE Take 1 tablet (500 mg total) by mouth 2 (two) times daily with a meal.   pantoprazole 20 MG tablet Commonly known as: PROTONIX Take 1 tablet (20 mg total) by mouth daily for 14 days.   predniSONE 20 MG tablet Commonly known as: DELTASONE Take 40 mg for a week, then 30 mg for a week then 20 mg for a week, then 10 mg for a week, then 5 mg for a week   traZODone 100 MG tablet Commonly known as: DESYREL Take 1 tablet (100 mg total) by mouth at bedtime.   Vitamin D (Ergocalciferol) 1.25 MG (50000 UNIT) Caps capsule Commonly known as: DRISDOL Take 1 capsule (50,000 Units total) by mouth every 7 (seven) days.        Allergies  Allergen Reactions   Onion Anaphylaxis   Flagyl [Metronidazole] Other (See Comments)    Makes her feel like she's dying   Metoclopramide Hcl Other (See Comments)  cramping    Consultations: Gastroenterology   Procedures/Studies: CT ANGIO GI BLEED  Result Date: 07/24/2022 CLINICAL DATA:  Rectal bleeding since this past Monday. History of ulcerative colitis. EXAM: CTA ABDOMEN AND PELVIS WITHOUT AND WITH CONTRAST TECHNIQUE: Multidetector CT imaging of the abdomen and pelvis was performed using the standard protocol during bolus administration of intravenous contrast. Multiplanar reconstructed images and MIPs were  obtained and reviewed to evaluate the vascular anatomy. RADIATION DOSE REDUCTION: This exam was performed according to the departmental dose-optimization program which includes automated exposure control, adjustment of the mA and/or kV according to patient size and/or use of iterative reconstruction technique. CONTRAST:  138mL OMNIPAQUE IOHEXOL 350 MG/ML SOLN COMPARISON:  07/25/2020; 09/17/2018 FINDINGS: VASCULAR Aorta: There is no significant atherosclerotic plaque within the abdominal aorta. Widely patent without a hemodynamically significant narrowing. The abdominal aorta is of normal caliber. No abdominal aortic dissection or perivascular stranding. Celiac: Widely patent without hemodynamically significant narrowing. Conventional branching pattern. SMA: Widely patent without hemodynamically significant narrowing. Conventional branching pattern. The distal tributaries of the SMA are widely patent without discrete intraluminal filling defect to suggest distal embolism. Renals: Solitary bilaterally; the bilateral renal arteries are of normal caliber and widely patent without hemodynamically significant narrowing. No vessel irregularity to suggest FMD. IMA: Widely patent without hemodynamically significant narrowing. Inflow: The bilateral common, external and internal iliac arteries are of normal caliber and widely patent without hemodynamically significant narrowing. Proximal Outflow: The bilateral common and imaged portions of the bilateral deep and superficial femoral arteries are of normal caliber and widely patent without hemodynamically significant narrowing. Veins: The IVC and pelvic venous systems appear widely patent. Review of the MIP images confirms the above findings. _________________________________________________________ NON-VASCULAR Lower chest: Limited visualization of the lower thorax demonstrates minimal dependent subpleural ground-glass atelectasis. No discrete focal airspace opacities. No  pleural effusion. Normal heart size.  No pericardial effusion. Hepatobiliary: There is mild diffuse decreased attenuation of the hepatic parenchyma on this postcontrast examination suggestive of hepatic steatosis. No discrete hepatic lesions. Normal appearance of the gallbladder given degree of distention. No radiopaque gallstones. No intra or extrahepatic biliary ductal dilatation. No ascites. Pancreas: Normal appearance of the pancreas. Spleen: Normal appearance of the spleen. Adrenals/Urinary Tract: There is symmetric enhancement of the bilateral kidneys. No evidence of nephrolithiasis on this postcontrast examination. No discrete renal lesions. No urine obstruction or perinephric stranding. Normal appearance of the bilateral adrenal glands. There is mild thickening of the urinary bladder wall, potentially accentuated due to underdistention. Stomach/Bowel: There are no discrete areas of intraluminal contrast extravasation to suggest acute GI bleeding. Suspected circumferential wall thickening involving the distal aspect of the sigmoid colon and rectum. No associated mesenteric stranding. Moderate colonic stool burden without evidence of enteric obstruction. Normal appearance of the terminal ileum and retrocecal appendix. Suspected small hiatal hernia. No pneumoperitoneum, pneumatosis or portal venous gas. Lymphatic: No bulky retroperitoneal, mesenteric, pelvic or inguinal lymphadenopathy. Reproductive: Normal appearance of the pelvic organs. No discrete adnexal lesion. No free fluid in the pelvic cul-de-sac. Other: Tiny mesenteric fat containing periumbilical hernia. Minimal amount of subcutaneous edema about the midline of the low back. Musculoskeletal: No acute or aggressive osseous abnormalities. Mild degenerative change of the bilateral hips with joint space loss, subchondral sclerosis and osteophytosis. IMPRESSION: 1. No discrete areas of intraluminal contrast extravasation to suggest acute GI bleeding. 2.  Suspected circumferential wall thickening involving the rectum and distal sigmoid colon, similar to prior examination performed 07/2020, nonspecific though could be seen in the setting of provided  history of a localized inflammatory colitis. Clinical correlation is advised. Further evaluation with colonoscopy could be performed as indicated. 3. Moderate colonic stool burden without evidence of enteric obstruction. 4. Small hiatal hernia. 5. Suspected hepatic steatosis. Correlation with LFTs is advised. 6. No significant atherosclerotic plaque within a normal caliber abdominal aorta. Electronically Signed   By: Sandi Mariscal M.D.   On: 07/24/2022 13:33   (Echo, Carotid, EGD, Colonoscopy, ERCP)    Subjective: No complaints  Discharge Exam: Vitals:   07/27/22 2240 07/28/22 0551  BP: 137/72 127/65  Pulse: 66 68  Resp: 18 17  Temp: 97.8 F (36.6 C) 97.8 F (36.6 C)  SpO2: 97% 98%   Vitals:   07/27/22 0727 07/27/22 1548 07/27/22 2240 07/28/22 0551  BP: 112/62 111/64 137/72 127/65  Pulse: 66 70 66 68  Resp: 16 16 18 17   Temp: 98 F (36.7 C) 97.9 F (36.6 C) 97.8 F (36.6 C) 97.8 F (36.6 C)  TempSrc: Oral Oral Oral Oral  SpO2: 98% 98% 97% 98%  Weight:      Height:        General: Pt is alert, awake, not in acute distress Cardiovascular: RRR, S1/S2 +, no rubs, no gallops Respiratory: CTA bilaterally, no wheezing, no rhonchi Abdominal: Soft, NT, ND, bowel sounds + Extremities: no edema, no cyanosis    The results of significant diagnostics from this hospitalization (including imaging, microbiology, ancillary and laboratory) are listed below for reference.     Microbiology: Recent Results (from the past 240 hour(s))  Urine Culture (for pregnant, neutropenic or urologic patients or patients with an indwelling urinary catheter)     Status: None   Collection Time: 07/25/22  4:18 AM   Specimen: Urine, Clean Catch  Result Value Ref Range Status   Specimen Description URINE, CLEAN  CATCH  Final   Special Requests NONE  Final   Culture   Final    NO GROWTH Performed at [facility], [place] [address], [place], [place] [address]    Report Status 07/26/2022 FINAL  Final     Labs: BNP (last 3 results) No results for input(s): "BNP" in the last 8760 hours. Basic Metabolic Panel: Recent Labs  Lab 07/24/22 1100 07/25/22 0331  NA 139 136  K 3.4* 4.1  CL 108 107  CO2 22 22  GLUCOSE 101* 133*  BUN 14 8  CREATININE 0.86 0.65  CALCIUM 9.1 8.8*   Liver Function Tests: Recent Labs  Lab 07/24/22 1100  AST 17  ALT 16  ALKPHOS 85  BILITOT 0.4  PROT 7.1  ALBUMIN 4.0   Recent Labs  Lab 07/24/22 1100  LIPASE 32   No results for input(s): "AMMONIA" in the last 168 hours. CBC: Recent Labs  Lab 07/24/22 1100 07/24/22 1859 07/25/22 0331  WBC 5.0  --  6.8  NEUTROABS 2.9  --   --   HGB 13.6 14.5 13.1  HCT 39.5 41.9 38.0  MCV 96.3  --  93.4  PLT 260  --  293   Cardiac Enzymes: No results for input(s): "CKTOTAL", "CKMB", "CKMBINDEX", "TROPONINI" in the last 168 hours. BNP: Invalid input(s): "POCBNP" CBG: No results for input(s): "GLUCAP" in the last 168 hours. D-Dimer No results for input(s): "DDIMER" in the last 72 hours. Hgb A1c No results for input(s): "HGBA1C" in the last 72 hours. Lipid Profile No results for input(s): "CHOL", "HDL", "LDLCALC", "TRIG", "CHOLHDL", "LDLDIRECT" in the last 72 hours. Thyroid function studies No results for input(s): "TSH", "T4TOTAL", "  T3FREE", "THYROIDAB" in the last 72 hours.  Invalid input(s): "FREET3" Anemia work up No results for input(s): "VITAMINB12", "FOLATE", "FERRITIN", "TIBC", "IRON", "RETICCTPCT" in the last 72 hours. Urinalysis    Component Value Date/Time   COLORURINE YELLOW 07/24/2022 1145   APPEARANCEUR HAZY (A) 07/24/2022 1145   LABSPEC 1.024 07/24/2022 1145   PHURINE 5.0 07/24/2022 1145   GLUCOSEU NEGATIVE 07/24/2022 1145   HGBUR NEGATIVE 07/24/2022 1145   HGBUR negative 07/24/2009  1338   BILIRUBINUR NEGATIVE 07/24/2022 1145   BILIRUBINUR Negative 09/28/2019 0843   KETONESUR NEGATIVE 07/24/2022 1145   PROTEINUR NEGATIVE 07/24/2022 1145   UROBILINOGEN 0.2 09/28/2019 0843   UROBILINOGEN 0.2 08/17/2014 0935   NITRITE NEGATIVE 07/24/2022 1145   LEUKOCYTESUR SMALL (A) 07/24/2022 1145   Sepsis Labs Recent Labs  Lab 07/24/22 1100 07/25/22 0331  WBC 5.0 6.8   Microbiology Recent Results (from the past 240 hour(s))  Urine Culture (for pregnant, neutropenic or urologic patients or patients with an indwelling urinary catheter)     Status: None   Collection Time: 07/25/22  4:18 AM   Specimen: Urine, Clean Catch  Result Value Ref Range Status   Specimen Description URINE, CLEAN CATCH  Final   Special Requests NONE  Final   Culture   Final    NO GROWTH Performed at [facility], [place] [address], [place], [place] [address]    Report Status 07/26/2022 FINAL  Final    SIGNED:   Charlynne Cousins, MD  [facility] 07/28/2022, 7:47 AM Pager   If 7PM-7AM, please contact night-coverage [email or web address] Password [email or web address]

## 2022-07-29 LAB — SURGICAL PATHOLOGY

## 2022-07-29 LAB — CALPROTECTIN, FECAL: Calprotectin, Fecal: 4530 ug/g — ABNORMAL HIGH (ref 0–120)

## 2022-08-04 LAB — QUANTIFERON-TB GOLD PLUS (RQFGPL)
QuantiFERON Mitogen Value: 0.28 IU/mL
QuantiFERON Nil Value: 0.21 IU/mL
QuantiFERON TB1 Ag Value: 1.18 IU/mL
QuantiFERON TB2 Ag Value: 0.22 IU/mL

## 2022-08-04 LAB — QUANTIFERON-TB GOLD PLUS: QuantiFERON-TB Gold Plus: POSITIVE — AB

## 2022-08-19 ENCOUNTER — Other Ambulatory Visit: Payer: Self-pay | Admitting: Obstetrics and Gynecology

## 2022-08-19 ENCOUNTER — Ambulatory Visit
Admission: RE | Admit: 2022-08-19 | Discharge: 2022-08-19 | Disposition: A | Discharge status: Home / Self Care | Payer: No Typology Code available for payment source | Source: Ambulatory Visit | Attending: Obstetrics and Gynecology | Admitting: Obstetrics and Gynecology

## 2022-08-19 DIAGNOSIS — R7611 Nonspecific reaction to tuberculin skin test without active tuberculosis: Secondary | ICD-10-CM
